# Patient Record
Sex: Female | Born: 1990 | Race: Black or African American | Hispanic: No | Marital: Single | Smoking: Never smoker
Health system: Southern US, Community
[De-identification: ages and names within clinical notes are randomized; demographics above are authoritative.]

## PROBLEM LIST (undated history)

## (undated) DIAGNOSIS — Z789 Other specified health status: Secondary | ICD-10-CM

## (undated) DIAGNOSIS — T783XXA Angioneurotic edema, initial encounter: Secondary | ICD-10-CM

## (undated) HISTORY — DX: Other specified health status: Z78.9

## (undated) HISTORY — PX: LUNG TRANSPLANT, DOUBLE: SHX704

## (undated) HISTORY — PX: ARTHROSCOPIC REPAIR ACL: SUR80

## (undated) HISTORY — DX: Angioneurotic edema, initial encounter: T78.3XXA

---
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
## 2012-09-10 ENCOUNTER — Other Ambulatory Visit: Payer: Self-pay | Admitting: Nurse Practitioner

## 2013-05-05 ENCOUNTER — Encounter: Payer: Self-pay | Admitting: Family Medicine

## 2013-05-05 ENCOUNTER — Ambulatory Visit (INDEPENDENT_AMBULATORY_CARE_PROVIDER_SITE_OTHER): Payer: 59 | Admitting: Family Medicine

## 2013-05-05 VITALS — BP 110/68 | Temp 98.8°F | Ht 66.0 in | Wt 160.4 lb

## 2013-05-05 DIAGNOSIS — J019 Acute sinusitis, unspecified: Secondary | ICD-10-CM

## 2013-05-05 MED ORDER — CEFPROZIL 500 MG PO TABS: 500.0000 mg | ORAL_TABLET | Freq: Two times a day (BID) | ORAL | Status: DC

## 2013-05-05 NOTE — Progress Notes (Signed)
   Subjective:    Patient ID: Madison SaugerJazmaine Decker, female    DOB: 04/12/1991, 22 y.o.   MRN: [id number]  Sinusitis This is a new problem. The current episode started in the past 7 days. The problem is unchanged. There has been no fever. The pain is moderate. Associated symptoms include congestion, headaches and a sore throat. Past treatments include oral decongestants. The treatment provided no relief.   Coughing, nasal discharge, headache, sore throat No high fever No body  achres   Review of Systems  HENT: Positive for congestion and sore throat.   Neurological: Positive for headaches.       Objective:   Physical Exam  Nursing note and vitals reviewed. Constitutional: She appears well-developed.  HENT:  Head: Normocephalic.  Nose: Nose normal.  Mouth/Throat: Oropharynx is clear and moist. No oropharyngeal exudate.  Neck: Neck supple.  Cardiovascular: Normal rate and normal heart sounds.   No murmur heard. Pulmonary/Chest: Effort normal and breath sounds normal. She has no wheezes.  Lymphadenopathy:    She has no cervical adenopathy.  Skin: Skin is warm and dry.          Assessment & Plan:  Sinusitis-antibiotics prescribed warning signs discussed followup if ongoing troubles

## 2013-05-22 ENCOUNTER — Encounter: Payer: Self-pay | Admitting: Nurse Practitioner

## 2013-05-22 ENCOUNTER — Ambulatory Visit (INDEPENDENT_AMBULATORY_CARE_PROVIDER_SITE_OTHER): Payer: 59 | Admitting: Nurse Practitioner

## 2013-05-22 VITALS — BP 94/60 | HR 60 | Ht 65.5 in | Wt 158.0 lb

## 2013-05-22 DIAGNOSIS — Z01419 Encounter for gynecological examination (general) (routine) without abnormal findings: Secondary | ICD-10-CM

## 2013-05-22 DIAGNOSIS — Z23 Encounter for immunization: Secondary | ICD-10-CM

## 2013-05-22 DIAGNOSIS — Z0000 Encounter for general adult medical examination without abnormal findings: Secondary | ICD-10-CM

## 2013-05-22 MED ORDER — NORGESTIMATE-ETH ESTRADIOL 0.25-35 MG-MCG PO TABS: 1.0000 | ORAL_TABLET | Freq: Every day | ORAL | Status: DC

## 2013-05-29 ENCOUNTER — Encounter: Payer: Self-pay | Admitting: Nurse Practitioner

## 2013-05-29 NOTE — Progress Notes (Signed)
   Subjective:    Patient ID: Madison SaugerJazmaine Decker, female    DOB: 11/12/1990, 23 y.o.   MRN: [id number]  HPI presents for her wellness checkup. Is currently off her birth control pills. Regular menses lasting 5 days with the first 2 days very heavy with cramping. Is currently in college studying nursing. No visual problems. Regular dental exams. Has had a total of one sexual partner, none recently. Used condoms.    Review of Systems  Constitutional: Negative for activity change, appetite change and fatigue.  HENT: Negative for dental problem, ear pain, sinus pressure and sore throat.   Respiratory: Negative for cough, chest tightness, shortness of breath and wheezing.   Cardiovascular: Negative for chest pain.  Gastrointestinal: Negative for nausea, vomiting, abdominal pain, diarrhea and constipation.  Genitourinary: Positive for menstrual problem. Negative for dysuria, urgency, frequency, vaginal discharge, difficulty urinating, genital sores and pelvic pain.  Psychiatric/Behavioral: Negative for sleep disturbance and decreased concentration.       Objective:   Physical Exam  Vitals reviewed. Constitutional: She is oriented to person, place, and time. She appears well-developed. No distress.  HENT:  Right Ear: External ear normal.  Left Ear: External ear normal.  Mouth/Throat: Oropharynx is clear and moist.  Neck: Normal range of motion. Neck supple. No tracheal deviation present. No thyromegaly present.  Cardiovascular: Normal rate, regular rhythm and normal heart sounds.  Exam reveals no gallop.   No murmur heard. Pulmonary/Chest: Effort normal and breath sounds normal.  Abdominal: Soft. She exhibits no distension. There is no tenderness.  Musculoskeletal: She exhibits no edema.  Lymphadenopathy:    She has no cervical adenopathy.  Neurological: She is alert and oriented to person, place, and time.  Skin: Skin is warm and dry. No rash noted.  Psychiatric: She has a normal mood and  affect. Her behavior is normal.   GU and breast exams deferred. Patient denies any problems. Defers STD testing.        Assessment & Plan:  Well woman exam  Need for prophylactic vaccination and inoculation against other viral diseases(V04.89) - Plan: HPV vaccine quadravalent 3 dose IM  DUB  Recommend healthy diet and regular exercise. Daily vitamin D and calcium supplementation. Discussed safe sex issues. Meds ordered this encounter  Medications  . norgestimate-ethinyl estradiol (ORTHO-CYCLEN,SPRINTEC,PREVIFEM) 0.25-35 MG-MCG tablet    Sig: Take 1 tablet by mouth daily. Start first Sunday after next period begins    Dispense:  1 Package    Refill:  11    Order Specific Question:  Supervising Provider    Answer:  Merlyn AlbertLUKING, WILLIAM S [[id number]]   Call back if any problems. Next physical in one year.

## 2013-10-17 ENCOUNTER — Emergency Department (HOSPITAL_COMMUNITY): Payer: No Typology Code available for payment source

## 2013-10-17 ENCOUNTER — Encounter (HOSPITAL_COMMUNITY): Payer: Self-pay | Admitting: Emergency Medicine

## 2013-10-17 ENCOUNTER — Emergency Department (HOSPITAL_COMMUNITY)
Admission: EM | Admit: 2013-10-17 | Discharge: 2013-10-17 | Disposition: A | Discharge status: Home / Self Care | Payer: No Typology Code available for payment source | Attending: Emergency Medicine | Admitting: Emergency Medicine

## 2013-10-17 DIAGNOSIS — Y92410 Unspecified street and highway as the place of occurrence of the external cause: Secondary | ICD-10-CM | POA: Insufficient documentation

## 2013-10-17 DIAGNOSIS — S81809A Unspecified open wound, unspecified lower leg, initial encounter: Principal | ICD-10-CM

## 2013-10-17 DIAGNOSIS — Y9389 Activity, other specified: Secondary | ICD-10-CM | POA: Insufficient documentation

## 2013-10-17 DIAGNOSIS — S81009A Unspecified open wound, unspecified knee, initial encounter: Secondary | ICD-10-CM | POA: Insufficient documentation

## 2013-10-17 DIAGNOSIS — Z9889 Other specified postprocedural states: Secondary | ICD-10-CM | POA: Insufficient documentation

## 2013-10-17 DIAGNOSIS — S0993XA Unspecified injury of face, initial encounter: Secondary | ICD-10-CM | POA: Insufficient documentation

## 2013-10-17 DIAGNOSIS — S91009A Unspecified open wound, unspecified ankle, initial encounter: Principal | ICD-10-CM

## 2013-10-17 DIAGNOSIS — Z23 Encounter for immunization: Secondary | ICD-10-CM | POA: Insufficient documentation

## 2013-10-17 DIAGNOSIS — S199XXA Unspecified injury of neck, initial encounter: Secondary | ICD-10-CM

## 2013-10-17 MED ORDER — CEPHALEXIN 500 MG PO CAPS: 500.0000 mg | ORAL_CAPSULE | Freq: Four times a day (QID) | ORAL | Status: DC

## 2013-10-17 MED ORDER — TETANUS-DIPHTH-ACELL PERTUSSIS 5-2.5-18.5 LF-MCG/0.5 IM SUSP
0.5000 mL | Freq: Once | INTRAMUSCULAR | Status: AC
  Administered 2013-10-17: 0.5 mL via INTRAMUSCULAR
  Filled 2013-10-17: qty 0.5

## 2013-10-17 MED ORDER — HYDROCODONE-ACETAMINOPHEN 5-325 MG PO TABS: 1.0000 | ORAL_TABLET | Freq: Four times a day (QID) | ORAL | Status: DC | PRN

## 2013-10-17 MED ORDER — OXYCODONE-ACETAMINOPHEN 5-325 MG PO TABS
1.0000 | ORAL_TABLET | Freq: Once | ORAL | Status: AC
  Administered 2013-10-17: 1 via ORAL
  Filled 2013-10-17: qty 1

## 2013-10-17 NOTE — ED Notes (Signed)
Resident at bedside.  
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
## 2013-10-17 NOTE — ED Notes (Signed)
VS are wnl. NAD noted. Pt given discharge instructions and prescriptions were reviewed. All questions answered. Knee immobilizer applied. Wet to dry dsgs applied to left knee and right hand. Pt discharge home with family by wheelchair.

## 2013-10-17 NOTE — ED Notes (Signed)
Patient transported to X-ray 
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
## 2013-10-18 ENCOUNTER — Telehealth (HOSPITAL_BASED_OUTPATIENT_CLINIC_OR_DEPARTMENT_OTHER): Payer: Self-pay

## 2013-10-18 NOTE — Telephone Encounter (Signed)
Pharmacy calling needs DEA # for Dr Imagene ShellerSteve Walton, Res.  FM has left message for Madison MamW. Cheek who handles sending lists of DEA #'s for Residents and LVM for her to call me.  Pharmacy updated. Pt may bring Rx back to ED.

## 2013-10-18 NOTE — ED Provider Notes (Signed)
I have personally seen and examined the patient.  I have discussed the plan of care with the resident.  I have reviewed the documentation on PMH/FH/Soc. History.  I have reviewed the documentation of the resident and agree.   JacubowiDoug Soutz, MD 10/18/13 [phone]

## 2014-05-23 ENCOUNTER — Other Ambulatory Visit: Payer: Self-pay | Admitting: Nurse Practitioner
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
## 2017-09-20 ENCOUNTER — Ambulatory Visit (INDEPENDENT_AMBULATORY_CARE_PROVIDER_SITE_OTHER): Payer: BLUE CROSS/BLUE SHIELD | Admitting: Nurse Practitioner

## 2017-09-20 ENCOUNTER — Encounter: Payer: Self-pay | Admitting: Nurse Practitioner

## 2017-09-20 VITALS — BP 118/80 | HR 109 | Ht 65.5 in | Wt 173.6 lb

## 2017-09-20 DIAGNOSIS — Z30011 Encounter for initial prescription of contraceptive pills: Secondary | ICD-10-CM | POA: Diagnosis not present

## 2017-09-20 DIAGNOSIS — Z113 Encounter for screening for infections with a predominantly sexual mode of transmission: Secondary | ICD-10-CM

## 2017-09-20 DIAGNOSIS — N921 Excessive and frequent menstruation with irregular cycle: Secondary | ICD-10-CM | POA: Insufficient documentation

## 2017-09-20 MED ORDER — NORETHIN ACE-ETH ESTRAD-FE 1-20 MG-MCG(24) PO TABS: 1.0000 | ORAL_TABLET | Freq: Every day | ORAL | 11 refills | Status: DC

## 2017-09-20 NOTE — Progress Notes (Signed)
Subjective:  Presents to discuss contraceptives. Has very irregular cycles with various flow and length. Her last 2 cycles were heavier and longer than usual. Some cramping and pain at times.  Last normal menses 2 weeks ago. Recently broke up with her partner of 2 years. Found out he had been cheating. Requesting screening for STDs as a precaution. No pelvic pain. Possible slight fever for a few days. White milky discharge, no color or odor. No urinary symptoms.   Objective:   BP 118/80 (BP Location: Left Arm)   Pulse (!) 109   Ht 5' 5.5" (1.664 m)   Wt 173 lb 9.6 oz (78.7 kg)   LMP 09/06/2017   SpO2 98%   BMI 28.45 kg/m  NAD. Alert, oriented. Lungs clear. Heart RRR. Abdomen soft, non tender.   Assessment:   Problem List Items Addressed This Visit      Other   Menorrhagia with irregular cycle    Other Visit Diagnoses    Encounter for initial prescription of contraceptive pills    -  Primary   Screening for STD (sexually transmitted disease)       Relevant Orders   HIV antibody   Hepatitis C antibody   RPR   Chlamydia/Gonococcus/Trichomonas, NAA       Plan:   Meds ordered this encounter  Medications  . Norethindrone Acetate-Ethinyl Estrad-FE (LOESTRIN 24 FE) 1-20 MG-MCG(24) tablet    Sig: Take 1 tablet by mouth daily.    Dispense:  1 Package    Refill:  11    Order Specific Question:   Supervising Provider    Answer:   Riccardo DubinLUKING, WILLIAM S [[id number]]  Reviewed contraceptive options.  Patient chooses to try oc's again. Has taken them without difficulty in the past.  STD screening pending. Warning signs reviewed especially PID symptoms. Reviewed safe sex issues. Recommend wellness exam and PAP.  Call back if any problems with oc's.

## 2017-09-21 LAB — HIV ANTIBODY (ROUTINE TESTING W REFLEX): HIV SCREEN 4TH GENERATION: NONREACTIVE

## 2017-09-21 LAB — HEPATITIS C ANTIBODY

## 2017-09-21 LAB — RPR: RPR: NONREACTIVE

## 2017-09-22 LAB — CHLAMYDIA/GONOCOCCUS/TRICHOMONAS, NAA
CHLAMYDIA BY NAA: POSITIVE — AB
Gonococcus by NAA: POSITIVE — AB
Trich vag by NAA: POSITIVE — AB

## 2017-09-22 LAB — SPECIMEN STATUS REPORT

## 2017-09-23 ENCOUNTER — Other Ambulatory Visit: Payer: Self-pay | Admitting: Nurse Practitioner

## 2017-09-23 MED ORDER — AZITHROMYCIN 250 MG PO TABS: ORAL_TABLET | ORAL | 0 refills | Status: DC

## 2017-09-23 MED ORDER — METRONIDAZOLE 500 MG PO TABS: ORAL_TABLET | ORAL | 0 refills | Status: DC

## 2017-09-23 MED ORDER — ONDANSETRON 4 MG PO TBDP: 4.0000 mg | ORAL_TABLET | Freq: Three times a day (TID) | ORAL | 0 refills | Status: DC | PRN

## 2017-09-23 NOTE — Addendum Note (Signed)
Addended by: Meredith LeedsSUTTON,  L on: 09/23/2017 11:22 AM   Modules accepted: Orders

## 2017-09-28 ENCOUNTER — Ambulatory Visit (INDEPENDENT_AMBULATORY_CARE_PROVIDER_SITE_OTHER): Payer: BLUE CROSS/BLUE SHIELD

## 2017-09-28 VITALS — Temp 99.3°F

## 2017-09-28 DIAGNOSIS — Z202 Contact with and (suspected) exposure to infections with a predominantly sexual mode of transmission: Secondary | ICD-10-CM

## 2017-09-28 MED ORDER — CEFTRIAXONE SODIUM 250 MG IJ SOLR
250.0000 mg | Freq: Once | INTRAMUSCULAR | Status: AC
  Administered 2017-09-28: 250 mg via INTRAMUSCULAR

## 2017-09-28 NOTE — Progress Notes (Signed)
Pt came in today for her rocephin 250 she denies having any pelvic pain or a fever.

## 2018-12-14 ENCOUNTER — Encounter: Payer: Self-pay | Admitting: Women's Health

## 2018-12-14 ENCOUNTER — Ambulatory Visit (INDEPENDENT_AMBULATORY_CARE_PROVIDER_SITE_OTHER): Payer: Self-pay | Admitting: Women's Health

## 2018-12-14 ENCOUNTER — Other Ambulatory Visit: Payer: Self-pay

## 2018-12-14 VITALS — BP 121/79 | HR 86 | Ht 66.0 in | Wt 189.5 lb

## 2018-12-14 DIAGNOSIS — Z3201 Encounter for pregnancy test, result positive: Secondary | ICD-10-CM

## 2018-12-14 DIAGNOSIS — Z3491 Encounter for supervision of normal pregnancy, unspecified, first trimester: Secondary | ICD-10-CM

## 2018-12-14 LAB — POCT URINE PREGNANCY: Preg Test, Ur: POSITIVE — AB

## 2018-12-14 MED ORDER — PROMETHAZINE HCL 25 MG PO TABS: 12.5000 mg | ORAL_TABLET | Freq: Four times a day (QID) | ORAL | 0 refills | Status: DC | PRN

## 2018-12-14 NOTE — Progress Notes (Signed)
   GYN VISIT Patient name: Madison Decker MRN [id number]  Date of birth: 1990-07-17 Chief Complaint:   Possible Pregnancy  History of Present Illness:   Madison Decker is a 28 y.o. G56P0 African American female at [redacted]w[redacted]d by LMP, being seen today for +HPT.  Currently self-pay, has applied for preg mcaid. Nausea, wants meds. Not taking pnv yet. Regular periods.   Patient's last menstrual period was 10/29/2018. Review of Systems:   Pertinent items are noted in HPI Denies fever/chills, dizziness, headaches, visual disturbances, fatigue, shortness of breath, chest pain, abdominal pain, vomiting, abnormal vaginal discharge/itching/odor/irritation, problems with periods, bowel movements, urination, or intercourse unless otherwise stated above.  Pertinent History Reviewed:  Reviewed past medical,surgical, social, obstetrical and family history.  Reviewed problem list, medications and allergies. Physical Assessment:   Vitals:   12/14/18 1418  BP: 121/79  Pulse: 86  Weight: 189 lb 8 oz (86 kg)  Height: 5\' 6"  (1.676 m)  Body mass index is 30.59 kg/m.       Physical Examination:   General appearance: alert, well appearing, and in no distress  Mental status: alert, oriented to person, place, and time  Skin: warm & dry   Cardiovascular: normal heart rate noted  Respiratory: normal respiratory effort, no distress  Abdomen: soft, non-tender   Pelvic: examination not indicated  Extremities: no edema   Results for orders placed or performed in visit on 12/14/18 (from the past 24 hour(s))  POCT urine pregnancy   Collection Time: 12/14/18  2:22 PM  Result Value Ref Range   Preg Test, Ur Positive (A) Negative    Assessment & Plan:  1) [redacted]w[redacted]d pregnant> by LMP, start pnv, rx phenergan, gave info for otc vit b6/unisom, get dating u/s scheduled  Meds:  Meds ordered this encounter  Medications  . promethazine (PHENERGAN) 25 MG tablet    Sig: Take 0.5-1 tablets (12.5-25 mg total) by mouth every 6  (six) hours as needed for nausea or vomiting.    Dispense:  30 tablet    Refill:  0    Order Specific Question:   Supervising Provider    Answer:   Tania Ade H [[id number]]    Orders Placed This Encounter  Procedures  . US OB Comp Less 14 Wks  . POCT urine pregnancy    Return for 1st available, dating u/s.  [place], [place] 12/14/2018 2:44 PM

## 2018-12-14 NOTE — Patient Instructions (Addendum)
Madison Decker, I greatly value your feedback.  If you receive a survey following your visit with [place] today, we appreciate you taking the time to fill it out.  Thanks, Knute Neu, CNM, WHNP-BC  Vitamin B6 (pyridoxine) 25mg  four times a day OR 50mg  twice a day Add Unisom (doxylamine) 12.5mg  (1/2 tab) in the morning, 12.5mg  6-8 hours later, then 25mg  every night if symptoms do not improve with Vitamin B6 alone    First Trimester of Pregnancy The first trimester of pregnancy is from week 1 until the end of week 13 (months 1 through 3). A week after a sperm fertilizes an egg, the egg will implant on the wall of the uterus. This embryo will begin to develop into a baby. Genes from you and your partner will form the baby. The female genes will determine whether the baby will be a boy or a girl. At 6-8 weeks, the eyes and face will be formed, and the heartbeat can be seen on ultrasound. At the end of 12 weeks, all the baby's organs will be formed. Now that you are pregnant, you will want to do everything you can to have a healthy baby. Two of the most important things are to get good prenatal care and to follow your health care provider's instructions. Prenatal care is all the medical care you receive before the baby's birth. This care will help prevent, find, and treat any problems during the pregnancy and childbirth. Body changes during your first trimester Your body goes through many changes during pregnancy. The changes vary from woman to woman.  You may gain or lose a couple of pounds at first.  You may feel sick to your stomach (nauseous) and you may throw up (vomit). If the vomiting is uncontrollable, call your health care provider.  You may tire easily.  You may develop headaches that can be relieved by medicines. All medicines should be approved by your health care provider.  You may urinate more often. Painful urination may mean you have a bladder infection.  You may develop heartburn as a  result of your pregnancy.  You may develop constipation because certain hormones are causing the muscles that push stool through your intestines to slow down.  You may develop hemorrhoids or swollen veins (varicose veins).  Your breasts may begin to grow larger and become tender. Your nipples may stick out more, and the tissue that surrounds them (areola) may become darker.  Your gums may bleed and may be sensitive to brushing and flossing.  Dark spots or blotches (chloasma, mask of pregnancy) may develop on your face. This will likely fade after the baby is born.  Your menstrual periods will stop.  You may have a loss of appetite.  You may develop cravings for certain kinds of food.  You may have changes in your emotions from day to day, such as being excited to be pregnant or being concerned that something may go wrong with the pregnancy and baby.  You may have more vivid and strange dreams.  You may have changes in your hair. These can include thickening of your hair, rapid growth, and changes in texture. Some women also have hair loss during or after pregnancy, or hair that feels dry or thin. Your hair will most likely return to normal after your baby is born.  What to expect at prenatal visits During a routine prenatal visit:  You will be weighed to make sure you and the baby are growing normally.  Your  blood pressure will be taken.  Your abdomen will be measured to track your baby's growth.  The fetal heartbeat will be listened to between weeks 10 and 14 of your pregnancy.  Test results from any previous visits will be discussed.  Your health care provider may ask you:  How you are feeling.  If you are feeling the baby move.  If you have had any abnormal symptoms, such as leaking fluid, bleeding, severe headaches, or abdominal cramping.  If you are using any tobacco products, including cigarettes, chewing tobacco, and electronic cigarettes.  If you have any  questions.  Other tests that may be performed during your first trimester include:  Blood tests to find your blood type and to check for the presence of any previous infections. The tests will also be used to check for low iron levels (anemia) and protein on red blood cells (Rh antibodies). Depending on your risk factors, or if you previously had diabetes during pregnancy, you may have tests to check for high blood sugar that affects pregnant women (gestational diabetes).  Urine tests to check for infections, diabetes, or protein in the urine.  An ultrasound to confirm the proper growth and development of the baby.  Fetal screens for spinal cord problems (spina bifida) and Down syndrome.  HIV (human immunodeficiency virus) testing. Routine prenatal testing includes screening for HIV, unless you choose not to have this test.  You may need other tests to make sure you and the baby are doing well.  Follow these instructions at home: Medicines  Follow your health care provider's instructions regarding medicine use. Specific medicines may be either safe or unsafe to take during pregnancy.  Take a prenatal vitamin that contains at least 600 micrograms (mcg) of folic acid.  If you develop constipation, try taking a stool softener if your health care provider approves. Eating and drinking  Eat a balanced diet that includes fresh fruits and vegetables, whole grains, good sources of protein such as meat, eggs, or tofu, and low-fat dairy. Your health care provider will help you determine the amount of weight gain that is right for you.  Avoid raw meat and uncooked cheese. These carry germs that can cause birth defects in the baby.  Eating four or five small meals rather than three large meals a day may help relieve nausea and vomiting. If you start to feel nauseous, eating a few soda crackers can be helpful. Drinking liquids between meals, instead of during meals, also seems to help ease nausea  and vomiting.  Limit foods that are high in fat and processed sugars, such as fried and sweet foods.  To prevent constipation: ? Eat foods that are high in fiber, such as fresh fruits and vegetables, whole grains, and beans. ? Drink enough fluid to keep your urine clear or pale yellow. Activity  Exercise only as directed by your health care provider. Most women can continue their usual exercise routine during pregnancy. Try to exercise for 30 minutes at least 5 days a week. Exercising will help you: ? Control your weight. ? Stay in shape. ? Be prepared for labor and delivery.  Experiencing pain or cramping in the lower abdomen or lower back is a good sign that you should stop exercising. Check with your health care provider before continuing with normal exercises.  Try to avoid standing for long periods of time. Move your legs often if you must stand in one place for a long time.  Avoid heavy lifting.  Wear  low-heeled shoes and practice good posture.  You may continue to have sex unless your health care provider tells you not to. Relieving pain and discomfort  Wear a good support bra to relieve breast tenderness.  Take warm sitz baths to soothe any pain or discomfort caused by hemorrhoids. Use hemorrhoid cream if your health care provider approves.  Rest with your legs elevated if you have leg cramps or low back pain.  If you develop varicose veins in your legs, wear support hose. Elevate your feet for 15 minutes, 3-4 times a day. Limit salt in your diet. Prenatal care  Schedule your prenatal visits by the twelfth week of pregnancy. They are usually scheduled monthly at first, then more often in the last 2 months before delivery.  Write down your questions. Take them to your prenatal visits.  Keep all your prenatal visits as told by your health care provider. This is important. Safety  Wear your seat belt at all times when driving.  Make a list of emergency phone numbers,  including numbers for family, friends, the hospital, and police and fire departments. General instructions  Ask your health care provider for a referral to a local prenatal education class. Begin classes no later than the beginning of month 6 of your pregnancy.  Ask for help if you have counseling or nutritional needs during pregnancy. Your health care provider can offer advice or refer you to specialists for help with various needs.  Do not use hot tubs, steam rooms, or saunas.  Do not douche or use tampons or scented sanitary pads.  Do not cross your legs for long periods of time.  Avoid cat litter boxes and soil used by cats. These carry germs that can cause birth defects in the baby and possibly loss of the fetus by miscarriage or stillbirth.  Avoid all smoking, herbs, alcohol, and medicines not prescribed by your health care provider. Chemicals in these products affect the formation and growth of the baby.  Do not use any products that contain nicotine or tobacco, such as cigarettes and e-cigarettes. If you need help quitting, ask your health care provider. You may receive counseling support and other resources to help you quit.  Schedule a dentist appointment. At home, brush your teeth with a soft toothbrush and be gentle when you floss. Contact a health care provider if:  You have dizziness.  You have mild pelvic cramps, pelvic pressure, or nagging pain in the abdominal area.  You have persistent nausea, vomiting, or diarrhea.  You have a bad smelling vaginal discharge.  You have pain when you urinate.  You notice increased swelling in your face, hands, legs, or ankles.  You are exposed to fifth disease or chickenpox.  You are exposed to [place] measles (rubella) and have never had it. Get help right away if:  You have a fever.  You are leaking fluid from your vagina.  You have spotting or bleeding from your vagina.  You have severe abdominal cramping or pain.   You have rapid weight gain or loss.  You vomit blood or material that looks like coffee grounds.  You develop a severe headache.  You have shortness of breath.  You have any kind of trauma, such as from a fall or a car accident. Summary  The first trimester of pregnancy is from week 1 until the end of week 13 (months 1 through 3).  Your body goes through many changes during pregnancy. The changes vary from woman to woman.  You will have routine prenatal visits. During those visits, your health care provider will examine you, discuss any test results you may have, and talk with you about how you are feeling. This information is not intended to replace advice given to you by your health care provider. Make sure you discuss any questions you have with your health care provider. Document Released: 03/24/2001 Document Revised: 03/11/2016 Document Reviewed: 03/11/2016 [employer] Interactive Patient Education  2017 [employer]

## 2018-12-16 ENCOUNTER — Ambulatory Visit (INDEPENDENT_AMBULATORY_CARE_PROVIDER_SITE_OTHER): Payer: Medicaid Other

## 2018-12-16 ENCOUNTER — Other Ambulatory Visit: Payer: Self-pay

## 2018-12-16 DIAGNOSIS — Z3491 Encounter for supervision of normal pregnancy, unspecified, first trimester: Secondary | ICD-10-CM

## 2018-12-16 DIAGNOSIS — Z3A01 Less than 8 weeks gestation of pregnancy: Secondary | ICD-10-CM | POA: Diagnosis not present

## 2018-12-16 DIAGNOSIS — O3680X0 Pregnancy with inconclusive fetal viability, not applicable or unspecified: Secondary | ICD-10-CM | POA: Diagnosis not present

## 2018-12-16 NOTE — Progress Notes (Signed)
[place] 6+6 wks,single IUP w/ys,positive fht 121 bpm,normal ovaries bilat,crl 7.7 mm

## 2019-01-26 ENCOUNTER — Other Ambulatory Visit: Payer: Self-pay | Admitting: Obstetrics & Gynecology

## 2019-01-26 DIAGNOSIS — Z3682 Encounter for antenatal screening for nuchal translucency: Secondary | ICD-10-CM

## 2019-01-27 ENCOUNTER — Other Ambulatory Visit (HOSPITAL_COMMUNITY)
Admission: RE | Admit: 2019-01-27 | Discharge: 2019-01-27 | Disposition: A | Discharge status: Home / Self Care | Payer: Medicaid Other | Source: Ambulatory Visit | Attending: Obstetrics & Gynecology | Admitting: Obstetrics & Gynecology

## 2019-01-27 ENCOUNTER — Other Ambulatory Visit: Payer: Self-pay

## 2019-01-27 ENCOUNTER — Ambulatory Visit (INDEPENDENT_AMBULATORY_CARE_PROVIDER_SITE_OTHER): Payer: Medicaid Other

## 2019-01-27 ENCOUNTER — Encounter: Payer: Self-pay | Admitting: Women's Health

## 2019-01-27 ENCOUNTER — Ambulatory Visit (INDEPENDENT_AMBULATORY_CARE_PROVIDER_SITE_OTHER): Payer: Medicaid Other | Admitting: Women's Health

## 2019-01-27 ENCOUNTER — Ambulatory Visit: Payer: Medicaid Other | Admitting: *Deleted

## 2019-01-27 VITALS — BP 113/84 | HR 84 | Wt 184.0 lb

## 2019-01-27 DIAGNOSIS — Z130 Encounter for screening for diseases of the blood and blood-forming organs and certain disorders involving the immune mechanism: Secondary | ICD-10-CM

## 2019-01-27 DIAGNOSIS — Z3A12 12 weeks gestation of pregnancy: Secondary | ICD-10-CM

## 2019-01-27 DIAGNOSIS — Z1379 Encounter for other screening for genetic and chromosomal anomalies: Secondary | ICD-10-CM

## 2019-01-27 DIAGNOSIS — Z3401 Encounter for supervision of normal first pregnancy, first trimester: Secondary | ICD-10-CM | POA: Diagnosis present

## 2019-01-27 DIAGNOSIS — Z23 Encounter for immunization: Secondary | ICD-10-CM | POA: Diagnosis not present

## 2019-01-27 DIAGNOSIS — Z363 Encounter for antenatal screening for malformations: Secondary | ICD-10-CM

## 2019-01-27 DIAGNOSIS — Z1371 Encounter for nonprocreative screening for genetic disease carrier status: Secondary | ICD-10-CM

## 2019-01-27 DIAGNOSIS — Z124 Encounter for screening for malignant neoplasm of cervix: Secondary | ICD-10-CM | POA: Diagnosis present

## 2019-01-27 DIAGNOSIS — Z1389 Encounter for screening for other disorder: Secondary | ICD-10-CM

## 2019-01-27 DIAGNOSIS — Z3682 Encounter for antenatal screening for nuchal translucency: Secondary | ICD-10-CM

## 2019-01-27 DIAGNOSIS — Z3400 Encounter for supervision of normal first pregnancy, unspecified trimester: Secondary | ICD-10-CM | POA: Insufficient documentation

## 2019-01-27 DIAGNOSIS — Z113 Encounter for screening for infections with a predominantly sexual mode of transmission: Secondary | ICD-10-CM

## 2019-01-27 DIAGNOSIS — Z331 Pregnant state, incidental: Secondary | ICD-10-CM

## 2019-01-27 DIAGNOSIS — Z36 Encounter for antenatal screening for chromosomal anomalies: Secondary | ICD-10-CM

## 2019-01-27 LAB — POCT URINALYSIS DIPSTICK OB
Blood, UA: NEGATIVE
Glucose, UA: NEGATIVE
Ketones, UA: NEGATIVE
Leukocytes, UA: NEGATIVE
Nitrite, UA: NEGATIVE
POC,PROTEIN,UA: NEGATIVE

## 2019-01-27 MED ORDER — BLOOD PRESSURE MONITOR MISC: 0 refills | Status: DC

## 2019-01-27 NOTE — Patient Instructions (Signed)
Madison Decker, I greatly value your feedback.  If you receive a survey following your visit with [place] today, we appreciate you taking the time to fill it out.  Thanks, Knute Neu, CNM, [facility]  [place]!!! It is now [place] [facility] ([place], [place] [address]) Entrance located off of [address] parking   Nausea & Vomiting  Have saltine crackers or pretzels by your bed and eat a few bites before you raise your head out of bed in the morning  Eat small frequent meals throughout the day instead of large meals  Drink plenty of fluids throughout the day to stay hydrated, just don't drink a lot of fluids with your meals.  This can make your stomach fill up faster making you feel sick  Do not brush your teeth right after you eat  Products with real ginger are good for nausea, like ginger ale and ginger hard candy Make sure it says made with real ginger!  Sucking on sour candy like lemon heads is also good for nausea  If your prenatal vitamins make you nauseated, take them at night so you will sleep through the nausea  Sea Bands  If you feel like you need medicine for the nausea & vomiting please let us know  If you are unable to keep any fluids or food down please let us know   Constipation  Drink plenty of fluid, preferably water, throughout the day  Eat foods high in fiber such as fruits, vegetables, and grains  Exercise, such as walking, is a good way to keep your bowels regular  Drink warm fluids, especially warm prune juice, or decaf coffee  Eat a 1/2 cup of real oatmeal (not instant), 1/2 cup applesauce, and 1/2-1 cup warm prune juice every day  If needed, you may take Colace (docusate sodium) stool softener once or twice a day to help keep the stool soft.   If you still are having problems with constipation, you may take Miralax once daily as needed to help keep your bowels regular.   Home Blood  Pressure Monitoring for Patients   Your provider has recommended that you check your blood pressure (BP) at least once a week at home. If you do not have a blood pressure cuff at home, one will be provided for you. Contact your provider if you have not received your monitor within 1 week.   Helpful Tips for Accurate Home Blood Pressure Checks  . Don't smoke, exercise, or drink caffeine 30 minutes before checking your BP . Use the restroom before checking your BP (a full bladder can raise your pressure) . Relax in a comfortable upright chair . Feet on the ground . Left arm resting comfortably on a flat surface at the level of your heart . Legs uncrossed . Back supported . Sit quietly and don't talk . Place the cuff on your bare arm . Adjust snuggly, so that only two fingertips can fit between your skin and the top of the cuff . Check 2 readings separated by at least one minute . Keep a log of your BP readings . For a visual, please reference this diagram: http://ccnc.care/bpdiagram  Provider Name: [facility]     Phone: [phone]  Zone 1: ALL CLEAR  Continue to monitor your symptoms:  . BP reading is less than 140 (top number) or less than 90 (bottom number)  . No right upper stomach pain .  No headaches or seeing spots . No feeling nauseated or throwing up . No swelling in face and hands  Zone 2: CAUTION Call your doctor's office for any of the following:  . BP reading is greater than 140 (top number) or greater than 90 (bottom number)  . Stomach pain under your ribs in the middle or right side . Headaches or seeing spots . Feeling nauseated or throwing up . Swelling in face and hands  Zone 3: EMERGENCY  Seek immediate medical care if you have any of the following:  . BP reading is greater than160 (top number) or greater than 110 (bottom number) . Severe headaches not improving with Tylenol . Serious difficulty catching your breath . Any worsening symptoms from Zone  2    First Trimester of Pregnancy The first trimester of pregnancy is from week 1 until the end of week 12 (months 1 through 3). A week after a sperm fertilizes an egg, the egg will implant on the wall of the uterus. This embryo will begin to develop into a baby. Genes from you and your partner are forming the baby. The female genes determine whether the baby is a boy or a girl. At 6-8 weeks, the eyes and face are formed, and the heartbeat can be seen on ultrasound. At the end of 12 weeks, all the baby's organs are formed.  Now that you are pregnant, you will want to do everything you can to have a healthy baby. Two of the most important things are to get good prenatal care and to follow your health care provider's instructions. Prenatal care is all the medical care you receive before the baby's birth. This care will help prevent, find, and treat any problems during the pregnancy and childbirth. BODY CHANGES Your body goes through many changes during pregnancy. The changes vary from woman to woman.   You may gain or lose a couple of pounds at first.  You may feel sick to your stomach (nauseous) and throw up (vomit). If the vomiting is uncontrollable, call your health care provider.  You may tire easily.  You may develop headaches that can be relieved by medicines approved by your health care provider.  You may urinate more often. Painful urination may mean you have a bladder infection.  You may develop heartburn as a result of your pregnancy.  You may develop constipation because certain hormones are causing the muscles that push waste through your intestines to slow down.  You may develop hemorrhoids or swollen, bulging veins (varicose veins).  Your breasts may begin to grow larger and become tender. Your nipples may stick out more, and the tissue that surrounds them (areola) may become darker.  Your gums may bleed and may be sensitive to brushing and flossing.  Dark spots or blotches  (chloasma, mask of pregnancy) may develop on your face. This will likely fade after the baby is born.  Your menstrual periods will stop.  You may have a loss of appetite.  You may develop cravings for certain kinds of food.  You may have changes in your emotions from day to day, such as being excited to be pregnant or being concerned that something may go wrong with the pregnancy and baby.  You may have more vivid and strange dreams.  You may have changes in your hair. These can include thickening of your hair, rapid growth, and changes in texture. Some women also have hair loss during or after pregnancy, or hair that feels dry  or thin. Your hair will most likely return to normal after your baby is born. WHAT TO EXPECT AT YOUR PRENATAL VISITS During a routine prenatal visit:  You will be weighed to make sure you and the baby are growing normally.  Your blood pressure will be taken.  Your abdomen will be measured to track your baby's growth.  The fetal heartbeat will be listened to starting around week 10 or 12 of your pregnancy.  Test results from any previous visits will be discussed. Your health care provider may ask you:  How you are feeling.  If you are feeling the baby move.  If you have had any abnormal symptoms, such as leaking fluid, bleeding, severe headaches, or abdominal cramping.  If you have any questions. Other tests that may be performed during your first trimester include:  Blood tests to find your blood type and to check for the presence of any previous infections. They will also be used to check for low iron levels (anemia) and Rh antibodies. Later in the pregnancy, blood tests for diabetes will be done along with other tests if problems develop.  Urine tests to check for infections, diabetes, or protein in the urine.  An ultrasound to confirm the proper growth and development of the baby.  An amniocentesis to check for possible genetic problems.  Fetal  screens for spina bifida and Down syndrome.  You may need other tests to make sure you and the baby are doing well. HOME CARE INSTRUCTIONS  Medicines  Follow your health care provider's instructions regarding medicine use. Specific medicines may be either safe or unsafe to take during pregnancy.  Take your prenatal vitamins as directed.  If you develop constipation, try taking a stool softener if your health care provider approves. Diet  Eat regular, well-balanced meals. Choose a variety of foods, such as meat or vegetable-based protein, fish, milk and low-fat dairy products, vegetables, fruits, and whole grain breads and cereals. Your health care provider will help you determine the amount of weight gain that is right for you.  Avoid raw meat and uncooked cheese. These carry germs that can cause birth defects in the baby.  Eating four or five small meals rather than three large meals a day may help relieve nausea and vomiting. If you start to feel nauseous, eating a few soda crackers can be helpful. Drinking liquids between meals instead of during meals also seems to help nausea and vomiting.  If you develop constipation, eat more high-fiber foods, such as fresh vegetables or fruit and whole grains. Drink enough fluids to keep your urine clear or pale yellow. Activity and Exercise  Exercise only as directed by your health care provider. Exercising will help you:  Control your weight.  Stay in shape.  Be prepared for labor and delivery.  Experiencing pain or cramping in the lower abdomen or low back is a good sign that you should stop exercising. Check with your health care provider before continuing normal exercises.  Try to avoid standing for long periods of time. Move your legs often if you must stand in one place for a long time.  Avoid heavy lifting.  Wear low-heeled shoes, and practice good posture.  You may continue to have sex unless your health care provider directs you  otherwise. Relief of Pain or Discomfort  Wear a good support bra for breast tenderness.    Take warm sitz baths to soothe any pain or discomfort caused by hemorrhoids. Use hemorrhoid cream if your  health care provider approves.    Rest with your legs elevated if you have leg cramps or low back pain.  If you develop varicose veins in your legs, wear support hose. Elevate your feet for 15 minutes, 3-4 times a day. Limit salt in your diet. Prenatal Care  Schedule your prenatal visits by the twelfth week of pregnancy. They are usually scheduled monthly at first, then more often in the last 2 months before delivery.  Write down your questions. Take them to your prenatal visits.  Keep all your prenatal visits as directed by your health care provider. Safety  Wear your seat belt at all times when driving.  Make a list of emergency phone numbers, including numbers for family, friends, the hospital, and police and fire departments. General Tips  Ask your health care provider for a referral to a local prenatal education class. Begin classes no later than at the beginning of month 6 of your pregnancy.  Ask for help if you have counseling or nutritional needs during pregnancy. Your health care provider can offer advice or refer you to specialists for help with various needs.  Do not use hot tubs, steam rooms, or saunas.  Do not douche or use tampons or scented sanitary pads.  Do not cross your legs for long periods of time.  Avoid cat litter boxes and soil used by cats. These carry germs that can cause birth defects in the baby and possibly loss of the fetus by miscarriage or stillbirth.  Avoid all smoking, herbs, alcohol, and medicines not prescribed by your health care provider. Chemicals in these affect the formation and growth of the baby.  Schedule a dentist appointment. At home, brush your teeth with a soft toothbrush and be gentle when you floss. SEEK MEDICAL CARE IF:   You have  dizziness.  You have mild pelvic cramps, pelvic pressure, or nagging pain in the abdominal area.  You have persistent nausea, vomiting, or diarrhea.  You have a bad smelling vaginal discharge.  You have pain with urination.  You notice increased swelling in your face, hands, legs, or ankles. SEEK IMMEDIATE MEDICAL CARE IF:   You have a fever.  You are leaking fluid from your vagina.  You have spotting or bleeding from your vagina.  You have severe abdominal cramping or pain.  You have rapid weight gain or loss.  You vomit blood or material that looks like coffee grounds.  You are exposed to [place] measles and have never had them.  You are exposed to fifth disease or chickenpox.  You develop a severe headache.  You have shortness of breath.  You have any kind of trauma, such as from a fall or a car accident. Document Released: 03/24/2001 Document Revised: 08/14/2013 Document Reviewed: 02/07/2013 [facility] Patient Information 2015 [place], [place]. This information is not intended to replace advice given to you by your health care provider. Make sure you discuss any questions you have with your health care provider.  Coronavirus (COVID-19) Are you at risk?  Are you at risk for the Coronavirus (COVID-19)?  To be considered HIGH RISK for Coronavirus (COVID-19), you have to meet the following criteria:  . Traveled to [place], [place], [place], [place] or [place]; or in the [place] to [place], [place], [place], or [place]; and have fever, cough, and shortness of breath within the last 2 weeks of travel OR . Been in close contact with a person diagnosed with COVID-19 within the last 2 weeks and  have fever, cough, and shortness of breath . IF YOU DO NOT MEET THESE CRITERIA, YOU ARE CONSIDERED LOW RISK FOR COVID-19.  What to do if you are HIGH RISK for COVID-19?  Marland Kitchen If you are having a medical emergency, call 911. . Seek medical care right away. Before you go to a  doctor's office, urgent care or emergency department, call ahead and tell them about your recent travel, contact with someone diagnosed with COVID-19, and your symptoms. You should receive instructions from your physician's office regarding next steps of care.  . When you arrive at healthcare provider, tell the healthcare staff immediately you have returned from visiting [place], [place], [place], [place] or [place]; or traveled in the [place] to [place], [place], [place], or [place]; in the last two weeks or you have been in close contact with a person diagnosed with COVID-19 in the last 2 weeks.   . Tell the health care staff about your symptoms: fever, cough and shortness of breath. . After you have been seen by a medical provider, you will be either: o Tested for (COVID-19) and discharged home on quarantine except to seek medical care if symptoms worsen, and asked to  - Stay home and avoid contact with others until you get your results (4-5 days)  - Avoid travel on public transportation if possible (such as bus, train, or airplane) or o Sent to the Emergency Department by EMS for evaluation, COVID-19 testing, and possible admission depending on your condition and test results.  What to do if you are LOW RISK for COVID-19?  Reduce your risk of any infection by using the same precautions used for avoiding the common cold or flu:  Marland Kitchen Wash your hands often with soap and warm water for at least 20 seconds.  If soap and water are not readily available, use an alcohol-based hand sanitizer with at least 60% alcohol.  . If coughing or sneezing, cover your mouth and nose by coughing or sneezing into the elbow areas of your shirt or coat, into a tissue or into your sleeve (not your hands). . Avoid shaking hands with others and consider head nods or verbal greetings only. . Avoid touching your eyes, nose, or mouth with unwashed hands.  . Avoid close contact with people who are sick. . Avoid  places or events with large numbers of people in one location, like concerts or sporting events. . Carefully consider travel plans you have or are making. . If you are planning any travel outside or inside the [place], visit the CDC's Travelers' Health webpage for the latest health notices. . If you have some symptoms but not all symptoms, continue to monitor at home and seek medical attention if your symptoms worsen. . If you are having a medical emergency, call 911.   [place] / e-Visit: [email or web address]         [facility]: [facility]: [id number]                   [facility]: [phone]

## 2019-01-27 NOTE — Progress Notes (Signed)
INITIAL OBSTETRICAL VISIT Patient name: Madison Decker MRN [id number]  Date of birth: 04-01-1991 Chief Complaint:   Initial Prenatal Visit (nt/it)  History of Present Illness:   Madison Decker is a 28 y.o. G1P0 African American female at [redacted]w[redacted]d by LMP c/w 6wk u/s, with an Estimated Date of Delivery: 08/05/19 being seen today for her initial obstetrical visit.   Her obstetrical history is significant for primigravida.   Today she reports no complaints.  Patient's last menstrual period was 10/29/2018. Review of Systems:   Pertinent items are noted in HPI Denies cramping/contractions, leakage of fluid, vaginal bleeding, abnormal vaginal discharge w/ itching/odor/irritation, headaches, visual changes, shortness of breath, chest pain, abdominal pain, severe nausea/vomiting, or problems with urination or bowel movements unless otherwise stated above.  Pertinent History Reviewed:  Reviewed past medical,surgical, social, obstetrical and family history.  Reviewed problem list, medications and allergies. OB History  Gravida Para Term Preterm AB Living  1            SAB TAB Ectopic Multiple Live Births               # Outcome Date GA Lbr Len/2nd Weight Sex Delivery Anes PTL Lv  1 Current            Physical Assessment:   Vitals:   01/27/19 1044  BP: 113/84  Pulse: 84  Weight: 184 lb (83.5 kg)  Body mass index is 29.7 kg/m.       Physical Examination by Camelia Eng, SNM  General appearance - well appearing, and in no distress  Mental status - alert, oriented to person, place, and time  Psych:  She has a normal mood and affect  Skin - warm and dry, normal color, no suspicious lesions noted  Chest - effort normal, all lung fields clear to auscultation bilaterally  Heart - normal rate and regular rhythm  Abdomen - soft, nontender  Extremities:  No swelling or varicosities noted  Pelvic - VULVA: normal appearing vulva with no masses, tenderness or lesions  VAGINA: normal appearing  vagina with normal color and discharge, no lesions  CERVIX: normal appearing cervix without discharge or lesions, no CMT  Thin prep pap is done w/ HR HPV cotesting  TODAY'S NT [place] 12+6 wks,measurements c/w dates,normal ovaries bilat,posterior placenta,crl 68.71 mm,NB present,NT 1.3 mm,fhr 160 bpm  Results for orders placed or performed in visit on 01/27/19 (from the past 24 hour(s))  POC Urinalysis Dipstick OB   Collection Time: 01/27/19 10:59 AM  Result Value Ref Range   Color, UA     Clarity, UA     Glucose, UA Negative Negative   Bilirubin, UA     Ketones, UA neg    Spec Grav, UA     Blood, UA neg    pH, UA     POC,PROTEIN,UA Negative Negative, Trace, Small (1+), Moderate (2+), Large (3+), 4+   Urobilinogen, UA     Nitrite, UA neg    Leukocytes, UA Negative Negative   Appearance     Odor      Assessment & Plan:  1) Low-Risk Pregnancy G1P0 at [redacted]w[redacted]d with an Estimated Date of Delivery: 08/05/19   2) Initial OB visit  Meds:  Meds ordered this encounter  Medications  . Blood Pressure Monitor MISC    Sig: For regular home bp monitoring during pregnancy    Dispense:  1 each    Refill:  0    Z34.00    Initial labs obtained Continue prenatal  vitamins Reviewed n/v relief measures and warning s/s to report Reviewed recommended weight gain based on pre-gravid BMI Encouraged well-balanced diet Genetic Screening discussed: requested nt/it, maternit21 Cystic fibrosis, SMA, Fragile X screening discussed requested Ultrasound discussed; fetal survey: requested CCNC completed>PCM not here, form faxed The nature of [place] for [employer] with multiple MDs and other Advanced Practice Providers was explained to patient; also emphasized that fellows, residents, and students are part of our team. Does not have home bp cuff. Rx faxed to [facility]. Check bp weekly, let us know if >140/90.   Follow-up: Return in about 6 weeks (around 03/10/2019) for LROB, 2nd IT, BH:ALPFXTK, in  person, CNM.   Orders Placed This Encounter  Procedures  . Urine Culture  . US OB Comp + 14 Wk  . Flu Vaccine QUAD 36+ mos IM  . Obstetric Panel, Including HIV  . Urinalysis, Routine w reflex microscopic  . Sickle cell screen  . Integrated 1  . MaterniT 21 plus Core, Blood  . Inheritest Core(CF97,SMA,FraX)  . POC Urinalysis Dipstick OB    Roma Schanz CNM, [facility] 01/27/2019 11:37 AM

## 2019-01-27 NOTE — Progress Notes (Signed)
[place] 12+6 wks,measurements c/w dates,normal ovaries bilat,posterior placenta,crl 68.71 mm,NB present,NT 1.3 mm,fhr 160 bpm

## 2019-01-28 LAB — OBSTETRIC PANEL, INCLUDING HIV
Antibody Screen: NEGATIVE
Basophils Absolute: 0 10*3/uL (ref 0.0–0.2)
Basos: 0 %
EOS (ABSOLUTE): 0 10*3/uL (ref 0.0–0.4)
Eos: 0 %
HIV Screen 4th Generation wRfx: NONREACTIVE
Hematocrit: 40.1 % (ref 34.0–46.6)
Hemoglobin: 13.9 g/dL (ref 11.1–15.9)
Hepatitis B Surface Ag: NEGATIVE
Immature Grans (Abs): 0 10*3/uL (ref 0.0–0.1)
Immature Granulocytes: 0 %
Lymphocytes Absolute: 1.2 10*3/uL (ref 0.7–3.1)
Lymphs: 21 %
MCH: 29 pg (ref 26.6–33.0)
MCHC: 34.7 g/dL (ref 31.5–35.7)
MCV: 84 fL (ref 79–97)
Monocytes Absolute: 0.5 10*3/uL (ref 0.1–0.9)
Monocytes: 9 %
Neutrophils Absolute: 3.9 10*3/uL (ref 1.4–7.0)
Neutrophils: 70 %
Platelets: 226 10*3/uL (ref 150–450)
RBC: 4.79 x10E6/uL (ref 3.77–5.28)
RDW: 12.6 % (ref 11.7–15.4)
RPR Ser Ql: NONREACTIVE
Rh Factor: POSITIVE
Rubella Antibodies, IGG: 2.37 index (ref >0.99)
WBC: 5.7 10*3/uL (ref 3.4–10.8)

## 2019-01-29 LAB — URINE CULTURE

## 2019-01-29 LAB — SPECIMEN STATUS REPORT

## 2019-01-29 LAB — SICKLE CELL SCREEN: Sickle Cell Screen: NEGATIVE

## 2019-02-02 LAB — MATERNIT 21 PLUS CORE, BLOOD
Fetal Fraction: 6
Result (T21): NEGATIVE
Trisomy 13 (Patau syndrome): NEGATIVE
Trisomy 18 (Edwards syndrome): NEGATIVE
Trisomy 21 (Down syndrome): NEGATIVE

## 2019-02-02 LAB — CYTOLOGY - PAP
Chlamydia: NEGATIVE
Comment: NEGATIVE
Comment: NEGATIVE
Comment: NORMAL
Diagnosis: NEGATIVE
High risk HPV: NEGATIVE
Neisseria Gonorrhea: NEGATIVE

## 2019-02-03 LAB — INHERITEST CORE(CF97,SMA,FRAX)

## 2019-03-13 ENCOUNTER — Encounter: Payer: Self-pay | Admitting: Women's Health

## 2019-03-13 ENCOUNTER — Ambulatory Visit (INDEPENDENT_AMBULATORY_CARE_PROVIDER_SITE_OTHER): Payer: Medicaid Other

## 2019-03-13 ENCOUNTER — Ambulatory Visit (INDEPENDENT_AMBULATORY_CARE_PROVIDER_SITE_OTHER): Payer: Medicaid Other | Admitting: Women's Health

## 2019-03-13 ENCOUNTER — Other Ambulatory Visit: Payer: Self-pay

## 2019-03-13 VITALS — BP 115/75 | HR 80 | Wt 182.0 lb

## 2019-03-13 DIAGNOSIS — Z3401 Encounter for supervision of normal first pregnancy, first trimester: Secondary | ICD-10-CM

## 2019-03-13 DIAGNOSIS — Z3A19 19 weeks gestation of pregnancy: Secondary | ICD-10-CM

## 2019-03-13 DIAGNOSIS — Z363 Encounter for antenatal screening for malformations: Secondary | ICD-10-CM

## 2019-03-13 DIAGNOSIS — Z1379 Encounter for other screening for genetic and chromosomal anomalies: Secondary | ICD-10-CM

## 2019-03-13 DIAGNOSIS — Z3402 Encounter for supervision of normal first pregnancy, second trimester: Secondary | ICD-10-CM

## 2019-03-13 NOTE — Patient Instructions (Signed)
Madison Decker, I greatly value your feedback.  If you receive a survey following your visit with [place] today, we appreciate you taking the time to fill it out.  Thanks, Madison Decker, CNM, [facility]  [place]!!! It is now [place] at [facility] ([place], [place] [address]) Entrance located off of [address] parking   Go to [email or web address] to register for FREE online childbirth classes  [place] Pediatricians/Family Doctors:  [facility] [place] [phone]                 [place] [phone] (usually not accepting new patients unless you have family there already, you are always welcome to call and ask)       [facility] Department [phone]       [facility] Pediatricians/Family Doctors:   [facility]: [phone]  [facility]: [phone]  [facility]: [place] Doctors:   [facility]: [facility]: [place]:  [place]: [phone]    Home Blood Pressure Monitoring for Patients   Your provider has recommended that you check your blood pressure (BP) at least once a week at home. If you do not have a blood pressure cuff at home, one will be provided for you. Contact your provider if you have not received your monitor within 1 week.   Helpful Tips for Accurate Home Blood Pressure Checks   Don't smoke, exercise, or drink caffeine 30 minutes before checking your BP  Use the restroom before checking your BP (a full bladder can raise your pressure)  Relax in a comfortable upright chair  Feet on the ground  Left arm resting comfortably on a flat surface at the level of your heart  Legs uncrossed  Back supported  Sit quietly and don't talk  Place the cuff  on your bare arm  Adjust snuggly, so that only two fingertips can fit between your skin and the top of the cuff  Check 2 readings separated by at least one minute  Keep a log of your BP readings  For a visual, please reference this diagram: http://ccnc.care/bpdiagram  Provider Name: [facility]     Phone: [phone]  Zone 1: ALL CLEAR  Continue to monitor your symptoms:   BP reading is less than 140 (top number) or less than 90 (bottom number)   No right upper stomach pain  No headaches or seeing spots  No feeling nauseated or throwing up  No swelling in face and hands  Zone 2: CAUTION Call your doctor's office for any of the following:   BP reading is greater than 140 (top number) or greater than 90 (bottom number)   Stomach pain under your ribs in the middle or right side  Headaches or seeing spots  Feeling nauseated or throwing up  Swelling in face and hands  Zone 3: EMERGENCY  Seek immediate medical care if you have any of the following:   BP reading is greater than160 (top number) or greater than 110 (bottom number)  Severe headaches not improving with Tylenol  Serious difficulty catching your breath  Any worsening symptoms from Zone 2     Second Trimester of Pregnancy The second trimester is from week 14 through week 27 (months 4 through 6). The second trimester is often  a time when you feel your best. Your body has adjusted to being pregnant, and you begin to feel better physically. Usually, morning sickness has lessened or quit completely, you may have more energy, and you may have an increase in appetite. The second trimester is also a time when the fetus is growing rapidly. At the end of the sixth month, the fetus is about 9 inches long and weighs about 1 pounds. You will likely begin to feel the baby move (quickening) between 16 and 20 weeks of pregnancy. Body changes during your second trimester Your body continues to go through many changes  during your second trimester. The changes vary from woman to woman.  Your weight will continue to increase. You will notice your lower abdomen bulging out.  You may begin to get stretch marks on your hips, abdomen, and breasts.  You may develop headaches that can be relieved by medicines. The medicines should be approved by your health care provider.  You may urinate more often because the fetus is pressing on your bladder.  You may develop or continue to have heartburn as a result of your pregnancy.  You may develop constipation because certain hormones are causing the muscles that push waste through your intestines to slow down.  You may develop hemorrhoids or swollen, bulging veins (varicose veins).  You may have back pain. This is caused by: ? Weight gain. ? Pregnancy hormones that are relaxing the joints in your pelvis. ? A shift in weight and the muscles that support your balance.  Your breasts will continue to grow and they will continue to become tender.  Your gums may bleed and may be sensitive to brushing and flossing.  Dark spots or blotches (chloasma, mask of pregnancy) may develop on your face. This will likely fade after the baby is born.  A dark line from your belly button to the pubic area (linea nigra) may appear. This will likely fade after the baby is born.  You may have changes in your hair. These can include thickening of your hair, rapid growth, and changes in texture. Some women also have hair loss during or after pregnancy, or hair that feels dry or thin. Your hair will most likely return to normal after your baby is born.  What to expect at prenatal visits During a routine prenatal visit:  You will be weighed to make sure you and the fetus are growing normally.  Your blood pressure will be taken.  Your abdomen will be measured to track your baby's growth.  The fetal heartbeat will be listened to.  Any test results from the previous visit will be  discussed.  Your health care provider may ask you:  How you are feeling.  If you are feeling the baby move.  If you have had any abnormal symptoms, such as leaking fluid, bleeding, severe headaches, or abdominal cramping.  If you are using any tobacco products, including cigarettes, chewing tobacco, and electronic cigarettes.  If you have any questions.  Other tests that may be performed during your second trimester include:  Blood tests that check for: ? Low iron levels (anemia). ? High blood sugar that affects pregnant women (gestational diabetes) between 66 and 28 weeks. ? Rh antibodies. This is to check for a protein on red blood cells (Rh factor).  Urine tests to check for infections, diabetes, or protein in the urine.  An ultrasound to confirm the proper growth and development of the baby.  An amniocentesis to check  for possible genetic problems.  Fetal screens for spina bifida and Down syndrome.  HIV (human immunodeficiency virus) testing. Routine prenatal testing includes screening for HIV, unless you choose not to have this test.  Follow these instructions at home: Medicines  Follow your health care provider's instructions regarding medicine use. Specific medicines may be either safe or unsafe to take during pregnancy.  Take a prenatal vitamin that contains at least 600 micrograms (mcg) of folic acid.  If you develop constipation, try taking a stool softener if your health care provider approves. Eating and drinking  Eat a balanced diet that includes fresh fruits and vegetables, whole grains, good sources of protein such as meat, eggs, or tofu, and low-fat dairy. Your health care provider will help you determine the amount of weight gain that is right for you.  Avoid raw meat and uncooked cheese. These carry germs that can cause birth defects in the baby.  If you have low calcium intake from food, talk to your health care provider about whether you should take a  daily calcium supplement.  Limit foods that are high in fat and processed sugars, such as fried and sweet foods.  To prevent constipation: ? Drink enough fluid to keep your urine clear or pale yellow. ? Eat foods that are high in fiber, such as fresh fruits and vegetables, whole grains, and beans. Activity  Exercise only as directed by your health care provider. Most women can continue their usual exercise routine during pregnancy. Try to exercise for 30 minutes at least 5 days a week. Stop exercising if you experience uterine contractions.  Avoid heavy lifting, wear low heel shoes, and practice good posture.  A sexual relationship may be continued unless your health care provider directs you otherwise. Relieving pain and discomfort  Wear a good support bra to prevent discomfort from breast tenderness.  Take warm sitz baths to soothe any pain or discomfort caused by hemorrhoids. Use hemorrhoid cream if your health care provider approves.  Rest with your legs elevated if you have leg cramps or low back pain.  If you develop varicose veins, wear support hose. Elevate your feet for 15 minutes, 3-4 times a day. Limit salt in your diet. Prenatal Care  Write down your questions. Take them to your prenatal visits.  Keep all your prenatal visits as told by your health care provider. This is important. Safety  Wear your seat belt at all times when driving.  Make a list of emergency phone numbers, including numbers for family, friends, the hospital, and police and fire departments. General instructions  Ask your health care provider for a referral to a local prenatal education class. Begin classes no later than the beginning of month 6 of your pregnancy.  Ask for help if you have counseling or nutritional needs during pregnancy. Your health care provider can offer advice or refer you to specialists for help with various needs.  Do not use hot tubs, steam rooms, or saunas.  Do not  douche or use tampons or scented sanitary pads.  Do not cross your legs for long periods of time.  Avoid cat litter boxes and soil used by cats. These carry germs that can cause birth defects in the baby and possibly loss of the fetus by miscarriage or stillbirth.  Avoid all smoking, herbs, alcohol, and unprescribed drugs. Chemicals in these products can affect the formation and growth of the baby.  Do not use any products that contain nicotine or tobacco, such as cigarettes  and e-cigarettes. If you need help quitting, ask your health care provider.  Visit your dentist if you have not gone yet during your pregnancy. Use a soft toothbrush to brush your teeth and be gentle when you floss. Contact a health care provider if:  You have dizziness.  You have mild pelvic cramps, pelvic pressure, or nagging pain in the abdominal area.  You have persistent nausea, vomiting, or diarrhea.  You have a bad smelling vaginal discharge.  You have pain when you urinate. Get help right away if:  You have a fever.  You are leaking fluid from your vagina.  You have spotting or bleeding from your vagina.  You have severe abdominal cramping or pain.  You have rapid weight gain or weight loss.  You have shortness of breath with chest pain.  You notice sudden or extreme swelling of your face, hands, ankles, feet, or legs.  You have not felt your baby move in over an hour.  You have severe headaches that do not go away when you take medicine.  You have vision changes. Summary  The second trimester is from week 14 through week 27 (months 4 through 6). It is also a time when the fetus is growing rapidly.  Your body goes through many changes during pregnancy. The changes vary from woman to woman.  Avoid all smoking, herbs, alcohol, and unprescribed drugs. These chemicals affect the formation and growth your baby.  Do not use any tobacco products, such as cigarettes, chewing tobacco, and  e-cigarettes. If you need help quitting, ask your health care provider.  Contact your health care provider if you have any questions. Keep all prenatal visits as told by your health care provider. This is important. This information is not intended to replace advice given to you by your health care provider. Make sure you discuss any questions you have with your health care provider. Document Released: 03/24/2001 Document Revised: 09/05/2015 Document Reviewed: 05/31/2012 Elsevier Interactive Patient Education  2017 [place] FLU! Because you are pregnant, we at [facility], along with the Centers for Disease Control (CDC), recommend that you receive the flu vaccine to protect yourself and your baby from the flu. The flu is more likely to cause severe illness in pregnant women than in women of reproductive age who are not pregnant. Changes in the immune system, heart, and lungs during pregnancy make pregnant women (and women up to two weeks postpartum) more prone to severe illness from flu, including illness resulting in hospitalization. Flu also may be harmful for a pregnant womans developing baby. A common flu symptom is fever, which may be associated with neural tube defects and other adverse outcomes for a developing baby. Getting vaccinated can also help protect a baby after birth from flu. (Mom passes antibodies onto the developing baby during her pregnancy.)  A Flu Vaccine is the Best Protection Against Flu Getting a flu vaccine is the first and most important step in protecting against flu. Pregnant women should get a flu shot and not the live attenuated influenza vaccine (LAIV), also known as nasal spray flu vaccine. Flu vaccines given during pregnancy help protect both the mother and her baby from flu. Vaccination has been shown to reduce the risk of flu-associated acute respiratory infection in pregnant women by up to one-half. A 2018 study showed  that getting a flu shot reduced a pregnant womans risk of being hospitalized with flu by an average of 40  percent. Pregnant women who get a flu vaccine are also helping to protect their babies from flu illness for the first several months after their birth, when they are too young to get vaccinated.   A Long Record of Safety for Flu Shots in Pregnant Women Flu shots have been given to millions of pregnant women over many years with a good safety record. There is a lot of evidence that flu vaccines can be given safely during pregnancy; though these data are limited for the first trimester. The CDC recommends that pregnant women get vaccinated during any trimester of their pregnancy. It is very important for pregnant women to get the flu shot.   Other Preventive Actions In addition to getting a flu shot, pregnant women should take the same everyday preventive actions the CDC recommends of everyone, including covering coughs, washing hands often, and avoiding people who are sick.  Symptoms and Treatment If you get sick with flu symptoms call your doctor right away. There are antiviral drugs that can treat flu illness and prevent serious flu complications. The CDC recommends prompt treatment for people who have influenza infection or suspected influenza infection and who are at high risk of serious flu complications, such as people with asthma, diabetes (including gestational diabetes), or heart disease. Early treatment of influenza in hospitalized pregnant women has been shown to reduce the length of the hospital stay.  Symptoms Flu symptoms include fever, cough, sore throat, runny or stuffy nose, body aches, headache, chills and fatigue. Some people may also have vomiting and diarrhea. People may be infected with the flu and have respiratory symptoms without a fever.  Early Treatment is Important for Pregnant Women Treatment should begin as soon as possible because antiviral drugs work best when  started early (within 48 hours after symptoms start). Antiviral drugs can make your flu illness milder and make you feel better faster. They may also prevent serious health problems that can result from flu illness. Oral oseltamivir (Tamiflu) is the preferred treatment for pregnant women because it has the most studies available to suggest that it is safe and beneficial. Antiviral drugs require a prescription from your provider. Having a fever caused by flu infection or other infections early in pregnancy may be linked to birth defects in a baby. In addition to taking antiviral drugs, pregnant women who get a fever should treat their fever with Tylenol (acetaminophen) and contact their provider immediately.  When to Seek Emergency Medical Care If you are pregnant and have any of these signs, seek care immediately:  Difficulty breathing or shortness of breath  Pain or pressure in the chest or abdomen  Sudden dizziness  Confusion  Severe or persistent vomiting  High fever that is not responding to Tylenol (or store brand equivalent)  Decreased or no movement of your baby  [email or web address].[email or web address]

## 2019-03-13 NOTE — Progress Notes (Signed)
[place] 19+2 wks,breech,posterior placenta gr 0,normal ovaries,cx 3.6 cm,fhr 166 bpm,svp of fluid 3.3 cm,LVEICF  2.1 mm,EFW 281 g 41%,anatomy complete

## 2019-03-13 NOTE — Progress Notes (Signed)
   LOW-RISK PREGNANCY VISIT Patient name: Madison Decker MRN [id number]  Date of birth: 04/08/1991 Chief Complaint:   Routine Prenatal Visit  History of Present Illness:   [name] is a 28 y.o. G1P0 female at [redacted]w[redacted]d with an Estimated Date of Delivery: 08/05/19 being seen today for ongoing management of a low-risk pregnancy.  Today she reports no complaints. Contractions: Not present. Vag. Bleeding: None.  Movement: Present. denies leaking of fluid. Review of Systems:   Pertinent items are noted in HPI Denies abnormal vaginal discharge w/ itching/odor/irritation, headaches, visual changes, shortness of breath, chest pain, abdominal pain, severe nausea/vomiting, or problems with urination or bowel movements unless otherwise stated above. Pertinent History Reviewed:  Reviewed past medical,surgical, social, obstetrical and family history.  Reviewed problem list, medications and allergies. Physical Assessment:   Vitals:   03/13/19 0908  BP: 115/75  Pulse: 80  Weight: 182 lb (82.6 kg)  Body mass index is 29.38 kg/m.        Physical Examination:   General appearance: Well appearing, and in no distress  Mental status: Alert, oriented to person, place, and time  Skin: Warm & dry  Cardiovascular: Normal heart rate noted  Respiratory: Normal respiratory effort, no distress  Abdomen: Soft, gravid, nontender  Pelvic: Cervical exam deferred         Extremities: Edema: None  Fetal Status: Fetal Heart Rate (bpm): 166  u/s   Movement: Present    [place] 19+2 wks,breech,posterior placenta gr 0,normal ovaries,cx 3.6 cm,fhr 166 bpm,svp of fluid 3.3 cm,LVEICF  2.1 mm,EFW 281 g 41%,anatomy complete  Chaperone: n/a    No results found for this or any previous visit (from the past 24 hour(s)).  Assessment & Plan:  1) Low-risk pregnancy G1P0 at [redacted]w[redacted]d with an Estimated Date of Delivery: 08/05/19   2) Fetal isolated LVEICF, materit21 neg, discussed, printed info given   Meds: No orders of the  defined types were placed in this encounter.  Labs/procedures today: AFP (didn't do 1st IT), anatomy u/s  Plan:  Continue routine obstetrical care  Next visit: prefers online    Reviewed: Preterm labor symptoms and general obstetric precautions including but not limited to vaginal bleeding, contractions, leaking of fluid and fetal movement were reviewed in detail with the patient.  All questions were answered. Has home bp cuff. Check bp weekly, let us know if >140/90.   Follow-up: Return in about 4 weeks (around 04/10/2019) for [place], [place], CNM.  Orders Placed This Encounter  Procedures  . AFP TETRA   Roma Schanz CNM, [facility] 03/13/2019 9:26 AM

## 2019-03-15 LAB — AFP TETRA
DIA Mom Value: 1.45
DIA Value (EIA): 227.28 pg/mL
DSR (By Age)    1 IN: 936
DSR (Second Trimester) 1 IN: 9519
Gestational Age: 19.2 WEEKS
MSAFP Mom: 1.89
MSAFP: 94 ng/mL
MSHCG Mom: 0.83
MSHCG: 18439 m[IU]/mL
Maternal Age At EDD: 26.8 yr
Osb Risk: 2068
T18 (By Age): 1:3648 {titer}
Test Results:: NEGATIVE
Weight: 182 [lb_av]
uE3 Mom: 1.19
uE3 Value: 2.16 ng/mL

## 2019-03-24 ENCOUNTER — Other Ambulatory Visit: Payer: Self-pay

## 2019-03-24 DIAGNOSIS — Z20822 Contact with and (suspected) exposure to covid-19: Secondary | ICD-10-CM

## 2019-03-25 LAB — NOVEL CORONAVIRUS, NAA: SARS-CoV-2, NAA: NOT DETECTED

## 2019-04-10 ENCOUNTER — Other Ambulatory Visit: Payer: Self-pay

## 2019-04-10 ENCOUNTER — Encounter: Payer: Self-pay | Admitting: Advanced Practice Midwife

## 2019-04-10 ENCOUNTER — Telehealth (INDEPENDENT_AMBULATORY_CARE_PROVIDER_SITE_OTHER): Payer: Medicaid Other | Admitting: Advanced Practice Midwife

## 2019-04-10 VITALS — BP 125/64 | HR 90 | Ht 66.0 in

## 2019-04-10 DIAGNOSIS — Z3A23 23 weeks gestation of pregnancy: Secondary | ICD-10-CM | POA: Diagnosis not present

## 2019-04-10 DIAGNOSIS — Z3402 Encounter for supervision of normal first pregnancy, second trimester: Secondary | ICD-10-CM | POA: Diagnosis not present

## 2019-04-10 NOTE — Progress Notes (Signed)
   TELEHEALTH VIRTUAL OBSTETRICS VISIT ENCOUNTER NOTE  I connected with Madison Decker on 04/10/19 at  1:50 PM EST by telephone at home and verified that I am speaking with the correct person using two identifiers.   I discussed the limitations, risks, security and privacy concerns of performing an evaluation and management service by telephone and the availability of in person appointments. I also discussed with the patient that there may be a patient responsible charge related to this service. The patient expressed understanding and agreed to proceed.  Subjective:  Madison Decker is a 27 y.o. G1P0 at [redacted]w[redacted]d being followed for ongoing prenatal care.  She is currently monitored for the following issues for this low-risk pregnancy and has Menorrhagia with irregular cycle and Supervision of normal first pregnancy on their problem list.  Patient reports no complaints. Reports fetal movement. Denies any contractions, bleeding or leaking of fluid.   The following portions of the patient's history were reviewed and updated as appropriate: allergies, current medications, past family history, past medical history, past social history, past surgical history and problem list.   Objective:   General:  Alert, oriented and cooperative.   Mental Status: Normal mood and affect perceived. Normal judgment and thought content.  Rest of physical exam deferred due to type of encounter  Assessment and Plan:  Pregnancy: G1P0 at [redacted]w[redacted]d 1. Encounter for supervision of normal first pregnancy in second trimester   Preterm labor symptoms and general obstetric precautions including but not limited to vaginal bleeding, contractions, leaking of fluid and fetal movement were reviewed in detail with the patient.  I discussed the assessment and treatment plan with the patient. The patient was provided an opportunity to ask questions and all were answered. The patient agreed with the plan and demonstrated an understanding  of the instructions. The patient was advised to call back or seek an in-person office evaluation/go to [facility] at [facility] for any urgent or concerning symptoms. Please refer to After Visit Summary for other counseling recommendations.   I provided 10 minutes of non-face-to-face time during this encounter.  Return in about 4 weeks (around 05/08/2019) for PN2/LROB.  No future appointments.  Christin Fudge, [place] for [employer], [place]

## 2019-04-10 NOTE — Patient Instructions (Signed)

## 2019-04-12 ENCOUNTER — Ambulatory Visit: Payer: Medicaid Other | Attending: Internal Medicine

## 2019-04-12 ENCOUNTER — Other Ambulatory Visit: Payer: Self-pay

## 2019-04-12 DIAGNOSIS — Z20822 Contact with and (suspected) exposure to covid-19: Secondary | ICD-10-CM

## 2019-04-13 LAB — NOVEL CORONAVIRUS, NAA: SARS-CoV-2, NAA: NOT DETECTED

## 2019-04-14 NOTE — L&D Delivery Note (Signed)
Delivery Note At 1:36 PM a non-viable female was delivered via Vaginal, Spontaneous (Presentation: Left Occiput Anterior).  APGAR: 8, 9; weight pending  .   Placenta status: Spontaneous, Intact.  Cord: 3 vessels with the following complications: None.  Cord pH: NA  Anesthesia: Epidural Episiotomy: None Lacerations:  Periclitoral; repaired by Dr. Adrian Blackwater Suture Repair: 4.0 monocryl Est. Blood Loss (mL):  127  Mom to postpartum.  Baby to Couplet care / Skin to Skin.  Charlesetta Garibaldi  07/17/2019, 2:08 PM

## 2019-05-08 ENCOUNTER — Ambulatory Visit (INDEPENDENT_AMBULATORY_CARE_PROVIDER_SITE_OTHER): Payer: Medicaid Other | Admitting: Advanced Practice Midwife

## 2019-05-08 ENCOUNTER — Other Ambulatory Visit: Payer: Medicaid Other

## 2019-05-08 ENCOUNTER — Other Ambulatory Visit: Payer: Self-pay

## 2019-05-08 VITALS — BP 99/60 | HR 86

## 2019-05-08 DIAGNOSIS — Z3A27 27 weeks gestation of pregnancy: Secondary | ICD-10-CM

## 2019-05-08 DIAGNOSIS — Z3402 Encounter for supervision of normal first pregnancy, second trimester: Secondary | ICD-10-CM

## 2019-05-08 DIAGNOSIS — Z1389 Encounter for screening for other disorder: Secondary | ICD-10-CM

## 2019-05-08 DIAGNOSIS — Z331 Pregnant state, incidental: Secondary | ICD-10-CM

## 2019-05-08 DIAGNOSIS — Z23 Encounter for immunization: Secondary | ICD-10-CM

## 2019-05-08 LAB — POCT URINALYSIS DIPSTICK OB
Blood, UA: NEGATIVE
Leukocytes, UA: NEGATIVE
Nitrite, UA: NEGATIVE
POC,PROTEIN,UA: NEGATIVE

## 2019-05-08 NOTE — Patient Instructions (Signed)
Chastity Culp, I greatly value your feedback.  If you receive a survey following your visit with us today, we appreciate you taking the time to fill it out.  Thanks, Kim , CNM  [facility]!!! It is now [facility] ([address] [place], [place] [address]) Entrance located off of [address] Free 24/7 valet parking    Go to [email or web address] to register for FREE online childbirth classes   Call the office ([phone]) or go to [facility] if:  You begin to have strong, frequent contractions  Your water breaks.  Sometimes it is a big gush of fluid, sometimes it is just a trickle that keeps getting your panties wet or running down your legs  You have vaginal bleeding.  It is normal to have a small amount of spotting if your cervix was checked.   You don't feel your baby moving like normal.  If you don't, get you something to eat and drink and lay down and focus on feeling your baby move.  You should feel at least 10 movements in 2 hours.  If you don't, you should call the office or go to [facility].    Tdap Vaccine  It is recommended that you get the Tdap vaccine during the third trimester of EACH pregnancy to help protect your baby from getting pertussis (whooping cough)  27-36 weeks is the BEST time to do this so that you can pass the protection on to your baby. During pregnancy is better than after pregnancy, but if you are unable to get it during pregnancy it will be offered at the hospital.   You can get this vaccine with us, at the health department, your family doctor, or some local pharmacies  Everyone who will be around your baby should also be up-to-date on their vaccines before the baby comes. Adults (who are not pregnant) only need 1 dose of Tdap during adulthood.   [place] Pediatricians/Family Doctors:  [facility] [phone]            [facility] [phone]                  [facility] [phone] (usually not accepting new patients unless you have family there already, you are always welcome to call and ask)       [facility] [phone]       [place] Pediatricians/Family Doctors:   [facility]: [phone]  [facility]: [phone]  [facility]: [phone]  [place] Family Doctors:   [facility]: [phone]   [facility]: [phone]  [place] Family Doctors:  [facility]: [phone]   Home Blood Pressure Monitoring for Patients   Your provider has recommended that you check your blood pressure (BP) at least once a week at home. If you do not have a blood pressure cuff at home, one will be provided for you. Contact your provider if you have not received your monitor within 1 week.   Helpful Tips for Accurate Home Blood Pressure Checks  . Don't smoke, exercise, or drink caffeine 30 minutes before checking your BP . Use the restroom before checking your BP (a full bladder can raise your pressure) . Relax in a comfortable upright chair . Feet on the ground . Left arm resting comfortably on a flat surface at the level of your heart . Legs uncrossed . Back supported . Sit quietly and don't talk .   Place the cuff on your bare arm . Adjust snuggly, so that only two fingertips can fit between your skin and the top of the cuff . Check 2 readings separated by at least one minute . Keep a log of your BP readings . For a visual, please reference this diagram: http://ccnc.care/bpdiagram  Provider Name: [facility]     Phone: [phone]  Zone 1: ALL CLEAR  Continue to monitor your symptoms:  . BP reading is less than 140 (top number) or less than 90 (bottom number)  . No right upper stomach pain . No headaches or seeing spots . No feeling nauseated or throwing up . No swelling in face and  hands  Zone 2: CAUTION Call your doctor's office for any of the following:  . BP reading is greater than 140 (top number) or greater than 90 (bottom number)  . Stomach pain under your ribs in the middle or right side . Headaches or seeing spots . Feeling nauseated or throwing up . Swelling in face and hands  Zone 3: EMERGENCY  Seek immediate medical care if you have any of the following:  . BP reading is greater than160 (top number) or greater than 110 (bottom number) . Severe headaches not improving with Tylenol . Serious difficulty catching your breath . Any worsening symptoms from Zone 2   Third Trimester of Pregnancy The third trimester is from week 29 through week 42, months 7 through 9. The third trimester is a time when the fetus is growing rapidly. At the end of the ninth month, the fetus is about 20 inches in length and weighs 6-10 pounds.  BODY CHANGES Your body goes through many changes during pregnancy. The changes vary from woman to woman.   Your weight will continue to increase. You can expect to gain 25-35 pounds (11-16 kg) by the end of the pregnancy.  You may begin to get stretch marks on your hips, abdomen, and breasts.  You may urinate more often because the fetus is moving lower into your pelvis and pressing on your bladder.  You may develop or continue to have heartburn as a result of your pregnancy.  You may develop constipation because certain hormones are causing the muscles that push waste through your intestines to slow down.  You may develop hemorrhoids or swollen, bulging veins (varicose veins).  You may have pelvic pain because of the weight gain and pregnancy hormones relaxing your joints between the bones in your pelvis. Backaches may result from overexertion of the muscles supporting your posture.  You may have changes in your hair. These can include thickening of your hair, rapid growth, and changes in texture. Some women also have hair loss during  or after pregnancy, or hair that feels dry or thin. Your hair will most likely return to normal after your baby is born.  Your breasts will continue to grow and be tender. A yellow discharge may leak from your breasts called colostrum.  Your belly button may stick out.  You may feel short of breath because of your expanding uterus.  You may notice the fetus "dropping," or moving lower in your abdomen.  You may have a bloody mucus discharge. This usually occurs a few days to a week before labor begins.  Your cervix becomes thin and soft (effaced) near your due date. WHAT TO EXPECT AT YOUR PRENATAL EXAMS  You will have prenatal exams every 2 weeks until week 36. Then, you will have weekly prenatal exams. During  a routine prenatal visit:  You will be weighed to make sure you and the fetus are growing normally.  Your blood pressure is taken.  Your abdomen will be measured to track your baby's growth.  The fetal heartbeat will be listened to.  Any test results from the previous visit will be discussed.  You may have a cervical check near your due date to see if you have effaced. At around 36 weeks, your caregiver will check your cervix. At the same time, your caregiver will also perform a test on the secretions of the vaginal tissue. This test is to determine if a type of bacteria, Group B streptococcus, is present. Your caregiver will explain this further. Your caregiver may ask you:  What your birth plan is.  How you are feeling.  If you are feeling the baby move.  If you have had any abnormal symptoms, such as leaking fluid, bleeding, severe headaches, or abdominal cramping.  If you have any questions. Other tests or screenings that may be performed during your third trimester include:  Blood tests that check for low iron levels (anemia).  Fetal testing to check the health, activity level, and growth of the fetus. Testing is done if you have certain medical conditions or if  there are problems during the pregnancy. FALSE LABOR You may feel small, irregular contractions that eventually go away. These are called Braxton Hicks contractions, or false labor. Contractions may last for hours, days, or even weeks before true labor sets in. If contractions come at regular intervals, intensify, or become painful, it is best to be seen by your caregiver.  SIGNS OF LABOR   Menstrual-like cramps.  Contractions that are 5 minutes apart or less.  Contractions that start on the top of the uterus and spread down to the lower abdomen and back.  A sense of increased pelvic pressure or back pain.  A watery or bloody mucus discharge that comes from the vagina. If you have any of these signs before the 37th week of pregnancy, call your caregiver right away. You need to go to the hospital to get checked immediately. HOME CARE INSTRUCTIONS   Avoid all smoking, herbs, alcohol, and unprescribed drugs. These chemicals affect the formation and growth of the baby.  Follow your caregiver's instructions regarding medicine use. There are medicines that are either safe or unsafe to take during pregnancy.  Exercise only as directed by your caregiver. Experiencing uterine cramps is a good sign to stop exercising.  Continue to eat regular, healthy meals.  Wear a good support bra for breast tenderness.  Do not use hot tubs, steam rooms, or saunas.  Wear your seat belt at all times when driving.  Avoid raw meat, uncooked cheese, cat litter boxes, and soil used by cats. These carry germs that can cause birth defects in the baby.  Take your prenatal vitamins.  Try taking a stool softener (if your caregiver approves) if you develop constipation. Eat more high-fiber foods, such as fresh vegetables or fruit and whole grains. Drink plenty of fluids to keep your urine clear or pale yellow.  Take warm sitz baths to soothe any pain or discomfort caused by hemorrhoids. Use hemorrhoid cream if your  caregiver approves.  If you develop varicose veins, wear support hose. Elevate your feet for 15 minutes, 3-4 times a day. Limit salt in your diet.  Avoid heavy lifting, wear low heal shoes, and practice good posture.  Rest a lot with your legs elevated if you  have leg cramps or low back pain.  Visit your dentist if you have not gone during your pregnancy. Use a soft toothbrush to brush your teeth and be gentle when you floss.  A sexual relationship may be continued unless your caregiver directs you otherwise.  Do not travel far distances unless it is absolutely necessary and only with the approval of your caregiver.  Take prenatal classes to understand, practice, and ask questions about the labor and delivery.  Make a trial run to the hospital.  Pack your hospital bag.  Prepare the baby's nursery.  Continue to go to all your prenatal visits as directed by your caregiver. SEEK MEDICAL CARE IF:  You are unsure if you are in labor or if your water has broken.  You have dizziness.  You have mild pelvic cramps, pelvic pressure, or nagging pain in your abdominal area.  You have persistent nausea, vomiting, or diarrhea.  You have a bad smelling vaginal discharge.  You have pain with urination. SEEK IMMEDIATE MEDICAL CARE IF:   You have a fever.  You are leaking fluid from your vagina.  You have spotting or bleeding from your vagina.  You have severe abdominal cramping or pain.  You have rapid weight loss or gain.  You have shortness of breath with chest pain.  You notice sudden or extreme swelling of your face, hands, ankles, feet, or legs.  You have not felt your baby move in over an hour.  You have severe headaches that do not go away with medicine.  You have vision changes. Document Released: 03/24/2001 Document Revised: 04/04/2013 Document Reviewed: 05/31/2012 [facility] Patient Information 2015 [place], [place]. This information is not intended to replace advice  given to you by your health care provider. Make sure you discuss any questions you have with your health care provider.  PROTECT YOURSELF & YOUR BABY FROM THE FLU! Because you are pregnant, we at [facility], along with the Centers for Disease Control (CDC), recommend that you receive the flu vaccine to protect yourself and your baby from the flu. The flu is more likely to cause severe illness in pregnant women than in women of reproductive age who are not pregnant. Changes in the immune system, heart, and lungs during pregnancy make pregnant women (and women up to two weeks postpartum) more prone to severe illness from flu, including illness resulting in hospitalization. Flu also may be harmful for a pregnant woman's developing baby. A common flu symptom is fever, which may be associated with neural tube defects and other adverse outcomes for a developing baby. Getting vaccinated can also help protect a baby after birth from flu. (Mom passes antibodies onto the developing baby during her pregnancy.)  A Flu Vaccine is the Best Protection Against Flu Getting a flu vaccine is the first and most important step in protecting against flu. Pregnant women should get a flu shot and not the live attenuated influenza vaccine (LAIV), also known as nasal spray flu vaccine. Flu vaccines given during pregnancy help protect both the mother and her baby from flu. Vaccination has been shown to reduce the risk of flu-associated acute respiratory infection in pregnant women by up to one-half. A 2018 study showed that getting a flu shot reduced a pregnant woman's risk of being hospitalized with flu by an average of 40 percent. Pregnant women who get a flu vaccine are also helping to protect their babies from flu illness for the first several months after their birth, when they are too  young to get vaccinated.   A Long Record of Safety for Flu Shots in Pregnant Women Flu shots have been given to millions of pregnant women over  many years with a good safety record. There is a lot of evidence that flu vaccines can be given safely during pregnancy; though these data are limited for the first trimester. The CDC recommends that pregnant women get vaccinated during any trimester of their pregnancy. It is very important for pregnant women to get the flu shot.   Other Preventive Actions In addition to getting a flu shot, pregnant women should take the same everyday preventive actions the CDC recommends of everyone, including covering coughs, washing hands often, and avoiding people who are sick.  Symptoms and Treatment If you get sick with flu symptoms call your doctor right away. There are antiviral drugs that can treat flu illness and prevent serious flu complications. The CDC recommends prompt treatment for people who have influenza infection or suspected influenza infection and who are at high risk of serious flu complications, such as people with asthma, diabetes (including gestational diabetes), or heart disease. Early treatment of influenza in hospitalized pregnant women has been shown to reduce the length of the hospital stay.  Symptoms Flu symptoms include fever, cough, sore throat, runny or stuffy nose, body aches, headache, chills and fatigue. Some people may also have vomiting and diarrhea. People may be infected with the flu and have respiratory symptoms without a fever.  Early Treatment is Important for Pregnant Women Treatment should begin as soon as possible because antiviral drugs work best when started early (within 48 hours after symptoms start). Antiviral drugs can make your flu illness milder and make you feel better faster. They may also prevent serious health problems that can result from flu illness. Oral oseltamivir (Tamiflu) is the preferred treatment for pregnant women because it has the most studies available to suggest that it is safe and beneficial. Antiviral drugs require a prescription from your  provider. Having a fever caused by flu infection or other infections early in pregnancy may be linked to birth defects in a baby. In addition to taking antiviral drugs, pregnant women who get a fever should treat their fever with Tylenol (acetaminophen) and contact their provider immediately.  When to [place] If you are pregnant and have any of these signs, seek care immediately:  Difficulty breathing or shortness of breath  Pain or pressure in the chest or abdomen  Sudden dizziness  Confusion  Severe or persistent vomiting  High fever that is not responding to Tylenol (or store brand equivalent)  Decreased or no movement of your baby  [email or web address]

## 2019-05-08 NOTE — Progress Notes (Addendum)
   LOW-RISK PREGNANCY VISIT Patient name: Madison Decker MRN [id number]  Date of birth: [birth year] Chief Complaint:   Routine Prenatal Visit (PN2)  History of Present Illness:   Madison Decker is a 29 y.o. G1P0 female at [redacted]w[redacted]d with an Estimated Date of Delivery: 08/05/19 being seen today for ongoing management of a low-risk pregnancy.  Today she reports no complaints. Contractions: Not present. Vag. Bleeding: None.  Movement: Present. denies leaking of fluid. Review of Systems:   Pertinent items are noted in HPI Denies abnormal vaginal discharge w/ itching/odor/irritation, headaches, visual changes, shortness of breath, chest pain, abdominal pain, severe nausea/vomiting, or problems with urination or bowel movements unless otherwise stated above. Pertinent History Reviewed:  Reviewed past medical,surgical, social, obstetrical and family history.  Reviewed problem list, medications and allergies. Physical Assessment:   Vitals:   05/08/19 0914  BP: 99/60  Pulse: 86  There is no height or weight on file to calculate BMI.        Physical Examination:   General appearance: Well appearing, and in no distress  Mental status: Alert, oriented to person, place, and time  Skin: Warm & dry  Cardiovascular: Normal heart rate noted  Respiratory: Normal respiratory effort, no distress  Abdomen: Soft, gravid, nontender  Pelvic: Cervical exam deferred         Extremities: Edema: Trace  Fetal Status: Fetal Heart Rate (bpm): 147 Fundal Height: 26 cm Movement: Present    Results for orders placed or performed in visit on 05/08/19 (from the past 24 hour(s))  POC Urinalysis Dipstick OB   Collection Time: 05/08/19  9:40 AM  Result Value Ref Range   Color, UA     Clarity, UA     Glucose, UA Small (1+) (A) Negative   Bilirubin, UA     Ketones, UA trace    Spec Grav, UA     Blood, UA neg    pH, UA     POC,PROTEIN,UA Negative Negative, Trace, Small (1+), Moderate (2+), Large (3+), 4+   Urobilinogen, UA     Nitrite, UA neg    Leukocytes, UA Negative Negative   Appearance     Odor      Assessment & Plan:  1) Low-risk pregnancy G1P0 at [redacted]w[redacted]d with an Estimated Date of Delivery: 08/05/19   (glucosuria due to drinking glucola)   Meds: No orders of the defined types were placed in this encounter.  Labs/procedures today: PN2, Tdap  Plan:  Continue routine obstetrical care   Reviewed: Preterm labor symptoms and general obstetric precautions including but not limited to vaginal bleeding, contractions, leaking of fluid and fetal movement were reviewed in detail with the patient.  All questions were answered. Has home bp cuff. Check bp weekly, let us know if >140/90.   Follow-up: Return in about 3 weeks (around 05/29/2019) for LROB, in person.  Orders Placed This Encounter  Procedures  . Tdap vaccine greater than or equal to 7yo IM  . POC Urinalysis Dipstick OB   Arabella Merles [facility] 05/08/2019 9:43 AM

## 2019-05-09 LAB — CBC
Hematocrit: 36.6 % (ref 34.0–46.6)
Hemoglobin: 12.3 g/dL (ref 11.1–15.9)
MCH: 30 pg (ref 26.6–33.0)
MCHC: 33.6 g/dL (ref 31.5–35.7)
MCV: 89 fL (ref 79–97)
Platelets: 285 10*3/uL (ref 150–450)
RBC: 4.1 x10E6/uL (ref 3.77–5.28)
RDW: 13.6 % (ref 11.7–15.4)
WBC: 6.5 10*3/uL (ref 3.4–10.8)

## 2019-05-09 LAB — ANTIBODY SCREEN: Antibody Screen: NEGATIVE

## 2019-05-09 LAB — GLUCOSE TOLERANCE, 2 HOURS W/ 1HR
Glucose, 1 hour: 139 mg/dL (ref 65–179)
Glucose, 2 hour: 100 mg/dL (ref 65–152)
Glucose, Fasting: 75 mg/dL (ref 65–91)

## 2019-05-09 LAB — RPR: RPR Ser Ql: NONREACTIVE

## 2019-05-09 LAB — HIV ANTIBODY (ROUTINE TESTING W REFLEX): HIV Screen 4th Generation wRfx: NONREACTIVE

## 2019-05-29 ENCOUNTER — Encounter: Payer: Self-pay | Admitting: Advanced Practice Midwife

## 2019-05-29 ENCOUNTER — Other Ambulatory Visit: Payer: Self-pay

## 2019-05-29 ENCOUNTER — Ambulatory Visit (INDEPENDENT_AMBULATORY_CARE_PROVIDER_SITE_OTHER): Payer: Medicaid Other | Admitting: Advanced Practice Midwife

## 2019-05-29 VITALS — BP 115/65 | HR 79 | Wt 186.0 lb

## 2019-05-29 DIAGNOSIS — Z3A30 30 weeks gestation of pregnancy: Secondary | ICD-10-CM

## 2019-05-29 DIAGNOSIS — Z1389 Encounter for screening for other disorder: Secondary | ICD-10-CM

## 2019-05-29 DIAGNOSIS — Z331 Pregnant state, incidental: Secondary | ICD-10-CM

## 2019-05-29 LAB — POCT URINALYSIS DIPSTICK OB
Glucose, UA: NEGATIVE
Leukocytes, UA: NEGATIVE
Nitrite, UA: NEGATIVE

## 2019-05-29 NOTE — Patient Instructions (Signed)
Madison Decker, I greatly value your feedback.  If you receive a survey following your visit with [place] today, we appreciate you taking the time to fill it out.  Thanks, Philipp Deputy, CNM  [facility] HOSPITAL HAS MOVED!!! It is now [facility] at [facility] ([address] [place], [place] [address]) Entrance located off of [address] Free 24/7 valet parking    Go to [email or web address] to register for FREE online childbirth classes   Call the office [phone]) or go to [facility] if:  You begin to have strong, frequent contractions  Your water breaks.  Sometimes it is a big gush of fluid, sometimes it is just a trickle that keeps getting your panties wet or running down your legs  You have vaginal bleeding.  It is normal to have a small amount of spotting if your cervix was checked.   You don't feel your baby moving like normal.  If you don't, get you something to eat and drink and lay down and focus on feeling your baby move.  You should feel at least 10 movements in 2 hours.  If you don't, you should call the office or go to [facility].    Tdap Vaccine  It is recommended that you get the Tdap vaccine during the third trimester of EACH pregnancy to help protect your baby from getting pertussis (whooping cough)  27-36 weeks is the BEST time to do this so that you can pass the protection on to your baby. During pregnancy is better than after pregnancy, but if you are unable to get it during pregnancy it will be offered at the hospital.   You can get this vaccine with [place], at the health department, your family doctor, or some local pharmacies  Everyone who will be around your baby should also be up-to-date on their vaccines before the baby comes. Adults (who are not pregnant) only need 1 dose of Tdap during adulthood.   [place] Pediatricians/Family Doctors:  [facility] [phone]            [facility] [phone]                  [facility] [phone] (usually not accepting new patients unless you have family there already, you are always welcome to call and ask)       [facility] Department [phone]       [facility] Pediatricians/Family Doctors:   [facility]: [phone]  [facility]: [phone]  [facility]: [phone]  [facility] Doctors:   [facility]: [phone]   [facility]: [phone]  [facility] Doctors:  [facility]: [phone]   Home Blood Pressure Monitoring for Patients   Your provider has recommended that you check your blood pressure (BP) at least once a week at home. If you do not have a blood pressure cuff at home, one will be provided for you. Contact your provider if you have not received your monitor within 1 week.   Helpful Tips for Accurate Home Blood Pressure Checks  . Don't smoke, exercise, or drink caffeine 30 minutes before checking your BP . Use the restroom before checking your BP (a full bladder can raise your pressure) . Relax in a comfortable upright chair . Feet on the ground . Left arm resting comfortably on a flat surface at the level of your heart . Legs uncrossed . Back supported . Sit quietly and don't talk .  Place the cuff on your bare arm . Adjust snuggly, so that only two fingertips can fit between your skin and the top of the cuff . Check 2 readings separated by at least one minute . Keep a log of your BP readings . For a visual, please reference this diagram: http://ccnc.care/bpdiagram  Provider Name: [facility]     Phone: [phone]  Zone 1: ALL CLEAR  Continue to monitor your symptoms:  . BP reading is less than 140 (top number) or less than 90 (bottom number)  . No right upper stomach pain . No headaches or seeing spots . No feeling nauseated or throwing up . No swelling in face and  hands  Zone 2: CAUTION Call your doctor's office for any of the following:  . BP reading is greater than 140 (top number) or greater than 90 (bottom number)  . Stomach pain under your ribs in the middle or right side . Headaches or seeing spots . Feeling nauseated or throwing up . Swelling in face and hands  Zone 3: EMERGENCY  Seek immediate medical care if you have any of the following:  . BP reading is greater than160 (top number) or greater than 110 (bottom number) . Severe headaches not improving with Tylenol . Serious difficulty catching your breath . Any worsening symptoms from Zone 2   Third Trimester of Pregnancy The third trimester is from week 29 through week 42, months 7 through 9. The third trimester is a time when the fetus is growing rapidly. At the end of the ninth month, the fetus is about 20 inches in length and weighs 6-10 pounds.  BODY CHANGES Your body goes through many changes during pregnancy. The changes vary from woman to woman.   Your weight will continue to increase. You can expect to gain 25-35 pounds (11-16 kg) by the end of the pregnancy.  You may begin to get stretch marks on your hips, abdomen, and breasts.  You may urinate more often because the fetus is moving lower into your pelvis and pressing on your bladder.  You may develop or continue to have heartburn as a result of your pregnancy.  You may develop constipation because certain hormones are causing the muscles that push waste through your intestines to slow down.  You may develop hemorrhoids or swollen, bulging veins (varicose veins).  You may have pelvic pain because of the weight gain and pregnancy hormones relaxing your joints between the bones in your pelvis. Backaches may result from overexertion of the muscles supporting your posture.  You may have changes in your hair. These can include thickening of your hair, rapid growth, and changes in texture. Some women also have hair loss during  or after pregnancy, or hair that feels dry or thin. Your hair will most likely return to normal after your baby is born.  Your breasts will continue to grow and be tender. A yellow discharge may leak from your breasts called colostrum.  Your belly button may stick out.  You may feel short of breath because of your expanding uterus.  You may notice the fetus "dropping," or moving lower in your abdomen.  You may have a bloody mucus discharge. This usually occurs a few days to a week before labor begins.  Your cervix becomes thin and soft (effaced) near your due date. WHAT TO EXPECT AT YOUR PRENATAL EXAMS  You will have prenatal exams every 2 weeks until week 36. Then, you will have weekly prenatal exams. During  a routine prenatal visit:  You will be weighed to make sure you and the fetus are growing normally.  Your blood pressure is taken.  Your abdomen will be measured to track your baby's growth.  The fetal heartbeat will be listened to.  Any test results from the previous visit will be discussed.  You may have a cervical check near your due date to see if you have effaced. At around 36 weeks, your caregiver will check your cervix. At the same time, your caregiver will also perform a test on the secretions of the vaginal tissue. This test is to determine if a type of bacteria, Group B streptococcus, is present. Your caregiver will explain this further. Your caregiver may ask you:  What your birth plan is.  How you are feeling.  If you are feeling the baby move.  If you have had any abnormal symptoms, such as leaking fluid, bleeding, severe headaches, or abdominal cramping.  If you have any questions. Other tests or screenings that may be performed during your third trimester include:  Blood tests that check for low iron levels (anemia).  Fetal testing to check the health, activity level, and growth of the fetus. Testing is done if you have certain medical conditions or if  there are problems during the pregnancy. FALSE LABOR You may feel small, irregular contractions that eventually go away. These are called Braxton Hicks contractions, or false labor. Contractions may last for hours, days, or even weeks before true labor sets in. If contractions come at regular intervals, intensify, or become painful, it is best to be seen by your caregiver.  SIGNS OF LABOR   Menstrual-like cramps.  Contractions that are 5 minutes apart or less.  Contractions that start on the top of the uterus and spread down to the lower abdomen and back.  A sense of increased pelvic pressure or back pain.  A watery or bloody mucus discharge that comes from the vagina. If you have any of these signs before the 37th week of pregnancy, call your caregiver right away. You need to go to the hospital to get checked immediately. HOME CARE INSTRUCTIONS   Avoid all smoking, herbs, alcohol, and unprescribed drugs. These chemicals affect the formation and growth of the baby.  Follow your caregiver's instructions regarding medicine use. There are medicines that are either safe or unsafe to take during pregnancy.  Exercise only as directed by your caregiver. Experiencing uterine cramps is a good sign to stop exercising.  Continue to eat regular, healthy meals.  Wear a good support bra for breast tenderness.  Do not use hot tubs, steam rooms, or saunas.  Wear your seat belt at all times when driving.  Avoid raw meat, uncooked cheese, cat litter boxes, and soil used by cats. These carry germs that can cause birth defects in the baby.  Take your prenatal vitamins.  Try taking a stool softener (if your caregiver approves) if you develop constipation. Eat more high-fiber foods, such as fresh vegetables or fruit and whole grains. Drink plenty of fluids to keep your urine clear or pale yellow.  Take warm sitz baths to soothe any pain or discomfort caused by hemorrhoids. Use hemorrhoid cream if your  caregiver approves.  If you develop varicose veins, wear support hose. Elevate your feet for 15 minutes, 3-4 times a day. Limit salt in your diet.  Avoid heavy lifting, wear low heal shoes, and practice good posture.  Rest a lot with your legs elevated if you  have leg cramps or low back pain.  Visit your dentist if you have not gone during your pregnancy. Use a soft toothbrush to brush your teeth and be gentle when you floss.  A sexual relationship may be continued unless your caregiver directs you otherwise.  Do not travel far distances unless it is absolutely necessary and only with the approval of your caregiver.  Take prenatal classes to understand, practice, and ask questions about the labor and delivery.  Make a trial run to the hospital.  Pack your hospital bag.  Prepare the baby's nursery.  Continue to go to all your prenatal visits as directed by your caregiver. SEEK MEDICAL CARE IF:  You are unsure if you are in labor or if your water has broken.  You have dizziness.  You have mild pelvic cramps, pelvic pressure, or nagging pain in your abdominal area.  You have persistent nausea, vomiting, or diarrhea.  You have a bad smelling vaginal discharge.  You have pain with urination. SEEK IMMEDIATE MEDICAL CARE IF:   You have a fever.  You are leaking fluid from your vagina.  You have spotting or bleeding from your vagina.  You have severe abdominal cramping or pain.  You have rapid weight loss or gain.  You have shortness of breath with chest pain.  You notice sudden or extreme swelling of your face, hands, ankles, feet, or legs.  You have not felt your baby move in over an hour.  You have severe headaches that do not go away with medicine.  You have vision changes. Document Released: 03/24/2001 Document Revised: 04/04/2013 Document Reviewed: 05/31/2012 [facility] Patient Information 2015 [place], [place]. This information is not intended to replace advice  given to you by your health care provider. Make sure you discuss any questions you have with your health care provider.  PROTECT YOURSELF & YOUR BABY FROM THE FLU! Because you are pregnant, we at [facility], along with the Centers for Disease Control (CDC), recommend that you receive the flu vaccine to protect yourself and your baby from the flu. The flu is more likely to cause severe illness in pregnant women than in women of reproductive age who are not pregnant. Changes in the immune system, heart, and lungs during pregnancy make pregnant women (and women up to two weeks postpartum) more prone to severe illness from flu, including illness resulting in hospitalization. Flu also may be harmful for a pregnant woman's developing baby. A common flu symptom is fever, which may be associated with neural tube defects and other adverse outcomes for a developing baby. Getting vaccinated can also help protect a baby after birth from flu. (Mom passes antibodies onto the developing baby during her pregnancy.)  A Flu Vaccine is the Best Protection Against Flu Getting a flu vaccine is the first and most important step in protecting against flu. Pregnant women should get a flu shot and not the live attenuated influenza vaccine (LAIV), also known as nasal spray flu vaccine. Flu vaccines given during pregnancy help protect both the mother and her baby from flu. Vaccination has been shown to reduce the risk of flu-associated acute respiratory infection in pregnant women by up to one-half. A 2018 study showed that getting a flu shot reduced a pregnant woman's risk of being hospitalized with flu by an average of 40 percent. Pregnant women who get a flu vaccine are also helping to protect their babies from flu illness for the first several months after their birth, when they are too  young to get vaccinated.   A Long Record of Safety for Flu Shots in Pregnant Women Flu shots have been given to millions of pregnant women over  many years with a good safety record. There is a lot of evidence that flu vaccines can be given safely during pregnancy; though these data are limited for the first trimester. The CDC recommends that pregnant women get vaccinated during any trimester of their pregnancy. It is very important for pregnant women to get the flu shot.   Other Preventive Actions In addition to getting a flu shot, pregnant women should take the same everyday preventive actions the CDC recommends of everyone, including covering coughs, washing hands often, and avoiding people who are sick.  Symptoms and Treatment If you get sick with flu symptoms call your doctor right away. There are antiviral drugs that can treat flu illness and prevent serious flu complications. The CDC recommends prompt treatment for people who have influenza infection or suspected influenza infection and who are at high risk of serious flu complications, such as people with asthma, diabetes (including gestational diabetes), or heart disease. Early treatment of influenza in hospitalized pregnant women has been shown to reduce the length of the hospital stay.  Symptoms Flu symptoms include fever, cough, sore throat, runny or stuffy nose, body aches, headache, chills and fatigue. Some people may also have vomiting and diarrhea. People may be infected with the flu and have respiratory symptoms without a fever.  Early Treatment is Important for Pregnant Women Treatment should begin as soon as possible because antiviral drugs work best when started early (within 48 hours after symptoms start). Antiviral drugs can make your flu illness milder and make you feel better faster. They may also prevent serious health problems that can result from flu illness. Oral oseltamivir (Tamiflu) is the preferred treatment for pregnant women because it has the most studies available to suggest that it is safe and beneficial. Antiviral drugs require a prescription from your  provider. Having a fever caused by flu infection or other infections early in pregnancy may be linked to birth defects in a baby. In addition to taking antiviral drugs, pregnant women who get a fever should treat their fever with Tylenol (acetaminophen) and contact their provider immediately.  When to [place] If you are pregnant and have any of these signs, seek care immediately:  Difficulty breathing or shortness of breath  Pain or pressure in the chest or abdomen  Sudden dizziness  Confusion  Severe or persistent vomiting  High fever that is not responding to Tylenol (or store brand equivalent)  Decreased or no movement of your baby  [email or web address]

## 2019-05-29 NOTE — Progress Notes (Signed)
   LOW-RISK PREGNANCY VISIT Patient name: Madison Decker MRN [id number]  Date of birth: 1990/05/31 Chief Complaint:   Routine Prenatal Visit  History of Present Illness:   Madison Decker is a 29 y.o. G1P0 female at [redacted]w[redacted]d with an Estimated Date of Delivery: 08/05/19 being seen today for ongoing management of a low-risk pregnancy.  Today she reports no complaints. Contractions: Not present. Vag. Bleeding: None.  Movement: Present. denies leaking of fluid. Review of Systems:   Pertinent items are noted in HPI Denies abnormal vaginal discharge w/ itching/odor/irritation, headaches, visual changes, shortness of breath, chest pain, abdominal pain, severe nausea/vomiting, or problems with urination or bowel movements unless otherwise stated above. Pertinent History Reviewed:  Reviewed past medical,surgical, social, obstetrical and family history.  Reviewed problem list, medications and allergies. Physical Assessment:   Vitals:   05/29/19 0856  BP: 115/65  Pulse: 79  Weight: 186 lb (84.4 kg)  Body mass index is 30.02 kg/m.        Physical Examination:   General appearance: Well appearing, and in no distress  Mental status: Alert, oriented to person, place, and time  Skin: Warm & dry  Cardiovascular: Normal heart rate noted  Respiratory: Normal respiratory effort, no distress  Abdomen: Soft, gravid, nontender  Pelvic: Cervical exam deferred         Extremities: Edema: None  Fetal Status: Fetal Heart Rate (bpm): 141 Fundal Height: 28 cm Movement: Present    Results for orders placed or performed in visit on 05/29/19 (from the past 24 hour(s))  POC Urinalysis Dipstick OB   Collection Time: 05/29/19 10:39 AM  Result Value Ref Range   Color, UA     Clarity, UA     Glucose, UA Negative Negative   Bilirubin, UA     Ketones, UA trace    Spec Grav, UA     Blood, UA trace    pH, UA     POC,PROTEIN,UA Trace Negative, Trace, Small (1+), Moderate (2+), Large (3+), 4+   Urobilinogen, UA      Nitrite, UA neg    Leukocytes, UA Negative Negative   Appearance     Odor      Assessment & Plan:  1) Low-risk pregnancy G1P0 at [redacted]w[redacted]d with an Estimated Date of Delivery: 08/05/19   2) Watch FH, may need to plan growth scan if still S<D   Meds: No orders of the defined types were placed in this encounter.  Labs/procedures today: none  Plan:  Continue routine obstetrical care   Reviewed: Preterm labor symptoms and general obstetric precautions including but not limited to vaginal bleeding, contractions, leaking of fluid and fetal movement were reviewed in detail with the patient.  All questions were answered. Has home bp cuff. Check bp weekly, let us know if >140/90.   Follow-up: Return in about 2 weeks (around 06/12/2019) for LROB, in person.  Orders Placed This Encounter  Procedures  . POC Urinalysis Dipstick OB   Arabella Merles CNM 05/29/2019 1:20 PM

## 2019-06-12 ENCOUNTER — Ambulatory Visit (INDEPENDENT_AMBULATORY_CARE_PROVIDER_SITE_OTHER): Payer: Medicaid Other | Admitting: Obstetrics & Gynecology

## 2019-06-12 ENCOUNTER — Encounter: Payer: Self-pay | Admitting: Obstetrics & Gynecology

## 2019-06-12 ENCOUNTER — Other Ambulatory Visit: Payer: Self-pay

## 2019-06-12 VITALS — BP 117/72 | HR 84 | Wt 192.0 lb

## 2019-06-12 DIAGNOSIS — Z3A32 32 weeks gestation of pregnancy: Secondary | ICD-10-CM

## 2019-06-12 DIAGNOSIS — Z3403 Encounter for supervision of normal first pregnancy, third trimester: Secondary | ICD-10-CM

## 2019-06-12 NOTE — Progress Notes (Signed)
.     LOW-RISK PREGNANCY VISIT Patient name: Madison Decker MRN [id number]  Date of birth: 1991-03-25 Chief Complaint:   Routine Prenatal Visit  History of Present Illness:   Madison Decker is a 29 y.o. G1P0 female at [redacted]w[redacted]d with an Estimated Date of Delivery: 08/05/19 being seen today for ongoing management of a low-risk pregnancy.  Today she reports no complaints. Contractions: Not present. Vag. Bleeding: None.  Movement: Present. denies leaking of fluid. Review of Systems:   Pertinent items are noted in HPI Denies abnormal vaginal discharge w/ itching/odor/irritation, headaches, visual changes, shortness of breath, chest pain, abdominal pain, severe nausea/vomiting, or problems with urination or bowel movements unless otherwise stated above. Pertinent History Reviewed:  Reviewed past medical,surgical, social, obstetrical and family history.  Reviewed problem list, medications and allergies. Physical Assessment:   Vitals:   06/12/19 1514  BP: 117/72  Pulse: 84  Weight: 192 lb (87.1 kg)  Body mass index is 30.99 kg/m.        Physical Examination:   General appearance: Well appearing, and in no distress  Mental status: Alert, oriented to person, place, and time  Skin: Warm & dry  Cardiovascular: Normal heart rate noted  Respiratory: Normal respiratory effort, no distress  Abdomen: Soft, gravid, nontender  Pelvic: Cervical exam deferred         Extremities: Edema: None  Fetal Status: Fetal Heart Rate (bpm): 140 Fundal Height: 30 cm Movement: Present    Chaperone: n/a    No results found for this or any previous visit (from the past 24 hour(s)).  Assessment & Plan:  1) Low-risk pregnancy G1P0 at [redacted]w[redacted]d with an Estimated Date of Delivery: 08/05/19   2) watch FH progression,    Meds: No orders of the defined types were placed in this encounter.  Labs/procedures today:   Plan:  Continue routine obstetrical care  Next visit: prefers in person    Reviewed: Preterm labor  symptoms and general obstetric precautions including but not limited to vaginal bleeding, contractions, leaking of fluid and fetal movement were reviewed in detail with the patient.  All questions were answered.  home bp cuff. Rx faxed to . Check bp weekly, let us know if >140/90.   Follow-up: Return in about 2 weeks (around 06/26/2019) for LROB.  No orders of the defined types were placed in this encounter.  Lazaro Arms  06/12/2019 3:29 PM

## 2019-06-26 ENCOUNTER — Encounter: Payer: Self-pay | Admitting: Women's Health

## 2019-06-26 ENCOUNTER — Ambulatory Visit (INDEPENDENT_AMBULATORY_CARE_PROVIDER_SITE_OTHER): Payer: Medicaid Other | Admitting: Women's Health

## 2019-06-26 ENCOUNTER — Other Ambulatory Visit: Payer: Self-pay

## 2019-06-26 VITALS — BP 121/74 | HR 75 | Wt 189.6 lb

## 2019-06-26 DIAGNOSIS — Z1389 Encounter for screening for other disorder: Secondary | ICD-10-CM

## 2019-06-26 DIAGNOSIS — Z331 Pregnant state, incidental: Secondary | ICD-10-CM

## 2019-06-26 DIAGNOSIS — Z3403 Encounter for supervision of normal first pregnancy, third trimester: Secondary | ICD-10-CM

## 2019-06-26 DIAGNOSIS — O26843 Uterine size-date discrepancy, third trimester: Secondary | ICD-10-CM

## 2019-06-26 DIAGNOSIS — Z3A34 34 weeks gestation of pregnancy: Secondary | ICD-10-CM

## 2019-06-26 LAB — POCT URINALYSIS DIPSTICK OB
Blood, UA: NEGATIVE
Glucose, UA: NEGATIVE
Ketones, UA: NEGATIVE
Leukocytes, UA: NEGATIVE
Nitrite, UA: NEGATIVE

## 2019-06-26 NOTE — Patient Instructions (Addendum)
Madison Decker, I greatly value your feedback.  If you receive a survey following your visit with [place] today, we appreciate you taking the time to fill it out.  Thanks, Madison Decker, CNM, [facility]  [facility] HAS MOVED!!! It is now [facility] ([address] [place], [place] [address]) Entrance located off of [address] Free 24/7 valet parking   Go to [email or web address] to register for FREE online childbirth classes    Call the office [phone]) or go to [facility] if:  You begin to have strong, frequent contractions  Your water breaks.  Sometimes it is a big gush of fluid, sometimes it is just a trickle that keeps getting your panties wet or running down your legs  You have vaginal bleeding.  It is normal to have a small amount of spotting if your cervix was checked.   You don't feel your baby moving like normal.  If you don't, get you something to eat and drink and lay down and focus on feeling your baby move.  You should feel at least 10 movements in 2 hours.  If you don't, you should call the office or go to [facility].   Home Blood Pressure Monitoring for Patients   Your provider has recommended that you check your blood pressure (BP) at least once a week at home. If you do not have a blood pressure cuff at home, one will be provided for you. Contact your provider if you have not received your monitor within 1 week.   Helpful Tips for Accurate Home Blood Pressure Checks  . Don't smoke, exercise, or drink caffeine 30 minutes before checking your BP . Use the restroom before checking your BP (a full bladder can raise your pressure) . Relax in a comfortable upright chair . Feet on the ground . Left arm resting comfortably on a flat surface at the level of your heart . Legs uncrossed . Back supported . Sit quietly and don't talk . Place the cuff on your bare arm . Adjust snuggly, so that only two fingertips can fit between your skin  and the top of the cuff . Check 2 readings separated by at least one minute . Keep a log of your BP readings . For a visual, please reference this diagram: http://ccnc.care/bpdiagram  Provider Name: [facility]     Phone: [phone]  Zone 1: ALL CLEAR  Continue to monitor your symptoms:  . BP reading is less than 140 (top number) or less than 90 (bottom number)  . No right upper stomach pain . No headaches or seeing spots . No feeling nauseated or throwing up . No swelling in face and hands  Zone 2: CAUTION Call your doctor's office for any of the following:  . BP reading is greater than 140 (top number) or greater than 90 (bottom number)  . Stomach pain under your ribs in the middle or right side . Headaches or seeing spots . Feeling nauseated or throwing up . Swelling in face and hands  Zone 3: EMERGENCY  Seek immediate medical care if you have any of the following:  . BP reading is greater than160 (top number) or greater than 110 (bottom number) . Severe headaches not improving with Tylenol . Serious difficulty catching your breath . Any worsening symptoms from Zone 2   [place] Pediatricians/Family Doctors:  [facility] [phone]            [facility] [phone]  [facility] [phone] (usually not accepting new patients unless you have family there already, you are always welcome to call and ask)       [facility] Department [phone]       [facility] Pediatricians/Family Doctors:   [facility]: [phone]  [facility]: [phone]  [facility]: [phone]  [facility] Doctors:   [facility]: [phone]   [facility]: [phone]  [facility] Doctors:  [facility]: [phone]     Madison Decker Contractions Contractions of the uterus can occur throughout  pregnancy, but they are not always a sign that you are in labor. You may have practice contractions called Braxton Hicks contractions. These false labor contractions are sometimes confused with true labor. What are Madison Decker contractions? Braxton Hicks contractions are tightening movements that occur in the muscles of the uterus before labor. Unlike true labor contractions, these contractions do not result in opening (dilation) and thinning of the cervix. Toward the end of pregnancy (32-34 weeks), Braxton Hicks contractions can happen more often and may become stronger. These contractions are sometimes difficult to tell apart from true labor because they can be very uncomfortable. You should not feel embarrassed if you go to the hospital with false labor. Sometimes, the only way to tell if you are in true labor is for your health care provider to look for changes in the cervix. The health care provider will do a physical exam and may monitor your contractions. If you are not in true labor, the exam should show that your cervix is not dilating and your water has not broken. If there are no other health problems associated with your pregnancy, it is completely safe for you to be sent home with false labor. You may continue to have Braxton Hicks contractions until you go into true labor. How to tell the difference between true labor and false labor True labor  Contractions last 30-70 seconds.  Contractions become very regular.  Discomfort is usually felt in the top of the uterus, and it spreads to the lower abdomen and low back.  Contractions do not go away with walking.  Contractions usually become more intense and increase in frequency.  The cervix dilates and gets thinner. False labor  Contractions are usually shorter and not as strong as true labor contractions.  Contractions are usually irregular.  Contractions are often felt in the front of the lower abdomen and in the  groin.  Contractions may go away when you walk around or change positions while lying down.  Contractions get weaker and are shorter-lasting as time goes on.  The cervix usually does not dilate or become thin. Follow these instructions at home:   Take over-the-counter and prescription medicines only as told by your health care provider.  Keep up with your usual exercises and follow other instructions from your health care provider.  Eat and drink lightly if you think you are going into labor.  If Braxton Hicks contractions are making you uncomfortable: ? Change your position from lying down or resting to walking, or change from walking to resting. ? Sit and rest in a tub of warm water. ? Drink enough fluid to keep your urine pale yellow. Dehydration may cause these contractions. ? Do slow and deep breathing several times an hour.  Keep all follow-up prenatal visits as told by your health care provider. This is important. Contact a health care provider if:  You have a fever.  You have continuous pain in your abdomen. Get help right away if:  Your contractions become stronger, more regular, and closer together.  You have fluid leaking or gushing from your vagina.  You pass blood-tinged mucus (bloody show).  You have bleeding from your vagina.  You have low back pain that you never had before.  You feel your baby's head pushing down and causing pelvic pressure.  Your baby is not moving inside you as much as it used to. Summary  Contractions that occur before labor are called Braxton Hicks contractions, false labor, or practice contractions.  Braxton Hicks contractions are usually shorter, weaker, farther apart, and less regular than true labor contractions. True labor contractions usually become progressively stronger and regular, and they become more frequent.  Manage discomfort from [facility] contractions by changing position, resting in a warm bath, drinking  plenty of water, or practicing deep breathing. This information is not intended to replace advice given to you by your health care provider. Make sure you discuss any questions you have with your health care provider. Document Revised: 03/12/2017 Document Reviewed: 08/13/2016 Elsevier Patient Education  [place].

## 2019-06-26 NOTE — Progress Notes (Signed)
   LOW-RISK PREGNANCY VISIT Patient name: Madison Decker MRN [id number]  Date of birth: [birth year] Chief Complaint:   Routine Prenatal Visit  History of Present Illness:   Madison Decker is a 29 y.o. G1P0 female at [redacted]w[redacted]d with an Estimated Date of Delivery: 08/05/19 being seen today for ongoing management of a low-risk pregnancy.  Today she reports no complaints. Contractions: Not present. Vag. Bleeding: None.  Movement: Present. denies leaking of fluid. Review of Systems:   Pertinent items are noted in HPI Denies abnormal vaginal discharge w/ itching/odor/irritation, headaches, visual changes, shortness of breath, chest pain, abdominal pain, severe nausea/vomiting, or problems with urination or bowel movements unless otherwise stated above. Pertinent History Reviewed:  Reviewed past medical,surgical, social, obstetrical and family history.  Reviewed problem list, medications and allergies. Physical Assessment:   Vitals:   06/26/19 0920  BP: 121/74  Pulse: 75  Weight: 189 lb 9.6 oz (86 kg)  Body mass index is 30.6 kg/m.        Physical Examination:   General appearance: Well appearing, and in no distress  Mental status: Alert, oriented to person, place, and time  Skin: Warm & dry  Cardiovascular: Normal heart rate noted  Respiratory: Normal respiratory effort, no distress  Abdomen: Soft, gravid, nontender  Pelvic: Cervical exam deferred         Extremities: Edema: Trace  Fetal Status: Fetal Heart Rate (bpm): 150 Fundal Height: 30 cm Movement: Present    Chaperone: n/a    Results for orders placed or performed in visit on 06/26/19 (from the past 24 hour(s))  POC Urinalysis Dipstick OB   Collection Time: 06/26/19  9:21 AM  Result Value Ref Range   Color, UA     Clarity, UA     Glucose, UA Negative Negative   Bilirubin, UA     Ketones, UA neg    Spec Grav, UA     Blood, UA neg    pH, UA     POC,PROTEIN,UA Trace Negative, Trace, Small (1+), Moderate (2+), Large (3+), 4+     Urobilinogen, UA     Nitrite, UA neg    Leukocytes, UA Negative Negative   Appearance     Odor      Assessment & Plan:  1) Low-risk pregnancy G1P0 at [redacted]w[redacted]d with an Estimated Date of Delivery: 08/05/19   2) Uterine size <dates, will get efw/afi u/s   Meds: No orders of the defined types were placed in this encounter.  Labs/procedures today: none  Plan:  Continue routine obstetrical care  Next visit: prefers will be in person for gbs    Reviewed: Preterm labor symptoms and general obstetric precautions including but not limited to vaginal bleeding, contractions, leaking of fluid and fetal movement were reviewed in detail with the patient.  All questions were answered. Has home bp cuff.  Check bp weekly, let us know if >140/90.   Follow-up: Return for ASAP, US:EFW (no visit), then 2wks LROB in person w/ CNM.  Orders Placed This Encounter  Procedures  . US OB Follow Up  . POC Urinalysis Dipstick OB   Cheral Marker CNM, [facility] 06/26/2019 9:44 AM

## 2019-06-29 ENCOUNTER — Other Ambulatory Visit: Payer: Self-pay

## 2019-06-29 ENCOUNTER — Ambulatory Visit (INDEPENDENT_AMBULATORY_CARE_PROVIDER_SITE_OTHER): Payer: Medicaid Other

## 2019-06-29 DIAGNOSIS — Z3A34 34 weeks gestation of pregnancy: Secondary | ICD-10-CM

## 2019-06-29 DIAGNOSIS — Z3403 Encounter for supervision of normal first pregnancy, third trimester: Secondary | ICD-10-CM

## 2019-06-29 DIAGNOSIS — O26843 Uterine size-date discrepancy, third trimester: Secondary | ICD-10-CM | POA: Diagnosis not present

## 2019-06-29 NOTE — Progress Notes (Signed)
[place] 34+5 wks,cephalic,posterior placenta gr 3,fhr 152 bpm,normal ovaries,EFW 2551 g 52%

## 2019-07-10 ENCOUNTER — Encounter: Payer: Self-pay | Admitting: Obstetrics & Gynecology

## 2019-07-10 ENCOUNTER — Other Ambulatory Visit: Payer: Self-pay

## 2019-07-10 ENCOUNTER — Other Ambulatory Visit (HOSPITAL_COMMUNITY)
Admission: RE | Admit: 2019-07-10 | Discharge: 2019-07-10 | Disposition: A | Discharge status: Home / Self Care | Payer: Medicaid Other | Source: Ambulatory Visit | Attending: Obstetrics & Gynecology | Admitting: Obstetrics & Gynecology

## 2019-07-10 ENCOUNTER — Ambulatory Visit (INDEPENDENT_AMBULATORY_CARE_PROVIDER_SITE_OTHER): Payer: Medicaid Other | Admitting: Obstetrics & Gynecology

## 2019-07-10 VITALS — BP 117/74 | HR 75 | Wt 194.0 lb

## 2019-07-10 DIAGNOSIS — Z3A36 36 weeks gestation of pregnancy: Secondary | ICD-10-CM

## 2019-07-10 DIAGNOSIS — Z3403 Encounter for supervision of normal first pregnancy, third trimester: Secondary | ICD-10-CM | POA: Diagnosis present

## 2019-07-10 NOTE — Progress Notes (Signed)
   LOW-RISK PREGNANCY VISIT Patient name: Madison Decker MRN [id number]  Date of birth: 11/20/1990 Chief Complaint:   Routine Prenatal Visit  History of Present Illness:   Madison Decker is a 29 y.o. G1P0 female at [redacted]w[redacted]d with an Estimated Date of Delivery: 08/05/19 being seen today for ongoing management of a low-risk pregnancy.  Today she reports no complaints. Contractions: Not present. Vag. Bleeding: None.  Movement: Present. denies leaking of fluid. Review of Systems:   Pertinent items are noted in HPI Denies abnormal vaginal discharge w/ itching/odor/irritation, headaches, visual changes, shortness of breath, chest pain, abdominal pain, severe nausea/vomiting, or problems with urination or bowel movements unless otherwise stated above. Pertinent History Reviewed:  Reviewed past medical,surgical, social, obstetrical and family history.  Reviewed problem list, medications and allergies. Physical Assessment:   Vitals:   07/10/19 1020  BP: 117/74  Pulse: 75  Weight: 194 lb (88 kg)  Body mass index is 31.31 kg/m.        Physical Examination:   General appearance: Well appearing, and in no distress  Mental status: Alert, oriented to person, place, and time  Skin: Warm & dry  Cardiovascular: Normal heart rate noted  Respiratory: Normal respiratory effort, no distress  Abdomen: Soft, gravid, nontender  Pelvic: Cervical exam deferred         Extremities: Edema: Trace  Fetal Status: Fetal Heart Rate (bpm): 130 Fundal Height: 36 cm Movement: Present    Chaperone: n/a    No results found for this or any previous visit (from the past 24 hour(s)).  Assessment & Plan:  1) Low-risk pregnancy G1P0 at [redacted]w[redacted]d with an Estimated Date of Delivery: 08/05/19      Meds: No orders of the defined types were placed in this encounter.  Labs/procedures today: cultures  Plan:  Continue routine obstetrical care  Next visit: prefers video visit is fine    Reviewed: Term labor symptoms and  general obstetric precautions including but not limited to vaginal bleeding, contractions, leaking of fluid and fetal movement were reviewed in detail with the patient.  All questions were answered. Has home bp cuff. Rx faxed to . Check bp weekly, let us know if >140/90.   Follow-up: Return in about 1 week (around 07/17/2019) for MyChart Connect visit, LROB.  Orders Placed This Encounter  Procedures  . Culture, beta strep (group b only)   Lazaro Arms  07/10/2019 10:49 AM

## 2019-07-11 LAB — CERVICOVAGINAL ANCILLARY ONLY
Chlamydia: NEGATIVE
Comment: NEGATIVE
Comment: NORMAL
Neisseria Gonorrhea: NEGATIVE

## 2019-07-14 LAB — CULTURE, BETA STREP (GROUP B ONLY): Strep Gp B Culture: NEGATIVE

## 2019-07-16 ENCOUNTER — Encounter (HOSPITAL_COMMUNITY): Payer: Self-pay | Admitting: Family Medicine

## 2019-07-16 ENCOUNTER — Inpatient Hospital Stay (HOSPITAL_COMMUNITY)
Admission: AD | Admit: 2019-07-16 | Discharge: 2019-07-19 | DRG: 807 | Disposition: A | Discharge status: Home / Self Care | Payer: Medicaid Other | Attending: Family Medicine | Admitting: Family Medicine

## 2019-07-16 ENCOUNTER — Other Ambulatory Visit: Payer: Self-pay

## 2019-07-16 DIAGNOSIS — O4292 Full-term premature rupture of membranes, unspecified as to length of time between rupture and onset of labor: Secondary | ICD-10-CM | POA: Diagnosis present

## 2019-07-16 DIAGNOSIS — Z20822 Contact with and (suspected) exposure to covid-19: Secondary | ICD-10-CM | POA: Diagnosis present

## 2019-07-16 DIAGNOSIS — O4290 Premature rupture of membranes, unspecified as to length of time between rupture and onset of labor, unspecified weeks of gestation: Secondary | ICD-10-CM

## 2019-07-16 DIAGNOSIS — Z3403 Encounter for supervision of normal first pregnancy, third trimester: Secondary | ICD-10-CM

## 2019-07-16 DIAGNOSIS — Z3400 Encounter for supervision of normal first pregnancy, unspecified trimester: Secondary | ICD-10-CM

## 2019-07-16 DIAGNOSIS — Z3A37 37 weeks gestation of pregnancy: Secondary | ICD-10-CM | POA: Diagnosis not present

## 2019-07-16 LAB — CBC
HCT: 37.7 % (ref 36.0–46.0)
Hemoglobin: 12.7 g/dL (ref 12.0–15.0)
MCH: 30.4 pg (ref 26.0–34.0)
MCHC: 33.7 g/dL (ref 30.0–36.0)
MCV: 90.2 fL (ref 80.0–100.0)
Platelets: 216 10*3/uL (ref 150–400)
RBC: 4.18 MIL/uL (ref 3.87–5.11)
RDW: 13.6 % (ref 11.5–15.5)
WBC: 6.3 10*3/uL (ref 4.0–10.5)
nRBC: 0 % (ref 0.0–0.2)

## 2019-07-16 LAB — TYPE AND SCREEN
ABO/RH(D): A POS
Antibody Screen: NEGATIVE

## 2019-07-16 LAB — ABO/RH: ABO/RH(D): A POS

## 2019-07-16 MED ORDER — FENTANYL CITRATE (PF) 100 MCG/2ML IJ SOLN
100.0000 ug | INTRAMUSCULAR | Status: DC | PRN
  Administered 2019-07-17 (×2): 100 ug via INTRAVENOUS
  Filled 2019-07-16 (×2): qty 2

## 2019-07-16 MED ORDER — TERBUTALINE SULFATE 1 MG/ML IJ SOLN: 0.2500 mg | Freq: Once | INTRAMUSCULAR | Status: DC | PRN

## 2019-07-16 MED ORDER — OXYTOCIN 40 UNITS IN NORMAL SALINE INFUSION - SIMPLE MED
2.5000 [IU]/h | INTRAVENOUS | Status: DC
  Filled 2019-07-16: qty 1000

## 2019-07-16 MED ORDER — MISOPROSTOL 50MCG HALF TABLET
50.0000 ug | ORAL_TABLET | ORAL | Status: DC | PRN
  Administered 2019-07-16 – 2019-07-17 (×3): 50 ug via BUCCAL
  Filled 2019-07-16 (×3): qty 1

## 2019-07-16 MED ORDER — ACETAMINOPHEN 325 MG PO TABS
650.0000 mg | ORAL_TABLET | ORAL | Status: DC | PRN
  Administered 2019-07-17: 01:00:00 650 mg via ORAL
  Filled 2019-07-16: qty 2

## 2019-07-16 MED ORDER — LACTATED RINGERS IV SOLN
500.0000 mL | INTRAVENOUS | Status: DC | PRN
  Administered 2019-07-17: 1000 mL via INTRAVENOUS

## 2019-07-16 MED ORDER — LACTATED RINGERS IV SOLN: INTRAVENOUS | Status: DC

## 2019-07-16 MED ORDER — LIDOCAINE HCL (PF) 1 % IJ SOLN
30.0000 mL | INTRAMUSCULAR | Status: AC | PRN
  Administered 2019-07-17: 14:00:00 30 mL via SUBCUTANEOUS
  Filled 2019-07-16: qty 30

## 2019-07-16 MED ORDER — OXYTOCIN BOLUS FROM INFUSION
500.0000 mL | Freq: Once | INTRAVENOUS | Status: AC
  Administered 2019-07-17: 500 mL via INTRAVENOUS

## 2019-07-16 MED ORDER — ONDANSETRON HCL 4 MG/2ML IJ SOLN: 4.0000 mg | Freq: Four times a day (QID) | INTRAMUSCULAR | Status: DC | PRN

## 2019-07-16 MED ORDER — SOD CITRATE-CITRIC ACID 500-334 MG/5ML PO SOLN: 30.0000 mL | ORAL | Status: DC | PRN

## 2019-07-16 NOTE — MAU Note (Signed)
Madison Decker is a 29 y.o. at [redacted]w[redacted]d here in MAU reporting: last night and today she has been feeling fluid leak out. States it is very clear and watery. Is wearing a pad and has not had to change it. No bleeding. No pain. +FM. No recent IC.   Onset of complaint: last night  Pain score: 0/10  Vitals:   07/16/19 1832  BP: 120/82  Pulse: 85  Resp: 16  Temp: 98.7 F (37.1 C)  SpO2: 100%     FHT: +FM  Lab orders placed from triage: none

## 2019-07-16 NOTE — H&P (Addendum)
OBSTETRIC ADMISSION HISTORY AND PHYSICAL  Madison Decker is a 29 y.o. female G1P0 with IUP at [redacted]w[redacted]d by LMP c/w 6 wk [place] presenting for PROM. Reports waking up at 0300 feeling like she urinated on herself and leaking since that time. She reports +FMs, no VB, no blurry vision, headaches or peripheral edema, and RUQ pain.  She plans on bottle feeding. She requests OCPs for birth control. She received her prenatal care at [place]: 3/18   [place] 69+4 wks,cephalic,posterior placenta gr 3,fhr 152 bpm,normal ovaries,EFW 2551 g 52%  Prenatal History/Complications: PROM ~ 16 hours on admission   Past Medical History: Past Medical History:  Diagnosis Date  . Medical history non-contributory     Past Surgical History: Past Surgical History:  Procedure Laterality Date  . ARTHROSCOPIC REPAIR ACL Right     Obstetrical History: OB History    Gravida  1   Para      Term      Preterm      AB      Living        SAB      TAB      Ectopic      Multiple      Live Births              Social History: Social History   Socioeconomic History  . Marital status: Single    Spouse name: Not on file  . Number of children: Not on file  . Years of education: Not on file  . Highest education level: Some college, no degree  Occupational History  . Not on file  Tobacco Use  . Smoking status: Never Smoker  . Smokeless tobacco: Never Used  Substance and Sexual Activity  . Alcohol use: No  . Drug use: No  . Sexual activity: Yes    Birth control/protection: None  Other Topics Concern  . Not on file  Social History Narrative  . Not on file   Social Determinants of Health   Financial Resource Strain: Low Risk   . Difficulty of Paying Living Expenses: Not hard at all  Food Insecurity: No Food Insecurity  . Worried About Charity fundraiser in the Last Year: Never true  . Ran Out of Food in the Last Year: Never true  Transportation Needs: No Transportation Needs  .  Lack of Transportation (Medical): No  . Lack of Transportation (Non-Medical): No  Physical Activity: Inactive  . Days of Exercise per Week: 0 days  . Minutes of Exercise per Session: 0 min  Stress: No Stress Concern Present  . Feeling of Stress : Not at all  Social Connections: Moderately Isolated  . Frequency of Communication with Friends and Family: Three times a week  . Frequency of Social Gatherings with Friends and Family: More than three times a week  . Attends Religious Services: Never  . Active Member of Clubs or Organizations: No  . Attends Archivist Meetings: Never  . Marital Status: Never married    Family History: Family History  Problem Relation Age of Onset  . Cancer Maternal Grandmother        breast    Allergies: No Known Allergies  Medications Prior to Admission  Medication Sig Dispense Refill Last Dose  . Blood Pressure Monitor MISC For regular home bp monitoring during pregnancy 1 each 0   . Prenatal Vit-Fe Fumarate-FA (PRENATAL VITAMINS PO) Take by mouth.        Review  of Systems   All systems reviewed and negative except as stated in HPI  Blood pressure 120/82, pulse 85, temperature 98.7 F (37.1 C), temperature source Oral, resp. rate 16, height 5\' 6"  (1.676 m), weight 87 kg, last menstrual period 10/29/2018, SpO2 100 %. General appearance: alert, cooperative and appears stated age Lungs: normal effort Heart: regular rate  Abdomen: soft, non-tender; bowel sounds normal Pelvic: gravid uterus Spec exam: Grossly ruptured on speculum exam with pooling of fluids. GU: No vaginal lesions  Extremities: Homans sign is negative, no sign of DVT Presentation: cephalic Fetal monitoringBaseline: 145 bpm, Variability: Good {> 6 bpm), Accelerations: Reactive and Decelerations: Absent Uterine activity: None Dilation: Fingertip Effacement (%): Thick Exam by:: C.  MD   Prenatal labs: ABO, Rh: A/Positive/-- (10/16 1153) Antibody: Negative  (01/25 0848) Rubella: 2.37 (10/16 1153) RPR: Non Reactive (01/25 0848)  HBsAg: Negative (10/16 1153)  HIV: Non Reactive (01/25 0848)  GBS: Negative/-- (03/29 1530)  2 hr Glucola WNL Genetic screening  WNL Anatomy [birth year]1984 WNL  Prenatal Transfer Tool  Maternal Diabetes: No Genetic Screening: Normal Maternal Ultrasounds/Referrals: Normal Fetal Ultrasounds or other Referrals:  None Maternal Substance Abuse:  No Significant Maternal Medications:  None Significant Maternal Lab Results: Group B Strep negative  No results found for this or any previous visit (from the past 24 hour(s)).  Patient Active Problem List   Diagnosis Date Noted  . Supervision of normal first pregnancy 01/27/2019    Assessment/Plan:  Madison Decker is a 29 y.o. G1P0 at [redacted]w[redacted]d here for IOL secondary to PROM.  #Labor: Grossly ruptured on speculum exam with pooling of fluids. Vertex by exam and BSUS. Will give buccal Cytotec and plan for FB once on L&D. Pit PRN. Anticipate SVD. #Pain: Per patient request #FWB: Cat I; EFW: 3200g #ID:  GBS neg #MOF: Bottle #MOC: OCPs #Circ:  NA; girl   [redacted]w[redacted]d, MD [facility] Family [facility] Fellow, [facility] for [facility], [facility] 07/16/2019, 7:06 PM

## 2019-07-16 NOTE — Progress Notes (Signed)
Labor Progress Note Madison Decker is a 29 y.o. G1P0 at [redacted]w[redacted]d presented for IOL for SROM at 0300  S:  Patient comfortable, resting with mother  O:  BP 123/69   Pulse 62   Temp (!) 97.3 F (36.3 C) (Axillary)   Resp 16   Ht 5\' 6"  (1.676 m)   Wt 87 kg   LMP 10/29/2018   SpO2 100% Comment: room air  BMI 30.97 kg/m   Fetal Tracing:  Baseline: 145 Variability: moderate Accels: 15x15 Decels: none  Toco: ui   CVE: Dilation: Fingertip Effacement (%): Thick Cervical Position: Posterior Presentation: Vertex(confirmed by bedside ultrasound) Exam by:: C. Fair MD   A&P: 29 y.o. G1P0 [redacted]w[redacted]d IOL SROM #Labor: cytotec, will recheck at 0100. Discussed methods of induction at length with patient #Pain: per patient request #FWB: Cat 1 #GBS negative  [redacted]w[redacted]d, CNM 11:58 PM

## 2019-07-17 ENCOUNTER — Inpatient Hospital Stay (HOSPITAL_COMMUNITY): Payer: Medicaid Other | Admitting: Anesthesiology

## 2019-07-17 ENCOUNTER — Telehealth: Payer: Medicaid Other | Admitting: Obstetrics and Gynecology

## 2019-07-17 ENCOUNTER — Encounter (HOSPITAL_COMMUNITY): Payer: Self-pay | Admitting: Family Medicine

## 2019-07-17 DIAGNOSIS — Z3A37 37 weeks gestation of pregnancy: Secondary | ICD-10-CM

## 2019-07-17 LAB — SARS CORONAVIRUS 2 (TAT 6-24 HRS): SARS Coronavirus 2: NEGATIVE

## 2019-07-17 LAB — RPR: RPR Ser Ql: NONREACTIVE

## 2019-07-17 MED ORDER — SENNOSIDES-DOCUSATE SODIUM 8.6-50 MG PO TABS
2.0000 | ORAL_TABLET | ORAL | Status: DC
  Administered 2019-07-17 – 2019-07-18 (×2): 2 via ORAL
  Filled 2019-07-17 (×2): qty 2

## 2019-07-17 MED ORDER — ZOLPIDEM TARTRATE 5 MG PO TABS: 5.0000 mg | ORAL_TABLET | Freq: Every evening | ORAL | Status: DC | PRN

## 2019-07-17 MED ORDER — FENTANYL-BUPIVACAINE-NACL 0.5-0.125-0.9 MG/250ML-% EP SOLN
12.0000 mL/h | EPIDURAL | Status: DC | PRN
  Filled 2019-07-17: qty 250

## 2019-07-17 MED ORDER — PHENYLEPHRINE 40 MCG/ML (10ML) SYRINGE FOR IV PUSH (FOR BLOOD PRESSURE SUPPORT): 80.0000 ug | PREFILLED_SYRINGE | INTRAVENOUS | Status: DC | PRN

## 2019-07-17 MED ORDER — PHENYLEPHRINE 40 MCG/ML (10ML) SYRINGE FOR IV PUSH (FOR BLOOD PRESSURE SUPPORT)
80.0000 ug | PREFILLED_SYRINGE | INTRAVENOUS | Status: DC | PRN
  Filled 2019-07-17: qty 10

## 2019-07-17 MED ORDER — SODIUM CHLORIDE 0.9% FLUSH
10.0000 mL | Freq: Two times a day (BID) | INTRAVENOUS | Status: DC
  Administered 2019-07-17: 20 mL
  Administered 2019-07-18 (×2): 10 mL

## 2019-07-17 MED ORDER — LACTATED RINGERS IV SOLN: 500.0000 mL | Freq: Once | INTRAVENOUS | Status: DC

## 2019-07-17 MED ORDER — EPHEDRINE 5 MG/ML INJ: 10.0000 mg | INTRAVENOUS | Status: DC | PRN

## 2019-07-17 MED ORDER — LIDOCAINE-EPINEPHRINE (PF) 2 %-1:200000 IJ SOLN
INTRAMUSCULAR | Status: DC | PRN
  Administered 2019-07-17: 3 mL via EPIDURAL
  Administered 2019-07-17: 2 mL via EPIDURAL

## 2019-07-17 MED ORDER — SIMETHICONE 80 MG PO CHEW: 80.0000 mg | CHEWABLE_TABLET | ORAL | Status: DC | PRN

## 2019-07-17 MED ORDER — DIPHENHYDRAMINE HCL 25 MG PO CAPS: 25.0000 mg | ORAL_CAPSULE | Freq: Four times a day (QID) | ORAL | Status: DC | PRN

## 2019-07-17 MED ORDER — SODIUM CHLORIDE (PF) 0.9 % IJ SOLN
INTRAMUSCULAR | Status: DC | PRN
  Administered 2019-07-17: 12 mL/h via EPIDURAL

## 2019-07-17 MED ORDER — ONDANSETRON HCL 4 MG PO TABS: 4.0000 mg | ORAL_TABLET | ORAL | Status: DC | PRN

## 2019-07-17 MED ORDER — OXYTOCIN 40 UNITS IN NORMAL SALINE INFUSION - SIMPLE MED
1.0000 m[IU]/min | INTRAVENOUS | Status: DC
  Administered 2019-07-17: 2 m[IU]/min via INTRAVENOUS

## 2019-07-17 MED ORDER — ONDANSETRON HCL 4 MG/2ML IJ SOLN: 4.0000 mg | INTRAMUSCULAR | Status: DC | PRN

## 2019-07-17 MED ORDER — TETANUS-DIPHTH-ACELL PERTUSSIS 5-2.5-18.5 LF-MCG/0.5 IM SUSP: 0.5000 mL | Freq: Once | INTRAMUSCULAR | Status: DC

## 2019-07-17 MED ORDER — TERBUTALINE SULFATE 1 MG/ML IJ SOLN: 0.2500 mg | Freq: Once | INTRAMUSCULAR | Status: DC | PRN

## 2019-07-17 MED ORDER — BENZOCAINE-MENTHOL 20-0.5 % EX AERO
1.0000 "application " | INHALATION_SPRAY | CUTANEOUS | Status: DC | PRN
  Administered 2019-07-17: 1 via TOPICAL
  Filled 2019-07-17: qty 56

## 2019-07-17 MED ORDER — PRENATAL MULTIVITAMIN CH
1.0000 | ORAL_TABLET | Freq: Every day | ORAL | Status: DC
  Administered 2019-07-18: 12:00:00 1 via ORAL
  Filled 2019-07-17 (×2): qty 1

## 2019-07-17 MED ORDER — WITCH HAZEL-GLYCERIN EX PADS: 1.0000 "application " | MEDICATED_PAD | CUTANEOUS | Status: DC | PRN

## 2019-07-17 MED ORDER — DIPHENHYDRAMINE HCL 50 MG/ML IJ SOLN: 12.5000 mg | INTRAMUSCULAR | Status: DC | PRN

## 2019-07-17 MED ORDER — COCONUT OIL OIL: 1.0000 "application " | TOPICAL_OIL | Status: DC | PRN

## 2019-07-17 MED ORDER — IBUPROFEN 600 MG PO TABS
600.0000 mg | ORAL_TABLET | Freq: Four times a day (QID) | ORAL | Status: DC
  Administered 2019-07-17 – 2019-07-19 (×7): 600 mg via ORAL
  Filled 2019-07-17 (×8): qty 1

## 2019-07-17 MED ORDER — SODIUM CHLORIDE 0.9% FLUSH: 10.0000 mL | INTRAVENOUS | Status: DC | PRN

## 2019-07-17 MED ORDER — ACETAMINOPHEN 325 MG PO TABS: 650.0000 mg | ORAL_TABLET | ORAL | Status: DC | PRN

## 2019-07-17 MED ORDER — DIBUCAINE (PERIANAL) 1 % EX OINT: 1.0000 "application " | TOPICAL_OINTMENT | CUTANEOUS | Status: DC | PRN

## 2019-07-17 NOTE — Anesthesia Postprocedure Evaluation (Signed)
Anesthesia Post Note  Patient: Madison Decker  Procedure(s) Performed: AN AD HOC LABOR EPIDURAL     Patient location during evaluation: Mother Baby Anesthesia Type: Epidural Level of consciousness: awake Pain management: satisfactory to patient Vital Signs Assessment: post-procedure vital signs reviewed and stable Respiratory status: spontaneous breathing Cardiovascular status: stable Anesthetic complications: no    Last Vitals:  Vitals:   07/17/19 1615 07/17/19 1730  BP: 124/62 134/84  Pulse: 91 81  Resp: 16 18  Temp: 37 C 36.9 C  SpO2: 100% 100%    Last Pain:  Vitals:   07/17/19 1730  TempSrc: Oral  PainSc: 0-No pain   Pain Goal:                Epidural/Spinal Function Cutaneous sensation: Tingles (07/17/19 1730)  Cephus Shelling

## 2019-07-17 NOTE — Progress Notes (Addendum)
   Madison Decker is a 29 y.o. G1P0 at [redacted]w[redacted]d  admitted for PROM at 0300 on Sunday, April 4.   Subjective: Comfortable with epidural  Objective: Vitals:   07/17/19 0633 07/17/19 0700 07/17/19 0801 07/17/19 0935  BP: 113/65 116/65 132/87 140/85  Pulse: 67 70 69 74  Resp: 16  16   Temp: 98.1 F (36.7 C)     TempSrc: Axillary     SpO2:      Weight:      Height:       No intake/output data recorded.  FHT:  FHR: 135 bpm, variability: moderate,  accelerations:  Present,  decelerations:  Absent UC:   irregular, every 1-3 minutes SVE:   Dilation: 4.5 Effacement (%): 90 Station: -2 Exam by:: [employer] RN  Labs: Lab Results  Component Value Date   WBC 6.3 07/16/2019   HGB 12.7 07/16/2019   HCT 37.7 07/16/2019   MCV 90.2 07/16/2019   PLT 216 07/16/2019    Assessment / Plan: Early labor, now 4 cm so will start pitocin as patient is 90 percent effaced.   Labor: Progressing normally Fetal Wellbeing:  Category I Pain Control:  Epidural Anticipated MOD:  NSVD  Marylene Land 07/17/2019, 9:43 AM

## 2019-07-17 NOTE — Anesthesia Preprocedure Evaluation (Signed)
Anesthesia Evaluation  Patient identified by MRN, date of birth, ID band Patient awake    Reviewed: Allergy & Precautions, NPO status , Patient's Chart, lab work & pertinent test results  Airway Mallampati: II  TM Distance: >3 FB Neck ROM: Full    Dental no notable dental hx.    Pulmonary neg pulmonary ROS,    Pulmonary exam normal breath sounds clear to auscultation       Cardiovascular negative cardio ROS Normal cardiovascular exam Rhythm:Regular Rate:Normal     Neuro/Psych negative neurological ROS  negative psych ROS   GI/Hepatic negative GI ROS, Neg liver ROS,   Endo/Other  negative endocrine ROS  Renal/GU negative Renal ROS  negative genitourinary   Musculoskeletal negative musculoskeletal ROS (+)   Abdominal   Peds  Hematology negative hematology ROS (+)   Anesthesia Other Findings   Reproductive/Obstetrics (+) Pregnancy                             Anesthesia Physical Anesthesia Plan  ASA: II  Anesthesia Plan: Epidural   Post-op Pain Management:    Induction:   PONV Risk Score and Plan: Treatment may vary due to age or medical condition  Airway Management Planned: Natural Airway  Additional Equipment:   Intra-op Plan:   Post-operative Plan:   Informed Consent: I have reviewed the patients History and Physical, chart, labs and discussed the procedure including the risks, benefits and alternatives for the proposed anesthesia with the patient or authorized representative who has indicated his/her understanding and acceptance.       Plan Discussed with: Anesthesiologist  Anesthesia Plan Comments: (Patient identified. Risks, benefits, options discussed with patient including but not limited to bleeding, infection, nerve damage, paralysis, failed block, incomplete pain control, headache, blood pressure changes, nausea, vomiting, reactions to medication, itching, and  post partum back pain. Confirmed with bedside nurse the patient's most recent platelet count. Confirmed with the patient that they are not taking any anticoagulation, have any bleeding history or any family history of bleeding disorders. Patient expressed understanding and wishes to proceed. All questions were answered. )        Anesthesia Quick Evaluation  

## 2019-07-17 NOTE — Anesthesia Procedure Notes (Signed)
Epidural Patient location during procedure: OB Start time: 07/17/2019 5:15 AM End time: 07/17/2019 5:30 AM  Staffing Anesthesiologist: Elmer Picker, MD Performed: anesthesiologist   Preanesthetic Checklist Completed: patient identified, IV checked, risks and benefits discussed, monitors and equipment checked, pre-op evaluation and timeout performed  Epidural Patient position: sitting Prep: DuraPrep and site prepped and draped Patient monitoring: continuous pulse ox, blood pressure, heart rate and cardiac monitor Approach: midline Location: L3-L4 Injection technique: LOR air  Needle:  Needle type: Tuohy  Needle gauge: 17 G Needle length: 9 cm Needle insertion depth: 5 cm Catheter type: closed end flexible Catheter size: 19 Gauge Catheter at skin depth: 11 cm Test dose: negative  Assessment Sensory level: T8 Events: blood not aspirated, injection not painful, no injection resistance, no paresthesia and negative IV test  Additional Notes Patient identified. Risks/Benefits/Options discussed with patient including but not limited to bleeding, infection, nerve damage, paralysis, failed block, incomplete pain control, headache, blood pressure changes, nausea, vomiting, reactions to medication both or allergic, itching and postpartum back pain. Confirmed with bedside nurse the patient's most recent platelet count. Confirmed with patient that they are not currently taking any anticoagulation, have any bleeding history or any family history of bleeding disorders. Patient expressed understanding and wished to proceed. All questions were answered. Sterile technique was used throughout the entire procedure. Please see nursing notes for vital signs. Test dose was given through epidural catheter and negative prior to continuing to dose epidural or start infusion. Warning signs of high block given to the patient including shortness of breath, tingling/numbness in hands, complete motor block, or  any concerning symptoms with instructions to call for help. Patient was given instructions on fall risk and not to get out of bed. All questions and concerns addressed with instructions to call with any issues or inadequate analgesia.  Reason for block:procedure for pain

## 2019-07-17 NOTE — Discharge Summary (Signed)
Postpartum Discharge Summary     Patient Name: Madison Decker DOB: [birth year] MRN: [id number]  Date of admission: 07/16/2019 Delivering Provider: Starr [place]   Date of discharge: 07/19/2019  Admitting diagnosis: Labor and delivery, indication for care [O75.9] Intrauterine pregnancy: [redacted]w[redacted]d    Secondary diagnosis:  Active Problems:   Supervision of normal first pregnancy   PROM (premature rupture of membranes)   Labor and delivery, indication for care  Additional problems: None     Discharge diagnosis: Term Pregnancy Delivered                                                                                                Post partum procedures:none  Augmentation: Pitocin and Cytotec  Complications: None  Hospital course:  Onset of Labor With Vaginal Delivery     29y.o. yo G1P0 at 371w2das admitted in Latent Labor on 07/16/2019. She had premature rupture of membranes at 0300 on 07-16-2019. She was admitted in the evening of April 4, and received cytotec x 3 and pitocin Patient had an uncomplicated labor course as follows:  Membrane Rupture Time/Date: 3:00 AM ,07/16/2019   Intrapartum Procedures: Episiotomy: None [1]                                         Lacerations:    Patient had a delivery of a Viable infant. 07/17/2019  Information for the patient's newborn:  Madison, Decker[[id number]]Delivery Method: Vag-Spont     Pateint had an uncomplicated postpartum course.  She is ambulating, tolerating a regular diet, passing flatus, and urinating well. Patient is discharged home in stable condition on 07/19/19.  Delivery time: 1:36 PM    Magnesium Sulfate received: No BMZ received: No Rhophylac:N/A MMR:N/A Transfusion:No  Physical exam  Vitals:   07/18/19 0526 07/18/19 1532 07/18/19 2126 07/19/19 0518  BP: 119/79 121/72 123/82 122/73  Pulse: (!) 57 68 66 72  Resp: _0 Temp: 97.6 F (36.4 C) 97.8 F (36.6 C) 98.6 F (37 C) 97.7 F (36.5 C)   TempSrc: Oral Oral Oral Oral  SpO2: 100% 100% 100% 99%  Weight:      Height:       General: alert, cooperative and no distress Lochia: appropriate Uterine Fundus: firm Incision: N/A DVT Evaluation: No evidence of DVT seen on physical exam. No significant calf/ankle edema. Labs: Lab Results  Component Value Date   WBC 11.4 (H) 07/18/2019   HGB 12.1 07/18/2019   HCT 36.5 07/18/2019   MCV 91.0 07/18/2019   PLT 162 07/18/2019   No flowsheet data found. Edinburgh Score: Edinburgh Postnatal Depression Scale Screening Tool 07/17/2019  I have been able to laugh and see the funny side of things. 0  I have looked forward with enjoyment to things. 0  I have blamed myself unnecessarily when things went wrong. 0  I have been anxious or worried for no good reason. 0  I have felt scared or panicky for no good reason.  0  Things have been getting on top of me. 0  I have been so unhappy that I have had difficulty sleeping. 0  I have felt sad or miserable. 0  I have been so unhappy that I have been crying. 0  The thought of harming myself has occurred to me. 0  Edinburgh Postnatal Depression Scale Total 0    Discharge instruction: per After Visit Summary and "Baby and Me Booklet".  After visit meds:  Allergies as of 07/19/2019   No Known Allergies     Medication List    TAKE these medications   acetaminophen 325 MG tablet Commonly known as: Tylenol Take 2 tablets (650 mg total) by mouth every 4 (four) hours as needed (for pain scale < 4).   Blood Pressure Monitor Misc For regular home bp monitoring during pregnancy   ibuprofen 600 MG tablet Commonly known as: ADVIL Take 1 tablet (600 mg total) by mouth every 6 (six) hours.   PRENATAL VITAMINS PO Take by mouth.       Diet: routine diet  Activity: Advance as tolerated. Pelvic rest for 6 weeks.   Outpatient follow up:6 weeks Follow up Appt: Future Appointments  Date Time Provider [place]  08/21/2019  1:50 PM  Roma Schanz, CNM [facility]   Follow up Visit:    Please schedule this patient for Postpartum visit in: 4 weeks with the following provider: Any provider Virtual For C/S patients schedule nurse incision check in weeks 2 weeks: no Low risk pregnancy complicated by: NA Delivery mode:  Vacuum Anticipated Birth Control:  OCPs PP Procedures needed: NA  Schedule Integrated [place] visit: no     Newborn Data: Live born female  Birth Weight:  2750 grams APGAR: 14, 9  Newborn Delivery   Birth date/time: 07/17/2019 13:36:00 Delivery type: Vaginal, Spontaneous      Baby Feeding: Bottle Disposition:home with mother   07/19/2019 Clarnce Flock, MD

## 2019-07-17 NOTE — Progress Notes (Signed)
Labor Progress Note Brendolyn Stockley is a 29 y.o. G1P0 at 105w2d presented for SROM  S:  Patient comfortable  O:  BP 130/76   Pulse 70   Temp 98.9 F (37.2 C) (Oral)   Resp 15   Ht 5\' 6"  (1.676 m)   Wt 87 kg   LMP 10/29/2018   SpO2 99%   BMI 30.97 kg/m   Fetal Tracing:  Baseline: 130 Variability: moderate Accels: 15x15 Decels: none  Toco: none   CVE: Dilation: 2 Effacement (%): 50 Cervical Position: Posterior Station: -3 Presentation: Vertex Exam by:: H Sims RN   A&P: 29 y.o. G1P0 [redacted]w[redacted]d SROM #Labor: Continue cytotec. Patient refusing FB.  #Pain: per patient request #FWB: Cat 1 #GBS negative   [redacted]w[redacted]d, CNM

## 2019-07-17 NOTE — Progress Notes (Signed)
Labor Progress Note Madison Decker is a 29 y.o. G1P0 at [redacted]w[redacted]d presented for PROM @ 0300 on 4/4.  S: spoke w/ patient who is resting in bed.  Pt just had cervical check performed and does not currently want foley bulb placed.  She would like another dose of cytotec.   O:  BP 123/69   Pulse 62   Temp (!) 97.3 F (36.3 C) (Axillary)   Resp 16   Ht 5\' 6"  (1.676 m)   Wt 87 kg   LMP 10/29/2018   SpO2 100% Comment: room air  BMI 30.97 kg/m  EFM: 140/mod var. accels present  CVE: Dilation: 1.5 Effacement (%): Thick Cervical Position: Posterior Station: -3 Presentation: Vertex Exam by:: [email or web address] RN   A&P: 29 y.o. G1P0 [redacted]w[redacted]d w/ PROM,  #Labor: Progressing well. Currently 1.5cm dilated. Declined foley bulb. Will get another dose of oral cytotec now.  #Pain: per pt request #FWB: cat I #GBS negative  [redacted]w[redacted]d, MD 1:02 AM

## 2019-07-18 LAB — CBC
HCT: 36.5 % (ref 36.0–46.0)
Hemoglobin: 12.1 g/dL (ref 12.0–15.0)
MCH: 30.2 pg (ref 26.0–34.0)
MCHC: 33.2 g/dL (ref 30.0–36.0)
MCV: 91 fL (ref 80.0–100.0)
Platelets: 162 10*3/uL (ref 150–400)
RBC: 4.01 MIL/uL (ref 3.87–5.11)
RDW: 13.9 % (ref 11.5–15.5)
WBC: 11.4 10*3/uL — ABNORMAL HIGH (ref 4.0–10.5)
nRBC: 0 % (ref 0.0–0.2)

## 2019-07-18 NOTE — Progress Notes (Addendum)
POSTPARTUM PROGRESS NOTE  PPD:1 Subjective: Madison Decker is a 29 y.o. G1P1001 s/p NSVD at [redacted]w[redacted]d.  She reports she doing well. No acute events overnight. She denies any problems with ambulating, voiding or po intake. Denies nausea or vomiting. Pain is well controlled. Pt describes amount of vaginal bleeding as "more than her period."   Objective: Blood pressure 119/79, pulse (!) 57, temperature 97.6 F (36.4 C), temperature source Oral, resp. rate 18, height 5\' 6"  (1.676 m), weight 87 kg, last menstrual period 10/29/2018, SpO2 100 %, unknown if currently breastfeeding.  Physical Exam:  General: alert, cooperative and no distress Chest: no respiratory distress Abdomen: soft, non-tender  Uterine Fundus: firm, appropriately tender Extremities: No calf swelling or tenderness  bilateral pedal edema  Recent Labs    07/16/19 2004 07/18/19 0538  HGB 12.7 12.1  HCT 37.7 36.5   Vitals:   07/18/19 0140 07/18/19 0526  BP: 111/88 119/79  Pulse: 69 (!) 57  Resp: 18 18  Temp: (!) 97.5 F (36.4 C) 97.6 F (36.4 C)  SpO2: 100% 100%   Assessment/Plan: Madison Decker is a 29 y.o. G1P1001 s/p NSVD at [redacted]w[redacted]d.  Routine Postpartum Care: Doing well, pain well-controlled.  -- Continue routine care, lactation support  -- Contraception: OCPs -- Feeding: Formula-Bottle  Dispo: Plan for discharge tomorrow  [redacted]w[redacted]d, PA-S 07/18/2019, 7:25 AM  GME ATTESTATION:  I saw and evaluated the patient. I agree with the findings and the plan of care as documented in the student's note.  09/17/2019, DO OB Fellow, Faculty [facility] 07/18/2019 9:48 AM

## 2019-07-19 MED ORDER — IBUPROFEN 600 MG PO TABS: 600.0000 mg | ORAL_TABLET | Freq: Four times a day (QID) | ORAL | 0 refills | Status: DC

## 2019-07-19 MED ORDER — ACETAMINOPHEN 325 MG PO TABS: 650.0000 mg | ORAL_TABLET | ORAL | 0 refills | Status: DC | PRN

## 2019-07-19 NOTE — Discharge Instructions (Signed)

## 2019-08-05 ENCOUNTER — Inpatient Hospital Stay (HOSPITAL_COMMUNITY): Admission: RE | Admit: 2019-08-05 | Payer: Medicaid Other | Source: Home / Self Care

## 2019-08-21 ENCOUNTER — Telehealth (INDEPENDENT_AMBULATORY_CARE_PROVIDER_SITE_OTHER): Payer: Medicaid Other | Admitting: Women's Health

## 2019-08-21 ENCOUNTER — Other Ambulatory Visit: Payer: Self-pay

## 2019-08-21 ENCOUNTER — Encounter: Payer: Self-pay | Admitting: Women's Health

## 2019-08-21 DIAGNOSIS — Z30011 Encounter for initial prescription of contraceptive pills: Secondary | ICD-10-CM

## 2019-08-21 MED ORDER — CYCLOBENZAPRINE HCL 10 MG PO TABS: 10.0000 mg | ORAL_TABLET | Freq: Three times a day (TID) | ORAL | 1 refills | Status: DC | PRN

## 2019-08-21 MED ORDER — LO LOESTRIN FE 1 MG-10 MCG / 10 MCG PO TABS: 1.0000 | ORAL_TABLET | Freq: Every day | ORAL | 3 refills | Status: DC

## 2019-08-21 NOTE — Progress Notes (Signed)
TELEHEALTH VIRTUAL POSTPARTUM VISIT ENCOUNTER NOTE Patient name: Madison Decker MRN [id number]  Date of birth: 30-Jun-1990  I connected with patient on 08/21/19 at  1:50 PM EDT by MyChart video  and verified that I am speaking with the correct person using two identifiers. Due to COVID-19 recommendations, pt is not currently in our office.    I discussed the limitations, risks, security and privacy concerns of performing an evaluation and management service by telephone and the availability of in person appointments. I also discussed with the patient that there may be a patient responsible charge related to this service. The patient expressed understanding and agreed to proceed.  Chief Complaint:   Postpartum Care (interested in birth control pills)  History of Present Illness:   Madison Decker is a 29 y.o. G28P1001 African American female being evaluated today for a postpartum visit. She is 5 weeks postpartum following a spontaneous vaginal delivery at 37.2 gestational weeks after IOL for PROM. Anesthesia: epidural. I have fully reviewed the prenatal and intrapartum course. Pregnancy uncomplicated. Pain in Rt rib area, had it during pregnancy, went away and then just came back. Hurts to the touch. Ibuprofen helps some, then it comes back. Some swelling in feet.  Postpartum course has been uncomplicated. Bleeding period just started today. Bowel function is normal. Bladder function is normal.  Patient is not sexually active. Last sexual activity: prior to birth of baby.   No LMP recorded.  Baby's course has been uncomplicated. Baby is feeding by bottle   Edinburgh Postpartum Depression Screening: negative Edinburgh Postnatal Depression Scale - 08/21/19 1341      Edinburgh Postnatal Depression Scale:  In the Past 7 Days   I have been able to laugh and see the funny side of things.  0    I have looked forward with enjoyment to things.  1    I have blamed myself unnecessarily when things  went wrong.  0    I have been anxious or worried for no good reason.  0    I have felt scared or panicky for no good reason.  0    Things have been getting on top of me.  0    I have been so unhappy that I have had difficulty sleeping.  1    I have felt sad or miserable.  0    I have been so unhappy that I have been crying.  1    The thought of harming myself has occurred to me.  0    Edinburgh Postnatal Depression Scale Total  3      Review of Systems:   Pertinent items are noted in HPI Denies Abnormal vaginal discharge w/ itching/odor/irritation, headaches, visual changes, shortness of breath, chest pain, abdominal pain, severe nausea/vomiting, or problems with urination or bowel movements. Pertinent History Reviewed:  Reviewed past medical,surgical, obstetrical and family history.  Reviewed problem list, medications and allergies. OB History  Gravida Para Term Preterm AB Living  1 1 1     1   SAB TAB Ectopic Multiple Live Births        0 1    # Outcome Date GA Lbr Len/2nd Weight Sex Delivery Anes PTL Lv  1 Term 07/17/19 [redacted]w[redacted]d 03:12 / 01:01 6 lb 1 oz (2.75 kg) F Vag-Spont EPI  LIV   Physical Assessment:   Vitals:   08/21/19 1344  BP: 131/74  Pulse: 89  There is no height or weight on file to calculate BMI.  Physical Examination:  General:  Alert, oriented and cooperative.   Mental Status: Normal mood and affect perceived. Normal judgment and thought content.  Rest of physical exam deferred due to type of encounter       No results found for this or any previous visit (from the past 24 hour(s)).  Assessment & Plan:  1) Postpartum exam 2) 5 wks s/p SVB 3) Bottlefeeding 4) Depression screening 5) Contraception counseling 6) Rt rib pain> > MSK pain, rx flexeril  Essential components of care per ACOG recommendations:  1.  Mood and well being: Patient with negative depression screening today. Reviewed local resources for support.  . Patient does not use tobacco.   . H/O drug use? No    2. Infant care and feeding:  . Patient currently breastmilk feeding? No  . Social determinants of health (SDOH) reviewed in EPIC. No concerns  3. Sexuality, contraception and birth spacing . Patient does not want a pregnancy in the next year.  Desired family size is 2 children.  . Reviewed forms of contraception in tiered fashion. Patient desires OCP (estrogen/progesterone).  Does not smoke, no h/o HTN, DVT/PE, CVA, MI, or migraines w/ aura.  . Discussed birth spacing of 18 months  4. Sleep and fatigue . Encouraged family/partner/community support of 4 hrs of uninterrupted sleep to help with mood and fatigue  5. Physical Recovery  . Discussed patients delivery and complications . Patient had a periclitoral, perineal healing reviewed. Patient expressed understanding . Patient has urinary incontinence? No  . Patient is safe to resume physical and sexual activity, condoms x 2wks  6.  Health Maintenance . Last pap smear 03/13/19 and results were normal . Mammogram-n/a  7. No Chronic Disease  Meds:  Meds ordered this encounter  Medications  . LO LOESTRIN FE 1 MG-10 MCG / 10 MCG tablet    Sig: Take 1 tablet by mouth daily.    Dispense:  3 Package    Refill:  3    For co-pay card, pt to text "Lo Loestrin Fe " to [place] card must be run in second position  "other coverage code 3"  if denied d/t PA, step edit, or insurance denial    Order Specific Question:   Supervising Provider    Answer:   Elonda Husky, LUTHER H [[id number]]  . cyclobenzaprine (FLEXERIL) 10 MG tablet    Sig: Take 1 tablet (10 mg total) by mouth every 8 (eight) hours as needed for muscle spasms.    Dispense:  30 tablet    Refill:  1    Order Specific Question:   Supervising Provider    Answer:   Florian Buff [[id number]]    The patient was advised to call back or seek an in-person evaluation/go to the ED for any concerning postpartum symptoms.  I provided 20 minutes of non-face-to-face  time during this encounter.  Follow-up: Return in about 3 months (around 11/21/2019) for F/U meds, CNM, MyChart Video.   No orders of the defined types were placed in this encounter.   [place], [facility] 08/21/2019 2:33 PM

## 2019-11-16 ENCOUNTER — Telehealth: Payer: Self-pay | Admitting: Women's Health

## 2019-11-16 NOTE — Telephone Encounter (Signed)
Pt is having postpartum depression , pt said she does not want to hurt herself or anyone else at this time/ spoke to [place] she said okay to take a message

## 2019-11-20 ENCOUNTER — Ambulatory Visit (INDEPENDENT_AMBULATORY_CARE_PROVIDER_SITE_OTHER): Payer: Medicaid Other | Admitting: Advanced Practice Midwife

## 2019-11-20 ENCOUNTER — Encounter: Payer: Self-pay | Admitting: Advanced Practice Midwife

## 2019-11-20 VITALS — BP 129/86 | HR 98 | Ht 65.0 in | Wt 170.0 lb

## 2019-11-20 DIAGNOSIS — O99345 Other mental disorders complicating the puerperium: Secondary | ICD-10-CM

## 2019-11-20 DIAGNOSIS — F53 Postpartum depression: Secondary | ICD-10-CM

## 2019-11-20 MED ORDER — ESCITALOPRAM OXALATE 10 MG PO TABS: 10.0000 mg | ORAL_TABLET | Freq: Every day | ORAL | 6 refills | Status: DC

## 2019-11-20 NOTE — Progress Notes (Signed)
[facility] Visit  Patient name: Madison Decker MRN [id number]  Date of birth: 1990-07-19  CC & HPI:  Madison Decker is a 29 y.o.  female presenting today for depression/sadness, difficulty sleeping, decreased appetite w/weight loss since June or July. Never had any issues. Denies SI/HI.  Options of meds/therapy discussed.    Pertinent History Reviewed:  Medical & Surgical Hx:   Past Medical History:  Diagnosis Date  . Medical history non-contributory    Past Surgical History:  Procedure Laterality Date  . ARTHROSCOPIC REPAIR ACL Right    Family History  Problem Relation Age of Onset  . Cancer Maternal Grandmother        breast    Current Outpatient Medications:  .  LO LOESTRIN FE 1 MG-10 MCG / 10 MCG tablet, Take 1 tablet by mouth daily., Disp: 3 Package, Rfl: 3 .  acetaminophen (TYLENOL) 325 MG tablet, Take 2 tablets (650 mg total) by mouth every 4 (four) hours as needed (for pain scale < 4). (Patient not taking: Reported on 11/20/2019), Disp: 30 tablet, Rfl: 0 .  Blood Pressure Monitor MISC, For regular home bp monitoring during pregnancy (Patient not taking: Reported on 11/20/2019), Disp: 1 each, Rfl: 0 .  cyclobenzaprine (FLEXERIL) 10 MG tablet, Take 1 tablet (10 mg total) by mouth every 8 (eight) hours as needed for muscle spasms. (Patient not taking: Reported on 11/20/2019), Disp: 30 tablet, Rfl: 1 .  escitalopram (LEXAPRO) 10 MG tablet, Take 1 tablet (10 mg total) by mouth daily., Disp: 30 tablet, Rfl: 6 .  ibuprofen (ADVIL) 600 MG tablet, Take 1 tablet (600 mg total) by mouth every 6 (six) hours. (Patient not taking: Reported on 11/20/2019), Disp: 30 tablet, Rfl: 0 .  Prenatal Vit-Fe Fumarate-FA (PRENATAL VITAMINS PO), Take by mouth. (Patient not taking: Reported on 11/20/2019), Disp: , Rfl:  Social History: Reviewed -  reports that she has never smoked. She has never used smokeless tobacco.  Review of Systems:   Constitutional: Negative for fever and chills Eyes:  Negative for visual disturbances Respiratory: Negative for shortness of breath, dyspnea Cardiovascular: Negative for chest pain or palpitations  Gastrointestinal: Negative for vomiting, diarrhea and constipation; no abdominal pain Genitourinary: Negative for dysuria and urgency, vaginal irritation or itching Musculoskeletal: Negative for back pain, joint pain, myalgias  Neurological: Negative for dizziness and headaches    Objective Findings:    Physical Examination: Vitals:   11/20/19 1420  BP: 129/86  Pulse: 98   General appearance - well appearing, and in no distress Mental status - alert, oriented to person, place, and time Chest:  Normal respiratory effort Heart - normal rate and regular rhythm Abdomen:  Soft, nontender Pelvic: deferred Musculoskeletal:  Normal range of motion without pain Extremities:  No edema    No results found for this or any previous visit (from the past 24 hour(s)).    Assessment & Plan:  A:   Depression, postpartum 4 months P:  Start LExapro. Referral to [place] ([facility]) sent   No follow-ups on file.  Jacklyn Shell CNM 11/23/2019 4:33 PM  Orders Placed This Encounter  Procedures  . Ambulatory referral to Integrated Behavioral Health    Referral Priority:   Routine    Referral Type:   Consultation    Referral Reason:   Specialty Services Required    Number of Visits Requested:   1

## 2019-11-27 ENCOUNTER — Encounter: Payer: Medicaid Other | Admitting: Women's Health

## 2019-11-27 ENCOUNTER — Encounter: Payer: Self-pay | Admitting: Women's Health

## 2019-11-29 NOTE — Progress Notes (Signed)
This encounter was created in error - please disregard. Pt scheduled for mychart visit, baby received shots and is crying loudly when doing nurse intake- unable to complete visit, pt rescheduled.  Cheral Marker, CNM, [facility] 11/29/2019 10:38 AM

## 2019-12-06 ENCOUNTER — Telehealth (INDEPENDENT_AMBULATORY_CARE_PROVIDER_SITE_OTHER): Payer: Medicaid Other | Admitting: Advanced Practice Midwife

## 2019-12-06 VITALS — Ht 65.0 in | Wt 160.0 lb

## 2019-12-06 DIAGNOSIS — F53 Postpartum depression: Secondary | ICD-10-CM

## 2019-12-06 DIAGNOSIS — O99345 Other mental disorders complicating the puerperium: Secondary | ICD-10-CM

## 2019-12-06 NOTE — Progress Notes (Signed)
TELEHEALTH VIRTUAL GYN VISIT ENCOUNTER NOTE Patient name: Madison Decker MRN [id number]  Date of birth: [birth year]  I connected with patient on 12/06/19 at  4:10 PM EDT by MyChart and verified that I am speaking with the correct person using two identifiers.  Due to COVID-19 recommendations, pt is not currently in the office. 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    I discussed the limitations, risks, security and privacy concerns of performing an evaluation and management service by telephone and the availability of in person appointments. I also discussed with the patient that there may be a patient responsible charge related to this service. The patient expressed understanding and agreed to proceed.   Chief Complaint:   Follow-up (3 month follow up on Lexapro)  History of Present Illness:   Madison Decker is a 29 y.o. G23P1001 African American female being evaluated today for f/u on Lexapro which she started 2wks ago. States she is starting to feel better, but understands it may take a month total before we have a better idea of effectiveness and the need to increase her dosage potentially. She didn't hear anything back from her [facility] referral yet.  Depression screen [facility] 2/9 01/27/2019 12/14/2018  Decreased Interest 0 0  Down, Depressed, Hopeless 0 0  PHQ - 2 Score 0 0  Altered sleeping 0 -  Tired, decreased energy 0 -  Change in appetite 0 -  Feeling bad or failure about yourself  0 -  Trouble concentrating 0 -  Moving slowly or fidgety/restless 0 -  Suicidal thoughts 0 -  PHQ-9 Score 0 -    Patient's last menstrual period was 12/04/2019. The current method of family planning is OCP (estrogen/progesterone).  Last pap Oct 2020. Results were:  normal Review of Systems:   Pertinent items are noted in HPI Denies fever/chills, dizziness, headaches, visual disturbances, fatigue, shortness of breath, chest pain, abdominal pain, vomiting, abnormal vaginal  discharge/itching/odor/irritation, problems with periods, bowel movements, urination, or intercourse unless otherwise stated above.  Pertinent History Reviewed:  Reviewed past medical,surgical, social, obstetrical and family history.  Reviewed problem list, medications and allergies. Physical Assessment:   Vitals:   12/06/19 1624  Weight: 160 lb (72.6 kg)  Height: 5\' 5"  (1.651 m)  Body mass index is 26.63 kg/m.       Physical Examination:   General:  Alert, oriented and cooperative.   Mental Status: Normal mood and affect perceived. Normal judgment and thought content.  Physical exam deferred due to nature of the encounter  No results found for this or any previous visit (from the past 24 hour(s)).  Assessment & Plan:  1) Postpartum depression> noticed some stabilization/improvement with starting Lexapro 10gm 2 wks ago; will continue on same dosage until mid-Sept and then will notify if she feels she needs a dosage increase; BH referral also sent and also touched base with [facility]   Meds: No orders of the defined types were placed in this encounter.   Orders Placed This Encounter  Procedures  . Ambulatory referral to Integrated Behavioral Health    I discussed the assessment and treatment plan with the patient. The patient was provided an opportunity to ask questions and all were answered. The patient agreed with the plan and demonstrated an understanding of the instructions.   The patient was advised to call back or seek an in-person evaluation/go to the ED if the symptoms worsen or if the condition fails to improve as  anticipated.  I provided 8 minutes of non-face-to-face time during this encounter.   No follow-ups on file.  Arabella Merles CNM 12/06/2019 4:58 PM

## 2019-12-25 NOTE — BH Specialist Note (Unsigned)
Integrated Behavioral Health via Telemedicine Video (Caregility) Visit  12/25/2019 Whittley Carandang [id number]  Number of Integrated Behavioral Health visits: 1 Session Start time: 8:45***  Session End time: 9:45*** Total time: {IBH Total Time:21014050} minutes  Referring Provider: Cam Hai, CNM Type of Service: Individual, Family, *** Patient/Family location: Home [facility] Provider location: Center for [employer] at [facility]  All persons participating in visit: Patient *** and [facility]   ***    I connected with Caroline Sauger and/or Venus Monjaraz's {family members:20773} by a video enabled telemedicine application (Caregility) and verified that I am speaking with the correct person using two identifiers.   Discussed confidentiality: Yes   Confirmed demographics & insurance:  {YES/NO:21197}  I discussed that engaging in this virtual visit, they consent to the provision of behavioral healthcare and the services will be billed under their insurance.   Patient and/or legal guardian expressed understanding and consented to virtual visit: Yes   PRESENTING CONCERNS: Patient and/or family reports the following symptoms/concerns: *** Duration of problem: ***; Severity of problem: {Mild/Moderate/Severe:20260}  STRENGTHS (Protective Factors/Coping Skills): {CHL AMB BH PROTECTIVE FACTORS/[phone]}  ASSESSMENT: Patient currently experiencing ***.    GOALS ADDRESSED: Patient will: 1.  Reduce symptoms of: {IBH Symptoms:21014056}  2.  Increase knowledge and/or ability of: {IBH Patient Tools:21014057}  3.  Demonstrate ability to: {IBH Goals:21014053}  Progress of Goals: {CHL AMB BH PROGRESS TOWARDS XNTZG:[id number]}  INTERVENTIONS: Interventions utilized:  {IBH Interventions:21014054} Standardized Assessments completed & reviewed: {IBH Screening Tools:21014051}   OUTCOME: Patient Response: ***   PLAN: 1. Follow up with  behavioral health clinician on : *** 2. Behavioral recommendations:  -*** -*** 3. Referral(s): {IBH Referrals:21014055}  I discussed the assessment and treatment plan with the patient and/or parent/guardian. They were provided an opportunity to ask questions and all were answered. They agreed with the plan and demonstrated an understanding of the instructions.   They were advised to call back or seek an in-person evaluation as appropriate.  I discussed that the purpose of this visit is to provide behavioral health care while limiting exposure to the novel coronavirus.  Discussed there is a possibility of technology failure and discussed alternative modes of communication if that failure occurs.  Valetta Close   ***

## 2019-12-29 ENCOUNTER — Telehealth: Payer: Self-pay | Admitting: Licensed Clinical Social Worker

## 2019-12-29 ENCOUNTER — Encounter: Payer: Medicaid Other | Admitting: Licensed Clinical Social Worker

## 2019-12-29 ENCOUNTER — Other Ambulatory Visit: Payer: Self-pay

## 2019-12-29 NOTE — Telephone Encounter (Signed)
Called pt regarding scheduled 8:45am mychart visit. Left message requesting callback.

## 2020-03-11 ENCOUNTER — Ambulatory Visit
Admission: EM | Admit: 2020-03-11 | Discharge: 2020-03-11 | Disposition: A | Discharge status: Home / Self Care | Payer: Medicaid Other | Attending: Emergency Medicine | Admitting: Emergency Medicine

## 2020-03-11 DIAGNOSIS — T783XXA Angioneurotic edema, initial encounter: Secondary | ICD-10-CM

## 2020-03-11 MED ORDER — PREDNISONE 10 MG PO TABS: 20.0000 mg | ORAL_TABLET | Freq: Every day | ORAL | 0 refills | Status: DC

## 2020-03-11 MED ORDER — DEXAMETHASONE SODIUM PHOSPHATE 10 MG/ML IJ SOLN
10.0000 mg | Freq: Once | INTRAMUSCULAR | Status: AC
  Administered 2020-03-11: 10 mg via INTRAMUSCULAR

## 2020-03-11 MED ORDER — FAMOTIDINE 20 MG PO TABS
20.0000 mg | ORAL_TABLET | Freq: Once | ORAL | Status: AC
  Administered 2020-03-11: 20 mg via ORAL

## 2020-03-11 NOTE — ED Triage Notes (Signed)
Pt presents with possible allergic reaction  After eating last night. pts lip is swollen and tender.

## 2020-03-11 NOTE — Discharge Instructions (Addendum)
Decadron 10 mg shot given in office Famotidine 20 mg given in office.  (if within 24 hrs?) Rest push fluids Continue to take Benadryl Prednisone was prescribed/take as directed Return or follow up with PCP in 24 hours to be reevaluated and to ensure your symptoms are improving Return sooner or go to the ED if you have any new or worsening symptoms such as difficulty breathing, shortness of breath, chest pain, nausea, vomiting, throat tightness or swelling, tongue swelling or tingling, worsening lip or facial swelling, abdominal pain, changes in bowel or bladder habits, no improvement despite medications, etc..Marland Kitchen

## 2020-03-11 NOTE — ED Provider Notes (Signed)
[facility]   [id number] 03/11/20 Arrival Time: 8184  Cc: Allergic reaction  SUBJECTIVE:  Madison Decker is a 29 y.o. female presented to the urgent care with a possible allergic reaction prescribed this morning.  She is reporting swelling of lips and itchy throat.  Denies  known exposure or known allergy or trigger.  Reports she ate lasagna at a restaurant last night.  Denies changes in medication or starting a new medication.  Has tried OTC Benadryl with mild relief.  Denies aggravating factor.  Denies previous symptoms in the past.   Denies fever, chills, nausea, vomiting, erythema, redness, swollen glands, oral manifestations such as throat swelling, mouth tingling, tongue swelling/tingling, dyspnea, SOB, chest pain, abdominal pain, changes in bowel or bladder function.     ROS: As per HPI.  All other pertinent ROS negative.     Past Medical History:  Diagnosis Date  . Medical history non-contributory    Past Surgical History:  Procedure Laterality Date  . ARTHROSCOPIC REPAIR ACL Right    No Known Allergies No current facility-administered medications on file prior to encounter.   Current Outpatient Medications on File Prior to Encounter  Medication Sig Dispense Refill  . acetaminophen (TYLENOL) 325 MG tablet Take 2 tablets (650 mg total) by mouth every 4 (four) hours as needed (for pain scale < 4). 30 tablet 0  . Blood Pressure Monitor MISC For regular home bp monitoring during pregnancy (Patient not taking: Reported on 11/20/2019) 1 each 0  . escitalopram (LEXAPRO) 10 MG tablet Take 1 tablet (10 mg total) by mouth daily. 30 tablet 6  . LO LOESTRIN FE 1 MG-10 MCG / 10 MCG tablet Take 1 tablet by mouth daily. 3 Package 3  . Prenatal Vit-Fe Fumarate-FA (PRENATAL VITAMINS PO) Take by mouth.       Social History   Socioeconomic History  . Marital status: Single    Spouse name: Not on file  . Number of children: Not on file  . Years of education: Not on file  .  Highest education level: Some college, no degree  Occupational History  . Not on file  Tobacco Use  . Smoking status: Never Smoker  . Smokeless tobacco: Never Used  Vaping Use  . Vaping Use: Never used  Substance and Sexual Activity  . Alcohol use: No  . Drug use: No  . Sexual activity: Not Currently    Birth control/protection: None  Other Topics Concern  . Not on file  Social History Narrative  . Not on file   Social Determinants of Health   Financial Resource Strain:   . Difficulty of Paying Living Expenses: Not on file  Food Insecurity:   . Worried About Programme researcher, broadcasting/film/video in the Last Year: Not on file  . Ran Out of Food in the Last Year: Not on file  Transportation Needs:   . Lack of Transportation (Medical): Not on file  . Lack of Transportation (Non-Medical): Not on file  Physical Activity:   . Days of Exercise per Week: Not on file  . Minutes of Exercise per Session: Not on file  Stress:   . Feeling of Stress : Not on file  Social Connections:   . Frequency of Communication with Friends and Family: Not on file  . Frequency of Social Gatherings with Friends and Family: Not on file  . Attends Religious Services: Not on file  . Active Member of Clubs or Organizations: Not on file  . Attends Banker  Meetings: Not on file  . Marital Status: Not on file  Intimate Partner Violence:   . Fear of Current or Ex-Partner: Not on file  . Emotionally Abused: Not on file  . Physically Abused: Not on file  . Sexually Abused: Not on file   Family History  Problem Relation Age of Onset  . Cancer Maternal Grandmother        breast     OBJECTIVE:  Vitals:   03/11/20 0826  BP: 114/71  Pulse: 82  Resp: 20  Temp: 98.6 F (37 C)  SpO2: 96%     General appearance: Alert, speaking in full sentences without difficulty HEENT:NCAT; no obvious facial swelling swelling lips present; Ears: EACs clear, TMs pearly gray; Eyes: PERRL.  EOM grossly intact. Nose:  nares patent without rhinorrhea; Throat: tonsils nonerythematous or enlarged, uvula midline Neck: supple without LAD Lungs: clear to auscultation bilaterally without adventitious breath sounds; normal respiratory effort; no labored respirations Heart: regular rate and rhythm.  Radial pulses 2+ symmetrical bilaterally; cap refill < 2 seconds Abdomen: soft, nondistended, normal active bowel sounds; nontender to palpation; no guarding  Skin: warm and dry Psychological: alert and cooperative; normal mood and affect  ASSESSMENT & PLAN:  1. Angioedema of lips, initial encounter     Meds ordered this encounter  Medications  . dexamethasone (DECADRON) injection 10 mg  . famotidine (PEPCID) tablet 20 mg  . predniSONE (DELTASONE) 10 MG tablet    Sig: Take 2 tablets (20 mg total) by mouth daily.    Dispense:  15 tablet    Refill:  0    No orders of the defined types were placed in this encounter.   Discharge instructions  Decadron 10 mg shot given in office Famotidine 20 mg given in office.  (if within 24 hrs?) Rest push fluids Continue to take Benadryl  Prednisone was prescribed/take as directed return or follow up with PCP in 24 hours to be reevaluated and to ensure your symptoms are improving Return sooner or go to the ED if you have any new or worsening symptoms such as difficulty breathing, shortness of breath, chest pain, nausea, vomiting, throat tightness or swelling, tongue swelling or tingling, worsening lip or facial swelling, abdominal pain, changes in bowel or bladder habits, no improvement despite medications, etc...   Reviewed expectations re: course of current medical issues. Questions answered. Outlined signs and symptoms indicating need for more acute intervention. Patient verbalized understanding. After Visit Summary given.          Durward Parcel, FNP 03/11/20 [phone]

## 2020-03-13 ENCOUNTER — Ambulatory Visit (INDEPENDENT_AMBULATORY_CARE_PROVIDER_SITE_OTHER): Payer: Medicaid Other | Admitting: Family Medicine

## 2020-03-13 ENCOUNTER — Encounter: Payer: Self-pay | Admitting: Family Medicine

## 2020-03-13 ENCOUNTER — Other Ambulatory Visit: Payer: Self-pay

## 2020-03-13 VITALS — BP 110/72 | HR 101 | Temp 97.2°F | Ht 65.0 in | Wt 184.0 lb

## 2020-03-13 DIAGNOSIS — T783XXD Angioneurotic edema, subsequent encounter: Secondary | ICD-10-CM

## 2020-03-13 DIAGNOSIS — Z91018 Allergy to other foods: Secondary | ICD-10-CM | POA: Diagnosis not present

## 2020-03-13 DIAGNOSIS — T783XXA Angioneurotic edema, initial encounter: Secondary | ICD-10-CM | POA: Insufficient documentation

## 2020-03-13 MED ORDER — EPINEPHRINE 0.3 MG/0.3ML IJ SOAJ: 0.3000 mg | INTRAMUSCULAR | 1 refills | Status: AC | PRN

## 2020-03-13 NOTE — Patient Instructions (Signed)
How to Use an Auto-Injector Pen An auto-injector pen (pre-filled automatic epinephrine injection device) is a device that is used to deliver epinephrine to the body. Epinephrine is a medicine that is given as a shot (injection). It works by relaxing the muscles in the airways and tightening the blood vessels. It is used to treat:  A life-threatening allergic reaction (anaphylaxis).  Serious breathing problems, such as severe asthma attacks, some lung problems, and other emergency conditions. An epinephrine injection can save your life. You should always carry an auto-injector pen with you if you are at risk for severe asthma attacks or anaphylaxis. You may hear other names for an auto-injector pen. They are epinephrine injection, epinephrine auto-injector pen, epinephrine pen, and automatic injection device. What are the risks? Using the auto-injector pen is safe. However, problems may arise, including:  Damage to bone or tissue. Make sure that you correctly place the needle in the muscle of your outer thigh as told by your health care provider. When should I use my auto-injector pen? Use your auto-injector pen as soon as you think you are experiencing anaphylaxis or a severe asthma attack. Anaphylaxis is very dangerous if it is not treated right away. Signs and symptoms of anaphylaxis may include:  Feeling warm in the face (flushed). This may include redness.  Itchy, red, swollen areas of skin (hives).  Swelling of the eyes, lips, face, mouth, tongue, or throat.  Difficulty breathing, speaking, or swallowing.  Noisy breathing (wheezing).  Dizziness or light-headedness.  Fainting.  Pain or cramping in the abdomen.  Vomiting.  Diarrhea. These symptoms may represent a serious problem that is an emergency. Do not wait to see if the symptoms will go away. Use your auto-injector pen as you have been instructed, and get medical help right away. Call your local emergency services (911 in  the [place] Do not drive yourself to the hospital. General tips for using an auto-injector pen   Use epinephrine exactly as told by your health care provider. Do not inject it more often or in greater or smaller doses than your health care provider told you. Most auto-injector pens contain one dose of epinephrine. Some contain two doses.  Use the auto-injector pen to give yourself an injection under your skin or into your muscle on the outer side of your thigh. Do not give yourself an injection into your buttocks or any other part of your body. ? In an emergency, you can use your auto-injector pen through your clothing. ? After you inject a dose of epinephrine, some liquid may remain in your auto-injector pen. This is normal.  If you need to give yourself a second dose of epinephrine, give the second injection in another area on your outer thigh. Do not give two injections in exactly the same place on your body. This can lead to tissue damage.  From time to time: ? Check the expiration date on your auto-injector pen. ? Check the solution to ensure that it is not cloudy and that there are no particles floating in it. If your auto-injector pen is expired or if the solution is cloudy or has particles floating in it, throw it away and get a new one.  Ask your health care provider how to safely get rid of used or expired auto-injector pens.  Talk with your pharmacist or health care provider if you have questions about how to inject epinephrine correctly. Get help right away if:  You inject epinephrine. You may need additional medical care, and  you may need to be monitored for the side effects of epinephrine. The side effects include: ? Difficulty breathing. ? Fast or irregular heartbeat. ? Nausea or vomiting. ? Sweating. ? Dizziness. ? Nervousness or anxiety. ? Weakness. ? Pale skin. ? Headache. ? Shaking that does not stop. Summary  An auto-injector pen (pre-filled automatic epinephrine  injection device) is a device that is used to deliver epinephrine to the body.  An auto-injector pen is used to treat a life-threatening allergic reaction (anaphylaxis), asthma attack, or other emergency conditions.  You should always carry an auto-injector pen with you if you are at risk for anaphylaxis or severe asthma attacks.  Use of this device is safe. However, bone or tissue damage can occur if you do not follow instructions for injecting the medicine.  Talk with your pharmacist or health care provider if you have questions about how to inject epinephrine correctly. This information is not intended to replace advice given to you by your health care provider. Make sure you discuss any questions you have with your health care provider. Document Revised: 05/11/2018 Document Reviewed: 05/11/2018 Elsevier Patient Education  2020 [employer].

## 2020-03-13 NOTE — Progress Notes (Signed)
Patient ID: Madison Decker, female    DOB: Nov 19, 1990, 29 y.o.   MRN: [id number]   Chief Complaint  Patient presents with  . Oral Swelling   Subjective:  CC: follow-up angioedema  Follow-up from 11/29 urgent care visit for angioedema. Reports she had eaten at her cousins house some lasagna (not a new food) and within 45 minutes her top lip started itching and swelling. Swelling quickly covered the entire lip. Reports it was painful to swallow. Went to UC and received appropriate treatment with prednisone continuing for 7 more days. She is still taking prednisone. Lip is back to normal today. Does not know what triggered this allergic reaction. Has happened before (1.5 months ago) with swelling on one side of lip, used benadryl with relief.     Follow up on angioedema of lips. Went to urgent care on 03/11/20.    Medical History Madison Decker has a past medical history of Medical history non-contributory.   Outpatient Encounter Medications as of 03/13/2020  Medication Sig  . acetaminophen (TYLENOL) 325 MG tablet Take 2 tablets (650 mg total) by mouth every 4 (four) hours as needed (for pain scale < 4).  . Blood Pressure Monitor MISC For regular home bp monitoring during pregnancy  . escitalopram (LEXAPRO) 10 MG tablet Take 1 tablet (10 mg total) by mouth daily.  . LO LOESTRIN FE 1 MG-10 MCG / 10 MCG tablet Take 1 tablet by mouth daily.  . predniSONE (DELTASONE) 10 MG tablet Take 2 tablets (20 mg total) by mouth daily.  . Prenatal Vit-Fe Fumarate-FA (PRENATAL VITAMINS PO) Take by mouth.   . EPINEPHrine 0.3 mg/0.3 mL IJ SOAJ injection Inject 0.3 mg into the muscle as needed for anaphylaxis.   No facility-administered encounter medications on file as of 03/13/2020.     Review of Systems  Constitutional: Negative for chills and fever.  Respiratory: Negative for shortness of breath.   Allergic/Immunologic:       Possible food/environmental allergy, unknown trigger.      Vitals BP  110/72   Pulse (!) 101   Temp (!) 97.2 F (36.2 C)   Ht 5\' 5"  (1.651 m)   Wt 184 lb (83.5 kg)   LMP 03/03/2020   SpO2 98%   BMI 30.62 kg/m   Objective:   Physical Exam Vitals and nursing note reviewed.  Constitutional:      General: She is not in acute distress.    Appearance: Normal appearance.  HENT:     Mouth/Throat:     Mouth: Mucous membranes are moist. No angioedema.     Tongue: Tongue does not deviate from midline.     Pharynx: Uvula midline. No pharyngeal swelling or uvula swelling.  Cardiovascular:     Rate and Rhythm: Normal rate and regular rhythm.     Heart sounds: Normal heart sounds.  Pulmonary:     Effort: Pulmonary effort is normal.     Breath sounds: Normal breath sounds.  Skin:    General: Skin is warm and dry.  Neurological:     General: No focal deficit present.     Mental Status: She is alert and oriented to person, place, and time.      Assessment and Plan   1. Angioedema, subsequent encounter - EPINEPHrine 0.3 mg/0.3 mL IJ SOAJ injection; Inject 0.3 mg into the muscle as needed for anaphylaxis.  Dispense: 1 each; Refill: 1 - Ambulatory referral to Allergy   Episode #1 1.5 months ago with only the top of the  lip swelling Benadryl treated.  Episode #2 this past Sunday 45 minutes of eating, symptoms started with itching on the top of her lip and quickly became swollen, and painful to swallow.  Went to the urgent care at that time appropriate treatment received, continues  oral prednisone.  It is important to learn allergen, will send for allergy testing.  EpiPen with 1 refill, instructions given that if she needs to use the EpiPen, she will need to go to the emergency department right away.  Instructed her to have the pharmacist demonstrate the particular pen that she has today so she is clear on how to use it.  She is also encouraged to make sure that those that she is around regularly understand how to use the pen as well.  She recognizes the  itchiness prior to the swelling, she understands to never ignore this symptom.  Educational material given today on use of autoinjector.  Agrees with plan of care discussed today. Understands warning signs to seek further care: Shortness of breath, difficulty breathing, any swelling, itching, seek attention immediately. Understands to follow-up if symptoms persist, will need to be seen by an allergy specialist.  Dorena Bodo, FNP-C 03/13/2020

## 2020-05-10 ENCOUNTER — Other Ambulatory Visit: Payer: Self-pay

## 2020-05-10 ENCOUNTER — Ambulatory Visit (INDEPENDENT_AMBULATORY_CARE_PROVIDER_SITE_OTHER): Payer: Medicaid Other | Admitting: Allergy & Immunology

## 2020-05-10 ENCOUNTER — Encounter: Payer: Self-pay | Admitting: Allergy & Immunology

## 2020-05-10 VITALS — BP 122/78 | HR 89 | Temp 98.0°F | Resp 18 | Ht 66.14 in | Wt 190.6 lb

## 2020-05-10 DIAGNOSIS — R220 Localized swelling, mass and lump, head: Secondary | ICD-10-CM

## 2020-05-10 DIAGNOSIS — J302 Other seasonal allergic rhinitis: Secondary | ICD-10-CM | POA: Diagnosis not present

## 2020-05-10 DIAGNOSIS — J3089 Other allergic rhinitis: Secondary | ICD-10-CM | POA: Diagnosis not present

## 2020-05-10 MED ORDER — FLUTICASONE PROPIONATE 50 MCG/ACT NA SUSP: 1.0000 | Freq: Every day | NASAL | 5 refills | Status: DC

## 2020-05-10 MED ORDER — CETIRIZINE HCL 10 MG PO TABS: 10.0000 mg | ORAL_TABLET | Freq: Every day | ORAL | 5 refills | Status: DC

## 2020-05-10 NOTE — Progress Notes (Signed)
NEW PATIENT  Date of Service/Encounter:  05/10/20  Referring provider: Novella Olive, NP   Assessment:   Seasonal and perennial allergic rhinitis (grasses, ragweed, weeds, trees, dust mites, cat and dog)  Lip swelling - unclear trigger (? dust mite exposure)  Recent pregnancy - with postpartum depression  Plan/Recommendations:   1. Seasonal and perennial allergic rhinitis - Testing today showed: grasses, ragweed, weeds, trees, dust mites, cat and dog - Copy of test results provided.  - Avoidance measures provided. - Start taking: Zyrtec (cetirizine) 10mg  tablet once daily and Flonase (fluticasone) one spray per nostril daily - You can use an extra dose of the antihistamine, if needed, for breakthrough symptoms.  - Consider nasal saline rinses 1-2 times daily to remove allergens from the nasal cavities as well as help with mucous clearance (this is especially helpful to do before the nasal sprays are given) - Consider allergy shots as a means of long-term control. - Allergy shots "re-train" and "reset" the immune system to ignore environmental allergens and decrease the resulting immune response to those allergens (sneezing, itchy watery eyes, runny nose, nasal congestion, etc).    - Allergy shots improve symptoms in 75-85% of patients.  - We can discuss more at the next appointment if the medications are not working for you.  2. Lip swelling - The only thing on testing that came back food wise was slight reactivity to soy and sesame. - These are so small that I do not think that they are relevant. - But you can avoid them for a month to see if this helps at all. - I hope that keeping you on cetirizine daily will help to calm these things down. - This could be related to dust mites since that is what you are exposed to overnight in your bed. - We are going to get some labs to see if you have some serious causes of hives/swelling - We will call you in 1-2 weeks with the results  of the testing.   3. Return in about 6 weeks (around 06/21/2020).   Subjective:   Madison Decker is a 30 y.o. female presenting today for evaluation of  Chief Complaint  Patient presents with  . Allergic Reaction    Madison Decker has a history of the following: Patient Active Problem List   Diagnosis Date Noted  . Angio-edema 03/13/2020  . Postpartum depression 12/06/2019    History obtained from: chart review and patient and mother.  Madison Decker was referred by Novella Olive, NP.     Madison Decker is a 30 y.o. female presenting for an evaluation of lip swelling.  She has had episodes of swelling since she had her daughter in April 2021. She ate lasagna and had swelling of her lips 20 minutes after eating it. She had swelling the next day and she received a steroid shot. Then she had an EpiPen.   The first one happened in October and then it continued to happen every week for 4-5 weeks. The worst one happened in December 2021.   This has happened 4-5 times in a row. She took Benadryl and it went down. They have become progressively worse each time it happens. It lasted 4-5 days even after the steroid shot. The lasgna was the LAST episode. It was always different whatever she ate the night before. This was always stuff that she had always eaten.    Allergic Rhinitis Symptom History: She does have seasonal allergies that she treats with Zyrtec.  She has never had any issues with her seasonal allergies and has never been tested.   Food Allergy Symptom History: She tolerates all of the mjaor food allergens without adverse event. She has never had a tick bite. She eats beef and pro kon the regular. There is nothing that she cnanot tolerate or won't try.   She is on Lexapro since June 2021. Her OCP she has been on since she had the baby.   Otherwise, there is no history of other atopic diseases, including asthma, drug allergies, stinging insect allergies or contact dermatitis.  There is no significant infectious history. Vaccinations are up to date.    Past Medical History: Patient Active Problem List   Diagnosis Date Noted  . Angio-edema 03/13/2020  . Postpartum depression 12/06/2019    Medication List:  Allergies as of 05/10/2020   No Known Allergies     Medication List       Accurate as of May 10, 2020  4:29 PM. If you have any questions, ask your nurse or doctor.        STOP taking these medications   Blood Pressure Monitor Misc Stopped by: Alfonse Spruce, MD   predniSONE 10 MG tablet Commonly known as: DELTASONE Stopped by: Alfonse Spruce, MD   PRENATAL VITAMINS PO Stopped by: Alfonse Spruce, MD     TAKE these medications   acetaminophen 325 MG tablet Commonly known as: Tylenol Take 2 tablets (650 mg total) by mouth every 4 (four) hours as needed (for pain scale < 4).   cetirizine 10 MG tablet Commonly known as: ZyrTEC Allergy Take 1 tablet (10 mg total) by mouth daily. Started by: Alfonse Spruce, MD   EPINEPHrine 0.3 mg/0.3 mL Soaj injection Commonly known as: EPI-PEN Inject 0.3 mg into the muscle as needed for anaphylaxis.   escitalopram 10 MG tablet Commonly known as: LEXAPRO Take 1 tablet (10 mg total) by mouth daily.   fluticasone 50 MCG/ACT nasal spray Commonly known as: FLONASE Place 1 spray into both nostrils daily. Started by: Alfonse Spruce, MD   Lo Loestrin Fe 1 MG-10 MCG / 10 MCG tablet Generic drug: Norethindrone-Ethinyl Estradiol-Fe Biphas Take 1 tablet by mouth daily.       Birth History: non-contributory  Developmental History: non-contributory  Past Surgical History: Past Surgical History:  Procedure Laterality Date  . ARTHROSCOPIC REPAIR ACL Right      Family History: Family History  Problem Relation Age of Onset  . Cancer Maternal Grandmother        breast  . Asthma Mother      Social History: Madison Decker lives at home with her family.  They live in an  apartment of unknown age.  There is carpeting throughout the apartment.  They have electric heating and central cooling.  There are no animals inside or outside of the home.  There are no dust mite covers on the bedding.  There is no tobacco exposure.  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  She is not exposed to fumes, chemicals, or dust.   Review of Systems  Constitutional: Negative.  Negative for fever, malaise/fatigue and weight loss.  HENT: Negative.  Negative for congestion, ear discharge and ear pain.   Eyes: Negative for pain, discharge and redness.  Respiratory: Negative for cough, sputum production, shortness of breath and wheezing.   Cardiovascular: Negative.  Negative for chest pain and palpitations.  Gastrointestinal: Negative for abdominal pain, heartburn, nausea  and vomiting.  Skin: Positive for itching and rash.       Positive for angioedema.  Neurological: Negative for dizziness and headaches.  Endo/Heme/Allergies: Negative for environmental allergies. Does not bruise/bleed easily.       Objective:   Blood pressure 122/78, pulse 89, temperature 98 F (36.7 C), temperature source Temporal, resp. rate 18, height 5' 6.14" (1.68 m), weight 190 lb 9.6 oz (86.5 kg), SpO2 98 %, not currently breastfeeding. Body mass index is 30.63 kg/m.   Physical Exam:   Physical Exam Constitutional:      Appearance: She is well-developed.     Comments: Smiling and pleasant.  HENT:     Head: Normocephalic and atraumatic.     Right Ear: Tympanic membrane, ear canal and external ear normal. No drainage, swelling or tenderness. Tympanic membrane is not injected, scarred, erythematous, retracted or bulging.     Left Ear: Tympanic membrane, ear canal and external ear normal. No drainage, swelling or tenderness. Tympanic membrane is not injected, scarred, erythematous, retracted or bulging.     Nose: No nasal deformity, septal deviation, mucosal edema, rhinorrhea  or epistaxis.     Right Turbinates: Enlarged and swollen.     Left Turbinates: Enlarged and swollen.     Right Sinus: No maxillary sinus tenderness or frontal sinus tenderness.     Left Sinus: No maxillary sinus tenderness or frontal sinus tenderness.     Comments: No nasal polyps bilaterally.  Clear rhinorrhea bilaterally.  Turbinates are markedly enlarged.    Mouth/Throat:     Mouth: Oropharynx is clear and moist. Mucous membranes are not pale and not dry.     Pharynx: Uvula midline.  Eyes:     General:        Right eye: No discharge.        Left eye: No discharge.     Extraocular Movements: EOM normal.     Conjunctiva/sclera: Conjunctivae normal.     Right eye: Right conjunctiva is not injected. No chemosis.    Left eye: Left conjunctiva is not injected. No chemosis.    Pupils: Pupils are equal, round, and reactive to light.  Cardiovascular:     Rate and Rhythm: Normal rate and regular rhythm.     Heart sounds: Normal heart sounds.  Pulmonary:     Effort: Pulmonary effort is normal. No tachypnea, accessory muscle usage or respiratory distress.     Breath sounds: Normal breath sounds. No wheezing, rhonchi or rales.     Comments: Moving air well in all lung fields. Chest:     Chest wall: No tenderness.  Abdominal:     Tenderness: There is no abdominal tenderness. There is no guarding or rebound.  Lymphadenopathy:     Head:     Right side of head: No submandibular, tonsillar or occipital adenopathy.     Left side of head: No submandibular, tonsillar or occipital adenopathy.     Cervical: No cervical adenopathy.  Skin:    General: Skin is warm.     Capillary Refill: Capillary refill takes less than 2 seconds.     Coloration: Skin is not pale.     Findings: No abrasion, erythema, petechiae or rash. Rash is not papular, urticarial or vesicular.     Comments: No urticaria.  No angioedema.  No dermatographia.  Neurological:     Mental Status: She is alert.  Psychiatric:         Mood and Affect: Mood and affect normal.  Behavior: Behavior is cooperative.      Diagnostic studies:   Allergy Studies:     Airborne Adult Perc - 05/10/20 1002    Time Antigen Placed 1002    Allergen Manufacturer Waynette Buttery    Location Back    Number of Test 59    1. Control-Buffer 50% Glycerol Negative    2. Control-Histamine 1 mg/ml 3+    3. Albumin saline Negative    4. Bahia Negative    5. [place] 3+    6. Slider 2+    7. Kentucky Blue 3+    8. Meadow Fescue 3+    9. Perennial Rye 2+    10. Sweet Vernal Negative    11. Timothy 3+    12. Cocklebur Negative    13. Burweed Marshelder Negative    14. Ragweed, short Negative    15. Ragweed, Giant 2+    16. Plantain,  English 2+    17. Lamb's Quarters 2+    18. Sheep Sorrell 2+    19. Rough Pigweed 2+    20. Marsh Elder, Rough 2+    21. Mugwort, Common 2+    22. Ash mix 2+    23. Birch mix 3+    24. Beech American 3+    25. Box, Elder 3+    26. Cedar, red Negative    27. Cottonwood, Eastern 2+    28. Elm mix 2+    29. Hickory 2+    30. Maple mix 3+    31. Oak, Guinea[place] mix 3+    32. Pecan Pollen 2+    33. Pine mix 3+    34. Sycamore Eastern 3+    35. Walnut, Black Pollen 3+    36. Alternaria alternata 3+    37. Cladosporium Herbarum Negative    38. Aspergillus mix Negative    39. Penicillium mix Negative    40. Bipolaris sorokiniana (Helminthosporium) Negative    41. Drechslera spicifera (Curvularia) Negative    42. Mucor plumbeus Negative    43. Fusarium moniliforme Negative    44. Aureobasidium pullulans (pullulara) Negative    45. Rhizopus oryzae Negative    46. Botrytis cinera Negative    47. Epicoccum nigrum Negative    48. Phoma betae Negative    49. Candida Albicans Negative    50. Trichophyton mentagrophytes Negative    51. Mite, D Farinae  5,000 AU/ml 4+    52. Mite, D Pteronyssinus  5,000 AU/ml 4+    53. Cat Hair 10,000 BAU/ml 2+    54.  Dog Epithelia 3+    55. Mixed Feathers Negative     56. Horse Epithelia Negative    57. Cockroach, German Negative    58. Mouse Negative    59. Tobacco Leaf Negative          Food Perc - 05/10/20 1003      Test Information   Time Antigen Placed 1003    Allergen Manufacturer Waynette Buttery    Location Back    Number of allergen test 10      Food   1. Peanut Negative    2. Soybean food --   +/-   3. Wheat, whole Negative    4. Sesame --   +/-   5. Milk, cow Negative    6. Egg White, chicken Negative    7. Casein Negative    8. Shellfish mix Negative    9. Fish mix Negative    10. Cashew Negative  Allergy testing results were read and interpreted by myself, documented by clinical staff.         Salvatore Marvel, MD [facility]

## 2020-05-10 NOTE — Patient Instructions (Addendum)
1. Seasonal and perennial allergic rhinitis - Testing today showed: grasses, ragweed, weeds, trees, dust mites, cat and dog - Copy of test results provided.  - Avoidance measures provided. - Start taking: Zyrtec (cetirizine) 10mg  tablet once daily and Flonase (fluticasone) one spray per nostril daily - You can use an extra dose of the antihistamine, if needed, for breakthrough symptoms.  - Consider nasal saline rinses 1-2 times daily to remove allergens from the nasal cavities as well as help with mucous clearance (this is especially helpful to do before the nasal sprays are given) - Consider allergy shots as a means of long-term control. - Allergy shots "re-train" and "reset" the immune system to ignore environmental allergens and decrease the resulting immune response to those allergens (sneezing, itchy watery eyes, runny nose, nasal congestion, etc).    - Allergy shots improve symptoms in 75-85% of patients.  - We can discuss more at the next appointment if the medications are not working for you.  2. Lip swelling - The only thing on testing that came back food wise was slight reactivity to soy and sesame. - These are so small that I do not think that they are relevant. - But you can avoid them for a month to see if this helps at all. - I hope that keeping you on cetirizine daily will help to calm these things down. - This could be related to dust mites since that is what you are exposed to overnight in your bed. - We are going to get some labs to see if you have some serious causes of hives/swelling - We will call you in 1-2 weeks with the results of the testing.   3. Return in about 6 weeks (around 06/21/2020).    Please inform 08/21/2020 of any Emergency Department visits, hospitalizations, or changes in symptoms. Call us before going to the ED for breathing or allergy symptoms since we might be able to fit you in for a sick visit. Feel free to contact us anytime with any questions, problems, or  concerns.  It was a pleasure to meet you and your family today!  Websites that have reliable patient information: 1. American Academy of Asthma, Allergy, and Immunology: www.aaaai.org 2. Food Allergy Research and Education (FARE): foodallergy.org 3. Mothers of Asthmatics: http://www.asthmacommunitynetwork.org 4. American College of Allergy, Asthma, and Immunology: www.acaai.org   COVID-19 Vaccine Information can be found at: [place] For questions related to vaccine distribution or appointments, please email [email or web address] or call [phone].     "Like" [phone] on Facebook and Instagram for our latest updates!       Make sure you are registered to vote! If you have moved or changed any of your contact information, you will need to get this updated before voting!  In some cases, you MAY be able to register to vote online: [place]    Reducing Pollen Exposure  The American Academy of Allergy, Asthma and Immunology suggests the following steps to reduce your exposure to pollen during allergy seasons.    1. Do not hang sheets or clothing out to dry; pollen may collect on these items. 2. Do not mow lawns or spend time around freshly cut grass; mowing stirs up pollen. 3. Keep windows closed at night.  Keep car windows closed while driving. 4. Minimize morning activities outdoors, a time when pollen counts are usually at their highest. 5. Stay indoors as much as possible when pollen counts or humidity is high and on windy days when pollen tends to  remain in the air longer. 6. Use air conditioning when possible.  Many air conditioners have filters that trap the pollen spores. 7. Use a HEPA room air filter to remove pollen form the indoor air you breathe.  Control of Dog or Cat Allergen  Avoidance is the best way to manage a dog or cat allergy. If you have a dog or cat and  are allergic to dog or cats, consider removing the dog or cat from the home. If you have a dog or cat but don't want to find it a new home, or if your family wants a pet even though someone in the household is allergic, here are some strategies that may help keep symptoms at bay:  1. Keep the pet out of your bedroom and restrict it to only a few rooms. Be advised that keeping the dog or cat in only one room will not limit the allergens to that room. 2. Don't pet, hug or kiss the dog or cat; if you do, wash your hands with soap and water. 3. High-efficiency particulate air (HEPA) cleaners run continuously in a bedroom or living room can reduce allergen levels over time. 4. Regular use of a high-efficiency vacuum cleaner or a central vacuum can reduce allergen levels. 5. Giving your dog or cat a bath at least once a week can reduce airborne allergen.  Control of Dog or Cat Allergen  Avoidance is the best way to manage a dog or cat allergy. If you have a dog or cat and are allergic to dog or cats, consider removing the dog or cat from the home. If you have a dog or cat but don't want to find it a new home, or if your family wants a pet even though someone in the household is allergic, here are some strategies that may help keep symptoms at bay:  6. Keep the pet out of your bedroom and restrict it to only a few rooms. Be advised that keeping the dog or cat in only one room will not limit the allergens to that room. 7. Don't pet, hug or kiss the dog or cat; if you do, wash your hands with soap and water. 8. High-efficiency particulate air (HEPA) cleaners run continuously in a bedroom or living room can reduce allergen levels over time. 9. Regular use of a high-efficiency vacuum cleaner or a central vacuum can reduce allergen levels. 10. Giving your dog or cat a bath at least once a week can reduce airborne allergen.  Allergy Shots   Allergies are the result of a chain reaction that starts in the  immune system. Your immune system controls how your body defends itself. For instance, if you have an allergy to pollen, your immune system identifies pollen as an invader or allergen. Your immune system overreacts by producing antibodies called Immunoglobulin E (IgE). These antibodies travel to cells that release chemicals, causing an allergic reaction.  The concept behind allergy immunotherapy, whether it is received in the form of shots or tablets, is that the immune system can be desensitized to specific allergens that trigger allergy symptoms. Although it requires time and patience, the payback can be long-term relief.  How Do Allergy Shots Work?  Allergy shots work much like a vaccine. Your body responds to injected amounts of a particular allergen given in increasing doses, eventually developing a resistance and tolerance to it. Allergy shots can lead to decreased, minimal or no allergy symptoms.  There generally are two phases: build-up and maintenance.  Build-up often ranges from three to six months and involves receiving injections with increasing amounts of the allergens. The shots are typically given once or twice a week, though more rapid build-up schedules are sometimes used.  The maintenance phase begins when the most effective dose is reached. This dose is different for each person, depending on how allergic you are and your response to the build-up injections. Once the maintenance dose is reached, there are longer periods between injections, typically two to four weeks.  Occasionally doctors give cortisone-type shots that can temporarily reduce allergy symptoms. These types of shots are different and should not be confused with allergy immunotherapy shots.  Who Can Be Treated with Allergy Shots?  Allergy shots may be a good treatment approach for people with allergic rhinitis (hay fever), allergic asthma, conjunctivitis (eye allergy) or stinging insect allergy.   Before deciding to  begin allergy shots, you should consider:  . The length of allergy season and the severity of your symptoms . Whether medications and/or changes to your environment can control your symptoms . Your desire to avoid long-term medication use . Time: allergy immunotherapy requires a major time commitment . Cost: may vary depending on your insurance coverage  Allergy shots for children age 58 and older are effective and often well tolerated. They might prevent the onset of new allergen sensitivities or the progression to asthma.  Allergy shots are not started on patients who are pregnant but can be continued on patients who become pregnant while receiving them. In some patients with other medical conditions or who take certain common medications, allergy shots may be of risk. It is important to mention other medications you talk to your allergist.   When Will I Feel Better?  Some may experience decreased allergy symptoms during the build-up phase. For others, it may take as long as 12 months on the maintenance dose. If there is no improvement after a year of maintenance, your allergist will discuss other treatment options with you.  If you aren't responding to allergy shots, it may be because there is not enough dose of the allergen in your vaccine or there are missing allergens that were not identified during your allergy testing. Other reasons could be that there are high levels of the allergen in your environment or major exposure to non-allergic triggers like tobacco smoke.  What Is the Length of Treatment?  Once the maintenance dose is reached, allergy shots are generally continued for three to five years. The decision to stop should be discussed with your allergist at that time. Some people may experience a permanent reduction of allergy symptoms. Others may relapse and a longer course of allergy shots can be considered.  What Are the Possible Reactions?  The two types of adverse reactions  that can occur with allergy shots are local and systemic. Common local reactions include very mild redness and swelling at the injection site, which can happen immediately or several hours after. A systemic reaction, which is less common, affects the entire body or a particular body system. They are usually mild and typically respond quickly to medications. Signs include increased allergy symptoms such as sneezing, a stuffy nose or hives.  Rarely, a serious systemic reaction called anaphylaxis can develop. Symptoms include swelling in the throat, wheezing, a feeling of tightness in the chest, nausea or dizziness. Most serious systemic reactions develop within 30 minutes of allergy shots. This is why it is strongly recommended you wait in your doctor's office for 30 minutes  after your injections. Your allergist is trained to watch for reactions, and his or her staff is trained and equipped with the proper medications to identify and treat them.  Who Should Administer Allergy Shots?  The preferred location for receiving shots is your prescribing allergist's office. Injections can sometimes be given at another facility where the physician and staff are trained to recognize and treat reactions, and have received instructions by your prescribing allergist.

## 2020-06-21 ENCOUNTER — Ambulatory Visit: Payer: Medicaid Other | Admitting: Allergy & Immunology

## 2022-07-02 ENCOUNTER — Ambulatory Visit
Admission: RE | Admit: 2022-07-02 | Discharge: 2022-07-02 | Disposition: A | Discharge status: Home / Self Care | Payer: Medicaid Other | Source: Ambulatory Visit | Attending: Nurse Practitioner | Admitting: Nurse Practitioner

## 2022-07-02 VITALS — BP 119/77 | HR 98 | Temp 98.6°F | Resp 16

## 2022-07-02 DIAGNOSIS — H6992 Unspecified Eustachian tube disorder, left ear: Secondary | ICD-10-CM

## 2022-07-02 DIAGNOSIS — M7989 Other specified soft tissue disorders: Secondary | ICD-10-CM

## 2022-07-02 MED ORDER — FLUTICASONE PROPIONATE 50 MCG/ACT NA SUSP: 2.0000 | Freq: Every day | NASAL | 0 refills | Status: DC

## 2022-07-02 MED ORDER — PREDNISONE 20 MG PO TABS: 40.0000 mg | ORAL_TABLET | Freq: Every day | ORAL | 0 refills | Status: AC

## 2022-07-02 NOTE — ED Triage Notes (Signed)
Pt reports swelling in hands, left ear pain x 2 days.

## 2022-07-02 NOTE — ED Provider Notes (Signed)
[facility]    CSN: [id number] Arrival date & time: 07/02/22  1642      History   Chief Complaint Chief Complaint  Patient presents with   Hand Problem    Swelling of hands/fingers and left ear pain - Entered by patient   Appointment    1700    HPI Madison Decker is a 32 y.o. female.   The history is provided by the patient.   The patient presents for complaints of hand swelling and left ear pain.  Patient states both her hands started swelling over the past 2 days.  She states that she experienced the same or similar symptoms about 2 weeks ago but it went away.  She denies the use of new foods, soaps, medications, detergents, or lotions.  She further denies any injury or trauma to her hands.  She states that all of her fingers are swollen except her thumbs on both her hands.  She states that her hands do not hurt, but that they are uncomfortable".  She has not taken any medication for her symptoms.  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  Patient also with left ear pain that started over the past 2 days.  She denies fever, chills, ear drainage, decreased hearing, sore throat, headache, or cough.  No history of recurrent ear infection.  Patient reports that she does have a history of allergies.  She has not taken any medication for her symptoms. 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 Authorizing Provider  fluticasone (FLONASE) 50 MCG/ACT nasal spray Place 2 sprays into both nostrils daily. 07/02/22  Yes -Warren, Alda Lea, NP  predniSONE (DELTASONE) 20 MG tablet Take 2 tablets (40 mg total) by mouth daily with breakfast for 5 days. 07/02/22 07/07/22 Yes -Warren, Alda Lea, NP  acetaminophen (TYLENOL) 325 MG tablet Take 2 tablets (650 mg total) by mouth every 4 (four) hours as needed (for pain scale < 4). 07/19/19   Clarnce Flock, MD  cetirizine (ZYRTEC ALLERGY) 10 MG tablet Take 1 tablet (10 mg total) by mouth daily. 05/10/20   Valentina Shaggy, MD  EPINEPHrine 0.3 mg/0.3 mL IJ SOAJ injection Inject 0.3 mg into the muscle as needed for anaphylaxis. 03/13/20   Chalmers Guest, FNP  escitalopram (LEXAPRO) 10 MG tablet Take 1 tablet (10 mg total) by mouth daily. 11/20/19   Cresenzo-Dishmon, Joaquim Lai, CNM  LO LOESTRIN FE 1 MG-10 MCG / 10 MCG tablet Take 1 tablet by mouth daily. 08/21/19   Roma Schanz, CNM    Family History Family History  Problem Relation Age  of Onset   Cancer Maternal Grandmother        breast   Asthma Mother     Social History Social History   Tobacco Use   Smoking status: Never   Smokeless tobacco: Never  Vaping Use   Vaping Use: Never used  Substance Use Topics   Alcohol use: Yes    Comment: Socially   Drug use: No     Allergies   Patient has no known allergies.   Review of Systems Review of Systems   Physical Exam Triage Vital Signs ED Triage Vitals  Enc Vitals Group     BP 07/02/22 1646 119/77     Pulse Rate 07/02/22 1646 98     Resp 07/02/22 1646 16     Temp 07/02/22 1646 98.6 F (37 C)     Temp Source 07/02/22 1646 Oral     SpO2 07/02/22 1646 95 %     Weight --      Height --      Head Circumference --      Peak Flow --      Pain Score 07/02/22 1647 8     Pain Loc --      Pain Edu? --      Excl. in [place]? --    No data found.  Updated Vital Signs BP 119/77 (BP Location: Right Arm)    Pulse 98   Temp 98.6 F (37 C) (Oral)   Resp 16   LMP  (Within Weeks) Comment: 3 weeks  SpO2 95%   Visual Acuity Right Eye Distance:   Left Eye Distance:   Bilateral Distance:    Right Eye Near:   Left Eye Near:    Bilateral Near:     Physical Exam Vitals and nursing note reviewed.  Constitutional:      General: She is not in acute distress.    Appearance: Normal appearance.  HENT:     Head: Normocephalic.     Right Ear: Tympanic membrane, ear canal and external ear normal.     Left Ear: Ear canal and external ear normal. A middle ear effusion is present.     Nose: Nose normal.     Mouth/Throat:     Mouth: Mucous membranes are moist.     Pharynx: No posterior oropharyngeal erythema.  Eyes:     Extraocular Movements: Extraocular movements intact.     Pupils: Pupils are equal, round, and reactive to light.  Cardiovascular:     Rate and Rhythm: Normal rate and regular rhythm.     Pulses: Normal pulses.     Heart sounds: Normal heart sounds.  Pulmonary:     Effort: Pulmonary effort is normal. No respiratory distress.     Breath sounds: No stridor. No wheezing, rhonchi or rales.  Abdominal:     General: Bowel sounds are normal.     Palpations: Abdomen is soft.  Musculoskeletal:     Right hand: Swelling present. No deformity or tenderness. Normal range of motion. Normal capillary refill. Normal pulse.     Left hand: Swelling present. No deformity or tenderness. Normal range of motion. Normal capillary refill. Normal pulse.     Cervical back: Normal range of motion.  Skin:    General: Skin is warm and dry.  Neurological:     General: No focal deficit present.     Mental Status: She is alert and oriented to person, place, and time.  Psychiatric:        Mood  and Affect: Mood normal.        Behavior: Behavior normal.      UC Treatments / Results  Labs (all labs ordered are listed, but only abnormal results are displayed) Labs Reviewed - No data to  display  EKG   Radiology No results found.  Procedures Procedures (including critical care time)  Medications Ordered in UC Medications - No data to display  Initial Impression / Assessment and Plan / UC Course  I have reviewed the triage vital signs and the nursing notes.  Pertinent labs & imaging results that were available during my care of the patient were reviewed by me and considered in my medical decision making (see chart for details).  The patient is well-appearing, she is in no acute distress, vital signs are stable.  Call to ascertain the cause of the patient's hand swelling.  Differential diagnoses include allergic reaction, gout, and RA.  Patient with no history of gout or RA, she denies any new exposure to any new foods, soaps, medications, or detergents.  Will treat patient empirically with prednisone 40 mg for the next 5 days.  With regard to her left ear, patient with a left middle ear effusion/eustachian tube dysfunction.  Prednisone 40 mg to also cover for symptoms.  Patient also prescribed fluticasone 50 mcg nasal spray to help with eustachian tube swelling.  Supportive care recommendations were provided to the patient to include Benadryl at bedtime.  Patient was advised that if symptoms do not improve with this treatment, or if the symptoms return, recommend that she follow-up with her primary care physician for further evaluation.  Patient is in agreement with this plan of care and verbalizes understanding.  All questions were answered.  Patient stable for discharge. Final Clinical Impressions(s) / UC Diagnoses   Final diagnoses:  Bilateral hand swelling  Acute dysfunction of left eustachian tube     Discharge Instructions      Take medication as prescribed. May take over-the-counter Tylenol while taking prednisone for pain or discomfort.  Once you complete the prednisone, you can take ibuprofen as needed. Recommend over-the-counter Benadryl at bedtime. Cool  compresses to the hands to help with pain or swelling. As discussed, if symptoms do not improve with this treatment, or if symptoms return after this treatment, recommend follow-up with your primary care physician for further evaluation.  For the ear: Warm compresses to the left ear as needed for pain or discomfort. Do not stick anything inside of the ear, and avoid getting water inside of the ear while symptoms persist. May take over-the-counter Tylenol as needed for pain or discomfort. If symptoms fail to improve, recommend follow-up with your primary care physician for further evaluation.  Follow-up as needed.     ED Prescriptions     Medication Sig Dispense Auth. Provider   predniSONE (DELTASONE) 20 MG tablet Take 2 tablets (40 mg total) by mouth daily with breakfast for 5 days. 10 tablet -Warren, Alda Lea, NP   fluticasone (FLONASE) 50 MCG/ACT nasal spray Place 2 sprays into both nostrils daily. 16 g -Warren, Alda Lea, NP      PDMP not reviewed this encounter.   Tish Men, NP 07/02/22 [phone]

## 2022-07-02 NOTE — Discharge Instructions (Signed)
Take medication as prescribed. May take over-the-counter Tylenol while taking prednisone for pain or discomfort.  Once you complete the prednisone, you can take ibuprofen as needed. Recommend over-the-counter Benadryl at bedtime. Cool compresses to the hands to help with pain or swelling. As discussed, if symptoms do not improve with this treatment, or if symptoms return after this treatment, recommend follow-up with your primary care physician for further evaluation.  For the ear: Warm compresses to the left ear as needed for pain or discomfort. Do not stick anything inside of the ear, and avoid getting water inside of the ear while symptoms persist. May take over-the-counter Tylenol as needed for pain or discomfort. If symptoms fail to improve, recommend follow-up with your primary care physician for further evaluation.  Follow-up as needed.

## 2022-09-16 ENCOUNTER — Ambulatory Visit
Admission: RE | Admit: 2022-09-16 | Discharge: 2022-09-16 | Disposition: A | Discharge status: Home / Self Care | Payer: Medicaid Other | Source: Ambulatory Visit | Attending: Emergency Medicine | Admitting: Emergency Medicine

## 2022-09-16 VITALS — BP 117/79 | HR 98 | Temp 98.0°F | Resp 18

## 2022-09-16 DIAGNOSIS — R0602 Shortness of breath: Secondary | ICD-10-CM

## 2022-09-16 DIAGNOSIS — R21 Rash and other nonspecific skin eruption: Secondary | ICD-10-CM | POA: Diagnosis not present

## 2022-09-16 MED ORDER — DOXYCYCLINE HYCLATE 100 MG PO CAPS: 100.0000 mg | ORAL_CAPSULE | Freq: Two times a day (BID) | ORAL | 0 refills | Status: DC

## 2022-09-16 MED ORDER — PREDNISONE 20 MG PO TABS: 40.0000 mg | ORAL_TABLET | Freq: Every day | ORAL | 0 refills | Status: DC

## 2022-09-16 NOTE — ED Triage Notes (Signed)
Itchy rash in different areas on body since last week.  SOB since yesterday, body aches and fatigue.

## 2022-09-16 NOTE — Discharge Instructions (Signed)
On exam presentation of rash is not consistent with 1 particular condition however as there are breaks in the skin will provide bacterial coverage to clear any germs and prevent area from becoming infected, based on the way that it has spread on the body will also provide coverage for an inflammatory reaction  On exam your lungs are clear and you are getting enough air without assistance  Medications prescribed will cover for both the airway and your skin  Begin doxycycline every morning and every evening for begin prednisone every morning with food for 5 days  You may continue use of topical products over the rash to further help with itching if any of your symptoms continue to persist or worsen please follow-up for reevaluation

## 2022-09-16 NOTE — ED Provider Notes (Signed)
[facility]    CSN: [id number] Arrival date & time: 09/16/22  1513      History   Chief Complaint Chief Complaint  Patient presents with   Headache    Rashes , body aches , shortness of breath - Entered by patient    HPI Madison Decker is a 32 y.o. female.   Patient presents for evaluation of a rash that started 7 days ago, initially present to the left side of neck, has spread to the right upper arm and posterior neck.  Rash is pruritic, nondraining.  Has had similar symptoms in the past, typically resolving with topical medications.  Has attempted use of cortisone and Neosporin with no improvement.  Began to experience shortness of breath with exertion, body aches and a mild wheezing 1 day ago, nonproductive cough experience this morning.  Has not attempted treatment.  No known sick contacts.  Denies fever nasal congestion, ear pain or sore throat.  History of seasonal allergies.       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 Authorizing Provider  acetaminophen (TYLENOL) 325 MG tablet Take 2 tablets (650 mg total) by mouth every 4 (four) hours as needed (for pain scale < 4). 07/19/19   Venora Maples, MD  cetirizine (ZYRTEC ALLERGY) 10 MG tablet Take 1 tablet (10 mg total) by mouth daily. 05/10/20   Alfonse Spruce, MD  EPINEPHrine 0.3 mg/0.3 mL IJ SOAJ injection Inject 0.3 mg into the muscle as needed for anaphylaxis.  03/13/20   Novella Olive, FNP    Family History Family History  Problem Relation Age of Onset   Cancer Maternal Grandmother        breast   Asthma Mother     Social History Social History   Tobacco Use   Smoking status: Never   Smokeless tobacco: Never  Vaping Use   Vaping Use: Never used  Substance Use Topics   Alcohol use: Yes    Comment: Socially   Drug use: No     Allergies   Patient has no known allergies.   Review of Systems Review of Systems  Neurological:  Positive for headaches.     Physical Exam Triage Vital Signs ED Triage Vitals  Enc Vitals Group     BP 09/16/22 1518 117/79     Pulse Rate 09/16/22 1518 98     Resp 09/16/22 1518 18     Temp 09/16/22 1518 98 F (36.7 C)     Temp Source 09/16/22 1518 Oral     SpO2 09/16/22 1518 94 %     Weight --      Height --  Head Circumference --      Peak Flow --      Pain Score 09/16/22 1520 8     Pain Loc --      Pain Edu? --      Excl. in [place]? --    No data found.  Updated Vital Signs BP 117/79 (BP Location: Left Arm)   Pulse 98   Temp 98 F (36.7 C) (Oral)   Resp 18   LMP 09/08/2022 (Approximate)   SpO2 94%   Visual Acuity Right Eye Distance:   Left Eye Distance:   Bilateral Distance:    Right Eye Near:   Left Eye Near:    Bilateral Near:     Physical Exam Constitutional:      Appearance: Normal appearance. She is well-developed.  Eyes:     Extraocular Movements: Extraocular movements intact.  Cardiovascular:     Rate and Rhythm: Normal rate and regular rhythm.     Pulses: Normal pulses.     Heart sounds: Normal heart sounds.  Pulmonary:     Effort: Pulmonary effort is normal.     Breath sounds: Normal breath sounds.  Skin:    Comments: Erythematous and hyperpigmented dry cracking rash present to the left side of neck, the posterior of the right upper arm into the posterior neck, nondraining, nontender, no swelling noted  Neurological:     Mental Status: She is alert and  oriented to person, place, and time. Mental status is at baseline.      UC Treatments / Results  Labs (all labs ordered are listed, but only abnormal results are displayed) Labs Reviewed - No data to display  EKG   Radiology No results found.  Procedures Procedures (including critical care time)  Medications Ordered in UC Medications - No data to display  Initial Impression / Assessment and Plan / UC Course  I have reviewed the triage vital signs and the nursing notes.  Pertinent labs & imaging results that were available during my care of the patient were reviewed by me and considered in my medical decision making (see chart for details).  Rash Shortness of breath  Presentation of rash is not consistent with 1 particular condition based on the latest brain seems inflammatory however cracking is because open areas of the skin and therefore we will provide bacterial coverage Doxycycline and prednisone prescribed and recommended continued use of topical medications as they have been helpful in the past, may follow-up with urgent care if symptoms persist or worsen  Do not believe that respiratory symptoms are related to above rash, stable at this time, O2 saturation 94% on room air lungs are clear to auscultation, prescribed medications will provide coverage for the airways as well discussed this with patient, advise follow-up as this persist or worsen Final Clinical Impressions(s) / UC Diagnoses   Final diagnoses:  None   Discharge Instructions   None    ED Prescriptions   None    PDMP not reviewed this encounter.   Valinda Hoar, NP 09/16/22 1545

## 2022-10-10 ENCOUNTER — Other Ambulatory Visit: Payer: Self-pay

## 2022-10-10 ENCOUNTER — Emergency Department (HOSPITAL_COMMUNITY)
Admission: EM | Admit: 2022-10-10 | Discharge: 2022-10-10 | Disposition: A | Discharge status: Home / Self Care | Payer: Medicaid Other | Attending: Emergency Medicine | Admitting: Emergency Medicine

## 2022-10-10 ENCOUNTER — Emergency Department (HOSPITAL_COMMUNITY): Payer: Medicaid Other

## 2022-10-10 ENCOUNTER — Encounter (HOSPITAL_COMMUNITY): Payer: Self-pay | Admitting: Emergency Medicine

## 2022-10-10 DIAGNOSIS — L309 Dermatitis, unspecified: Secondary | ICD-10-CM

## 2022-10-10 DIAGNOSIS — L989 Disorder of the skin and subcutaneous tissue, unspecified: Secondary | ICD-10-CM | POA: Insufficient documentation

## 2022-10-10 DIAGNOSIS — Z1152 Encounter for screening for COVID-19: Secondary | ICD-10-CM | POA: Insufficient documentation

## 2022-10-10 DIAGNOSIS — J189 Pneumonia, unspecified organism: Secondary | ICD-10-CM

## 2022-10-10 DIAGNOSIS — R21 Rash and other nonspecific skin eruption: Secondary | ICD-10-CM | POA: Diagnosis present

## 2022-10-10 LAB — BASIC METABOLIC PANEL
Anion gap: 9 (ref 5–15)
BUN: 8 mg/dL (ref 6–20)
CO2: 23 mmol/L (ref 22–32)
Calcium: 8.1 mg/dL — ABNORMAL LOW (ref 8.9–10.3)
Chloride: 102 mmol/L (ref 98–111)
Creatinine, Ser: 0.62 mg/dL (ref 0.44–1.00)
GFR, Estimated: >60 mL/min (ref >60)
Glucose, Bld: 89 mg/dL (ref 70–99)
Potassium: 3 mmol/L — ABNORMAL LOW (ref 3.5–5.1)
Sodium: 134 mmol/L — ABNORMAL LOW (ref 135–145)

## 2022-10-10 LAB — RESP PANEL BY RT-PCR (RSV, FLU A&B, COVID)  RVPGX2
Influenza A by PCR: NEGATIVE
Influenza B by PCR: NEGATIVE
Resp Syncytial Virus by PCR: NEGATIVE
SARS Coronavirus 2 by RT PCR: NEGATIVE

## 2022-10-10 LAB — CBC
HCT: 37.7 % (ref 36.0–46.0)
Hemoglobin: 12.3 g/dL (ref 12.0–15.0)
MCH: 28.4 pg (ref 26.0–34.0)
MCHC: 32.6 g/dL (ref 30.0–36.0)
MCV: 87.1 fL (ref 80.0–100.0)
Platelets: 185 10*3/uL (ref 150–400)
RBC: 4.33 MIL/uL (ref 3.87–5.11)
RDW: 14.2 % (ref 11.5–15.5)
WBC: 4.5 10*3/uL (ref 4.0–10.5)
nRBC: 0 % (ref 0.0–0.2)

## 2022-10-10 LAB — D-DIMER, QUANTITATIVE: D-Dimer, Quant: 1.42 ug/mL-FEU — ABNORMAL HIGH (ref 0.00–0.50)

## 2022-10-10 LAB — POC URINE PREG, ED
Preg Test, Ur: NEGATIVE
Preg Test, Ur: NEGATIVE

## 2022-10-10 MED ORDER — AZITHROMYCIN 250 MG PO TABS: 250.0000 mg | ORAL_TABLET | Freq: Every day | ORAL | 0 refills | Status: DC

## 2022-10-10 MED ORDER — DOXYCYCLINE HYCLATE 100 MG PO TABS: 100.0000 mg | ORAL_TABLET | Freq: Once | ORAL | Status: DC

## 2022-10-10 MED ORDER — DOXYCYCLINE HYCLATE 100 MG PO CAPS: 100.0000 mg | ORAL_CAPSULE | Freq: Two times a day (BID) | ORAL | 0 refills | Status: DC

## 2022-10-10 MED ORDER — TRIAMCINOLONE ACETONIDE 0.1 % EX CREA: 1.0000 | TOPICAL_CREAM | Freq: Two times a day (BID) | CUTANEOUS | 0 refills | Status: DC

## 2022-10-10 MED ORDER — AZITHROMYCIN 250 MG PO TABS
500.0000 mg | ORAL_TABLET | Freq: Once | ORAL | Status: AC
  Administered 2022-10-10: 500 mg via ORAL
  Filled 2022-10-10: qty 2

## 2022-10-10 MED ORDER — POTASSIUM CHLORIDE CRYS ER 20 MEQ PO TBCR
40.0000 meq | EXTENDED_RELEASE_TABLET | Freq: Once | ORAL | Status: AC
  Administered 2022-10-10: 40 meq via ORAL
  Filled 2022-10-10: qty 2

## 2022-10-10 MED ORDER — IOHEXOL 350 MG/ML SOLN
75.0000 mL | Freq: Once | INTRAVENOUS | Status: AC | PRN
  Administered 2022-10-10: 75 mL via INTRAVENOUS

## 2022-10-10 NOTE — ED Triage Notes (Signed)
Pt ambulatory to triage with c/o body aches, shob, and rash to upper arms and legs.  Pt states symptoms started on Monday.

## 2022-10-10 NOTE — ED Provider Notes (Signed)
[place] EMERGENCY DEPARTMENT AT [facility] Provider Note   CSN: [id number] Arrival date & time: 10/10/22  1614     History  No chief complaint on file.   Madison Decker is a 32 y.o. female with a history of angioedema but not in several years, unclear etiology, presenting with a rash that started near the beginning of this month.  She describes it started as a small raised lesion which she initially thought was an insect bite, it was itchy and it has since spread causing persistent itching and scabbing at the site on the posterior of her neck.  Since that event she has had several areas pop up, 1 on her left upper lateral arm, another on her left lateral thigh.  She was seen at a urgent care and placed on a 5-day prednisone taper which improved the symptoms but it escalated again about a week ago and persist today.  Additionally she has noted for the past several weeks she has had some shortness of breath with exertion, stating simply walking from her car into her employment can leave her significantly winded to the point where she has to sit and rest before she starts her job.  She works in Engineering geologist at [employer].  She denies fevers or chills, she denies wheezing, cough, URI type symptoms.  She denies dizziness or lightheadedness, denies chest pain, does endorse palpitations with exertion and the shortness of breath.  She has not had any orthopnea, no peripheral edema or unilateral leg pain or swelling.  She is not on oral contraceptives.  No family history of heart or lung disease at an early age, does endorse a sister with asthma.  The history is provided by the patient.       Home Medications Prior to Admission medications   Medication Sig Start Date End Date Taking? Authorizing Provider  acetaminophen (TYLENOL) 325 MG tablet Take 2 tablets (650 mg total) by mouth every 4 (four) hours as needed (for pain scale < 4). 07/19/19   Venora Maples, MD  cetirizine (ZYRTEC ALLERGY)  10 MG tablet Take 1 tablet (10 mg total) by mouth daily. 05/10/20   Alfonse Spruce, MD  doxycycline (VIBRAMYCIN) 100 MG capsule Take 1 capsule (100 mg total) by mouth 2 (two) times daily. 09/16/22   White, Elita Boone, NP  EPINEPHrine 0.3 mg/0.3 mL IJ SOAJ injection Inject 0.3 mg into the muscle as needed for anaphylaxis. 03/13/20   Novella Olive, FNP  predniSONE (DELTASONE) 20 MG tablet Take 2 tablets (40 mg total) by mouth daily. 09/16/22   Valinda Hoar, NP      Allergies    Patient has no known allergies.    Review of Systems   Review of Systems  Constitutional:  Negative for chills and fever.  HENT:  Negative for congestion, facial swelling, sore throat and trouble swallowing.   Eyes: Negative.   Respiratory:  Positive for shortness of breath. Negative for cough, chest tightness, wheezing and stridor.   Cardiovascular:  Positive for palpitations. Negative for chest pain.  Gastrointestinal:  Negative for abdominal pain, nausea and vomiting.  Genitourinary: Negative.   Musculoskeletal:  Negative for arthralgias, joint swelling and neck pain.  Skin:  Positive for rash. Negative for wound.  Neurological:  Negative for dizziness, weakness, light-headedness, numbness and headaches.  Psychiatric/Behavioral: Negative.    All other systems reviewed and are negative.   Physical Exam Updated Vital Signs BP 106/77   Pulse (!) 108   Temp  98.4 F (36.9 C) (Oral)   Resp (!) 28   Ht 5\' 5"  (1.651 m)   Wt 84.1 kg   LMP 09/08/2022 (Approximate)   SpO2 97%   BMI 30.84 kg/m  Physical Exam Vitals and nursing note reviewed.  Constitutional:      Appearance: She is well-developed.  HENT:     Head: Normocephalic and atraumatic.  Eyes:     Conjunctiva/sclera: Conjunctivae normal.  Cardiovascular:     Rate and Rhythm: Normal rate and regular rhythm.     Heart sounds: Normal heart sounds.  Pulmonary:     Effort: Pulmonary effort is normal.     Breath sounds: Normal breath sounds. No  wheezing.  Abdominal:     General: Bowel sounds are normal.     Palpations: Abdomen is soft.     Tenderness: There is no abdominal tenderness.  Musculoskeletal:        General: Normal range of motion.     Cervical back: Normal range of motion.  Skin:    General: Skin is warm and dry.     Findings: Rash present. Rash is macular.     Comments: Macular type dry lesions on her posterior neck, left upper arm, left lateral upper thigh with excoriations.  Neurological:     Mental Status: She is alert.     ED Results / Procedures / Treatments   Labs (all labs ordered are listed, but only abnormal results are displayed) Labs Reviewed  BASIC METABOLIC PANEL - Abnormal; Notable for the following components:      Result Value   Sodium 134 (*)    Potassium 3.0 (*)    Calcium 8.1 (*)    All other components within normal limits  D-DIMER, QUANTITATIVE - Abnormal; Notable for the following components:   D-Dimer, Quant 1.42 (*)    All other components within normal limits  RESP PANEL BY RT-PCR (RSV, FLU A&B, COVID)  RVPGX2  CBC  POC URINE PREG, ED    EKG None  Radiology DG Chest 2 View  Result Date: 10/10/2022 CLINICAL DATA:  Shortness of breath and tachycardia EXAM: CHEST - 2 VIEW COMPARISON:  None Available. FINDINGS: The heart size and mediastinal contours are within normal limits. Multifocal patchy and heterogeneous pulmonary opacities. No pleural effusion or pneumothorax. The visualized upper abdomen is unremarkable. No acute osseous abnormality. IMPRESSION: Multifocal patchy heterogeneous pulmonary opacities, concerning for infectious or inflammatory etiology. Electronically Signed   By: Jacob Moores M.D.   On: 10/10/2022 19:00    Procedures Procedures    Medications Ordered in ED Medications  potassium chloride SA (KLOR-CON M) CR tablet 40 mEq (40 mEq Oral Given 10/10/22 1927)    ED Course/ Medical Decision Making/ A&P                             Medical Decision  Making Patient presenting with multiple complaints, first being intermittent shortness of breath, triggered by exertion along with palpitations present for the past several weeks.  Also some vague generalized myalgias without fever.  Secondary complaint is for a macular pruritic rash which started on the back of her neck, now has areas on her left arm and leg, was transiently improved with a short course of prednisone but now has returned.  Differential diagnosis including eczema or contact dermatitis, allergic reaction.  Shortness of breath including pneumonia, PE, respiratory infection including viral or bacterial, asthma or bronchitis.  Labs and imaging  obtained sig for a chest x-ray suggesting inflammatory versus multifocal pneumonia.  She also has an elevated D-dimer at 1.42.  She will need CT imaging for further define the process.  She also has a low potassium of 3.0.  She is not wheezing today, no rhonchi, no history of orthopnea.  Depending on CT imaging results, patient may need antibiotics if she is negative for PE.  Would also recommend possibly oral but definitely topical steroids for her rash.  Discussed with Wynonia Hazard, PA-C who assumes care of patient.  Amount and/or Complexity of Data Reviewed Labs: ordered.    Details: Elevated D-dimer at 1.42, potassium 3.0 white count of 4.5 Radiology: ordered.    Details: Chest x-ray reviewed, concerning for multifocal pneumonia versus inflammatory process.           Final Clinical Impression(s) / ED Diagnoses Final diagnoses:  None    Rx / DC Orders ED Discharge Orders     None         Victoriano Lain 10/10/22 1930    Jacalyn Lefevre, MD 10/11/22 1529

## 2022-10-10 NOTE — Discharge Instructions (Addendum)
Please follow-up with your primary care provider regarding recent symptoms and ER visit.  Today your CT shows you most likely have a pneumonia causing your symptoms.  You were treated with 1 dose of azithromycin while in the ER however you will need to pick up the rest of the medication at your pharmacy.  Please remain hydrated and use Tylenol every 6 hours as needed for pain.  I have also prescribed you a steroid cream to use for your eczema as well.  If symptoms change or worsen please return to ER.

## 2022-10-10 NOTE — ED Provider Notes (Signed)
Patient given in sign out by Burgess Amor, PA-C.  Please review their note for patient HPI, physical exam, workup.  At this time the plan is to follow-up with patient CTA due to elevated D-dimer and dispo accordingly.  Anticipate discharge with topical steroids at [facility] due to rash most likely eczema.  Patient CT shows patient most likely has multi lobar pneumonia.  Patient has tried doxycycline to begin the month which is failed and so patient will be given azithromycin.  Patient was given 1 dose before discharge.  Patient also be given Kenalog cream for her eczema.  Patient will be encouraged to follow-up with her primary care provider regarding recent symptoms and ER visit.  At this time patient is stable for discharge and reasonable for outpatient therapy.  Patient verbalized understanding and agreement of this plan.    Remi Deter 10/10/22 2051    Jacalyn Lefevre, MD 10/11/22 [phone]

## 2022-10-16 ENCOUNTER — Other Ambulatory Visit: Payer: Self-pay

## 2022-10-16 ENCOUNTER — Inpatient Hospital Stay (HOSPITAL_COMMUNITY)
Admission: EM | Admit: 2022-10-16 | Discharge: 2022-10-18 | DRG: 195 | Disposition: A | Discharge status: Home / Self Care | Payer: Medicaid Other | Attending: Family Medicine | Admitting: Family Medicine

## 2022-10-16 ENCOUNTER — Encounter (HOSPITAL_COMMUNITY): Payer: Self-pay | Admitting: Emergency Medicine

## 2022-10-16 ENCOUNTER — Emergency Department (HOSPITAL_COMMUNITY): Payer: Medicaid Other

## 2022-10-16 DIAGNOSIS — R0902 Hypoxemia: Secondary | ICD-10-CM | POA: Diagnosis present

## 2022-10-16 DIAGNOSIS — Z1152 Encounter for screening for COVID-19: Secondary | ICD-10-CM

## 2022-10-16 DIAGNOSIS — Z825 Family history of asthma and other chronic lower respiratory diseases: Secondary | ICD-10-CM

## 2022-10-16 DIAGNOSIS — J189 Pneumonia, unspecified organism: Secondary | ICD-10-CM | POA: Diagnosis not present

## 2022-10-16 DIAGNOSIS — Z79899 Other long term (current) drug therapy: Secondary | ICD-10-CM

## 2022-10-16 DIAGNOSIS — J188 Other pneumonia, unspecified organism: Secondary | ICD-10-CM | POA: Diagnosis present

## 2022-10-16 DIAGNOSIS — Z6830 Body mass index (BMI) 30.0-30.9, adult: Secondary | ICD-10-CM

## 2022-10-16 DIAGNOSIS — R0600 Dyspnea, unspecified: Secondary | ICD-10-CM

## 2022-10-16 DIAGNOSIS — E669 Obesity, unspecified: Secondary | ICD-10-CM | POA: Diagnosis present

## 2022-10-16 DIAGNOSIS — E876 Hypokalemia: Secondary | ICD-10-CM

## 2022-10-16 DIAGNOSIS — Z853 Personal history of malignant neoplasm of breast: Secondary | ICD-10-CM

## 2022-10-16 LAB — RESPIRATORY PANEL BY PCR

## 2022-10-16 LAB — RAPID URINE DRUG SCREEN, HOSP PERFORMED
Amphetamines: NOT DETECTED
Barbiturates: NOT DETECTED
Benzodiazepines: NOT DETECTED
Cocaine: NOT DETECTED
Opiates: NOT DETECTED
Tetrahydrocannabinol: NOT DETECTED

## 2022-10-16 LAB — BASIC METABOLIC PANEL
Anion gap: 9 (ref 5–15)
BUN: 11 mg/dL (ref 6–20)
CO2: 24 mmol/L (ref 22–32)
Calcium: 8.8 mg/dL — ABNORMAL LOW (ref 8.9–10.3)
Chloride: 100 mmol/L (ref 98–111)
Creatinine, Ser: 0.71 mg/dL (ref 0.44–1.00)
GFR, Estimated: >60 mL/min (ref >60)
Glucose, Bld: 97 mg/dL (ref 70–99)
Potassium: 3.3 mmol/L — ABNORMAL LOW (ref 3.5–5.1)
Sodium: 133 mmol/L — ABNORMAL LOW (ref 135–145)

## 2022-10-16 LAB — HEPATIC FUNCTION PANEL
ALT: 25 U/L (ref 0–44)
AST: 46 U/L — ABNORMAL HIGH (ref 15–41)
Albumin: 3.3 g/dL — ABNORMAL LOW (ref 3.5–5.0)
Alkaline Phosphatase: 58 U/L (ref 38–126)
Bilirubin, Direct: 0.1 mg/dL (ref 0.0–0.2)
Indirect Bilirubin: 0.6 mg/dL (ref 0.3–0.9)
Total Bilirubin: 0.7 mg/dL (ref 0.3–1.2)
Total Protein: 7.9 g/dL (ref 6.5–8.1)

## 2022-10-16 LAB — CBC
HCT: 43.7 % (ref 36.0–46.0)
Hemoglobin: 14.3 g/dL (ref 12.0–15.0)
MCH: 28.4 pg (ref 26.0–34.0)
MCHC: 32.7 g/dL (ref 30.0–36.0)
MCV: 86.9 fL (ref 80.0–100.0)
Platelets: 252 10*3/uL (ref 150–400)
RBC: 5.03 MIL/uL (ref 3.87–5.11)
RDW: 14.1 % (ref 11.5–15.5)
WBC: 4.6 10*3/uL (ref 4.0–10.5)
nRBC: 0 % (ref 0.0–0.2)

## 2022-10-16 LAB — PROCALCITONIN: Procalcitonin: <0.1 ng/mL

## 2022-10-16 LAB — MAGNESIUM: Magnesium: 2 mg/dL (ref 1.7–2.4)

## 2022-10-16 LAB — SARS CORONAVIRUS 2 BY RT PCR: SARS Coronavirus 2 by RT PCR: NEGATIVE

## 2022-10-16 LAB — BRAIN NATRIURETIC PEPTIDE: B Natriuretic Peptide: 7 pg/mL (ref 0.0–100.0)

## 2022-10-16 MED ORDER — ACETAMINOPHEN 650 MG RE SUPP: 650.0000 mg | Freq: Four times a day (QID) | RECTAL | Status: DC | PRN

## 2022-10-16 MED ORDER — ALBUTEROL SULFATE (2.5 MG/3ML) 0.083% IN NEBU
2.5000 mg | INHALATION_SOLUTION | Freq: Four times a day (QID) | RESPIRATORY_TRACT | Status: DC
  Administered 2022-10-16: 2.5 mg via RESPIRATORY_TRACT
  Filled 2022-10-16: qty 3

## 2022-10-16 MED ORDER — ONDANSETRON HCL 4 MG/2ML IJ SOLN: 4.0000 mg | Freq: Four times a day (QID) | INTRAMUSCULAR | Status: DC | PRN

## 2022-10-16 MED ORDER — SODIUM CHLORIDE 0.9 % IV SOLN
2.0000 g | Freq: Once | INTRAVENOUS | Status: AC
  Administered 2022-10-16: 2 g via INTRAVENOUS
  Filled 2022-10-16: qty 20

## 2022-10-16 MED ORDER — SODIUM CHLORIDE 0.9 % IV SOLN: INTRAVENOUS | Status: DC

## 2022-10-16 MED ORDER — SODIUM CHLORIDE 0.9 % IV SOLN
500.0000 mg | INTRAVENOUS | Status: DC
  Administered 2022-10-16: 500 mg via INTRAVENOUS
  Filled 2022-10-16: qty 5

## 2022-10-16 MED ORDER — ONDANSETRON HCL 4 MG PO TABS: 4.0000 mg | ORAL_TABLET | Freq: Four times a day (QID) | ORAL | Status: DC | PRN

## 2022-10-16 MED ORDER — SODIUM CHLORIDE 0.9 % IV SOLN
2.0000 g | INTRAVENOUS | Status: DC
  Administered 2022-10-17 – 2022-10-18 (×2): 2 g via INTRAVENOUS
  Filled 2022-10-16 (×2): qty 20

## 2022-10-16 MED ORDER — POTASSIUM CHLORIDE CRYS ER 20 MEQ PO TBCR
40.0000 meq | EXTENDED_RELEASE_TABLET | Freq: Once | ORAL | Status: AC
  Administered 2022-10-16: 40 meq via ORAL
  Filled 2022-10-16: qty 2

## 2022-10-16 MED ORDER — SODIUM CHLORIDE 0.9 % IV BOLUS
1000.0000 mL | Freq: Once | INTRAVENOUS | Status: AC
  Administered 2022-10-16: 1000 mL via INTRAVENOUS

## 2022-10-16 MED ORDER — SODIUM CHLORIDE 0.9 % IV SOLN
500.0000 mg | INTRAVENOUS | Status: DC
  Administered 2022-10-17 – 2022-10-18 (×2): 500 mg via INTRAVENOUS
  Filled 2022-10-16 (×2): qty 5

## 2022-10-16 MED ORDER — ACETAMINOPHEN 325 MG PO TABS: 650.0000 mg | ORAL_TABLET | Freq: Four times a day (QID) | ORAL | Status: DC | PRN

## 2022-10-16 MED ORDER — ALBUTEROL SULFATE (2.5 MG/3ML) 0.083% IN NEBU: 2.5000 mg | INHALATION_SOLUTION | Freq: Four times a day (QID) | RESPIRATORY_TRACT | Status: DC | PRN

## 2022-10-16 MED ORDER — ENSURE ENLIVE PO LIQD
237.0000 mL | Freq: Two times a day (BID) | ORAL | Status: DC
  Administered 2022-10-17 – 2022-10-18 (×3): 237 mL via ORAL

## 2022-10-16 NOTE — Assessment & Plan Note (Signed)
32 year old presenting with 1 month history of worsening shortness of breath, dyspnea on exertion and mild dry cough found to have multifocal pneumonia despite outpatient treatment with doxycycline and azithromycin  -obs to med-surg -BC obtained -check urinary antigens -covid/RVP and trend PCT -continue rocephin and azithromycin -albuterol Q6 x 2 -gentle IVF overnight -CTA chest on 6/29 showed negative PE, but very extensive heterogeneous and consolidative airspace opacity throughout the lungs, particularly of the left lung and bilatearl lung bases. Consistent with multifocal infection. Numerous enlarged mediastinal and hilar lymph nodes, likely reactive. Cardiomegaly -bnp/echo pending

## 2022-10-16 NOTE — H&P (Signed)
History and Physical    Patient: Madison Decker ZOX:[id number] DOB: [birth year] DOA: 10/16/2022 DOS: the patient was seen and examined on 10/16/2022 PCP: Pcp, No  Patient coming from: Home - lives with her daughter.    Chief Complaint: shortness of breath/cough and feeling unwell   HPI: Madison Decker is a 32 y.o. female with medical history significant of post partum depression in 2021 who presented to ED with complaints of worsening shortness of breath and cough despite being given medication for pneumonia last Saturday. She was given doxycycline and a zpack and steroids and has been taking these, but despite this she has had continued body aches, shortness of breath and coughing.   She states she went to UC on 6/6 for chest pain and some rashes. She had no cough, but a little shortness of breath. She was given doxycycline and told she had a viral infection. She states she started to have more energy, but then at end of June started to have more shortness of breath that seemed to be more frequent so went to ED on 6/29. Still no cough. Denies any sick contacts. She states during this time she also started to have worsening dyspnea on exertion. Denies any leg swelling, but has had weight loss (not on purpose) She has lost 30 pounds. She has no night sweats and denies any lymph nodes. She has no blood in her stool. Denies any orthopnea. Her appetite is decreased, but drinking well.   Seen at ED on 6/29. CTA of chest showed negative PE, but very extensive heterogeneous and consolidative airspace opacity throughout the lungs, particularly of the left lung and bilatearl lung bases. Consistent with multifocal infection. Numerous enlarged mediastinal and hilar lymph nodes, likely reactive. Cardiomegaly    Denies any fever/chills, vision changes/headaches, chest pain or palpitations, abdominal pain, N/V/D, dysuria or leg swelling.    She does not smoke or drink alcohol.   ER Course:  vitals: afebrile,  bp: 106/77, HR 108, RR: 30, oxygen: 90% on RA  Pertinent labs: potassium: 3.3 CXR: . No significant change in patchy airspace opacities within the left mid to lower lung and right lower lung. This may be minimally improved, but that may be secondary to improved overall air volumes on the current study. This is again suggestive of multifocal infection. 2. Mild cardiomegaly. In ED: given 1L IVF bolus, potassium and started on rocephin and zithromax. BC obtained. TRH asked to admit.    Review of Systems: As mentioned in the history of present illness. All other systems reviewed and are negative. Past Medical History:  Diagnosis Date   Angio-edema    Medical history non-contributory    Past Surgical History:  Procedure Laterality Date   ARTHROSCOPIC REPAIR ACL Right    Social History:  reports that she has never smoked. She has never used smokeless tobacco. She reports current alcohol use. She reports that she does not use drugs.  No Known Allergies  Family History  Problem Relation Age of Onset   Cancer Maternal Grandmother        breast   Asthma Mother     Prior to Admission medications   Medication Sig Start Date End Date Taking? Authorizing Provider  acetaminophen (TYLENOL) 325 MG tablet Take 2 tablets (650 mg total) by mouth every 4 (four) hours as needed (for pain scale < 4). 07/19/19   Venora Maples, MD  azithromycin (ZITHROMAX) 250 MG tablet Take 1 tablet (250 mg total) by mouth daily. Take first 2  tablets together, then 1 every day until finished. 10/10/22   Netta Corrigan, PA-C  cetirizine (ZYRTEC ALLERGY) 10 MG tablet Take 1 tablet (10 mg total) by mouth daily. 05/10/20   Alfonse Spruce, MD  doxycycline (VIBRAMYCIN) 100 MG capsule Take 1 capsule (100 mg total) by mouth 2 (two) times daily. 09/16/22   White, Elita Boone, NP  EPINEPHrine 0.3 mg/0.3 mL IJ SOAJ injection Inject 0.3 mg into the muscle as needed for anaphylaxis. 03/13/20   Novella Olive, FNP  predniSONE  (DELTASONE) 20 MG tablet Take 2 tablets (40 mg total) by mouth daily. 09/16/22   White, Elita Boone, NP  triamcinolone cream (KENALOG) 0.1 % Apply 1 Application topically 2 (two) times daily. 10/10/22   Netta Corrigan, PA-C    Physical Exam: Vitals:   10/16/22 1824 10/16/22 1830 10/16/22 1830 10/16/22 1845  BP:   106/73 106/76  Pulse: 100 99 94 93  Resp: (!) 26 (!) 30 (!) 24 (!) 32  Temp:   97.8 F (36.6 C)   TempSrc:   Oral   SpO2: 97% 97% 98% 98%  Weight:      Height:       General:  Appears calm and comfortable and is in NAD Eyes:  PERRL, EOMI, normal lids, iris ENT:  grossly normal hearing, lips & tongue, mmm; appropriate dentition Neck:  no LAD, masses or thyromegaly; no carotid bruits Cardiovascular:  RRR, no m/r/g. No LE edema.  Respiratory:   decreased breath sounds in bilateral bases L>R. Decreased breath sounds in mid to lower left lung. No wheezing or rales/crackles  Normal respiratory effort. Abdomen:  soft, NT, ND, NABS Back:   normal alignment, no CVAT Skin:  no rash or induration seen on limited exam Musculoskeletal:  grossly normal tone BUE/BLE, good ROM, no bony abnormality Lower extremity:  No LE edema.  Limited foot exam with no ulcerations.  2+ distal pulses. Psychiatric:  grossly normal mood and affect, speech fluent and appropriate, AOx3 Neurologic:  CN 2-12 grossly intact, moves all extremities in coordinated fashion, sensation intact   Radiological Exams on Admission: Independently reviewed - see discussion in A/P where applicable  DG Chest Portable 1 View  Result Date: 10/16/2022 CLINICAL DATA:  Cough. Continuous shortness of breath, cough, body aches. Diagnosed with pneumonia 6 days ago. Patient has been taking meds as prescribed. EXAM: PORTABLE CHEST 1 VIEW COMPARISON:  Chest radiographs 10/10/2022 and CTA chest 10/10/2022 FINDINGS: Cardiac silhouette is again mildly enlarged. Mediastinal contours are within normal limits. No significant change in patchy  airspace opacities within the left mid to lower lung and right lower lung. This may be minimally improved, potentially due to improved overall air volumes on the current study. No pneumothorax. It is difficult to exclude a small left pleural effusion, however none was seen on recent CT. No acute skeletal abnormality. IMPRESSION: 1. No significant change in patchy airspace opacities within the left mid to lower lung and right lower lung. This may be minimally improved, but that may be secondary to improved overall air volumes on the current study. This is again suggestive of multifocal infection. 2. Mild cardiomegaly. Electronically Signed   By: Neita Garnet M.D.   On: 10/16/2022 14:47    EKG: Independently reviewed.  Sinus tachy with rate 106; nonspecific ST changes with no evidence of acute ischemia   Labs on Admission: I have personally reviewed the available labs and imaging studies at the time of the admission.  Pertinent labs:  potassium: 3.3  Assessment and Plan: Principal Problem:   Multifocal pneumonia Active Problems:   Hypokalemia   Dyspnea with cardiomegaly in setting of pneumonia    Assessment and Plan: * Multifocal pneumonia 33 year old presenting with 1 month history of worsening shortness of breath, dyspnea on exertion and mild dry cough found to have multifocal pneumonia despite outpatient treatment with doxycycline and azithromycin  -obs to med-surg -BC obtained -check urinary antigens -covid/RVP and trend PCT -continue rocephin and azithromycin -albuterol Q6 x 2 -gentle IVF overnight -CTA chest on 6/29 showed negative PE, but very extensive heterogeneous and consolidative airspace opacity throughout the lungs, particularly of the left lung and bilatearl lung bases. Consistent with multifocal infection. Numerous enlarged mediastinal and hilar lymph nodes, likely reactive. Cardiomegaly -bnp/echo pending   Hypokalemia Check magnesium Repleted in ED Trend    Dyspnea with cardiomegaly in setting of pneumonia Has had some chest pain back in June with worsening dyspnea and cardiomegaly on CT Check echo     Advance Care Planning:   Code Status: Full Code   Consults: none   DVT Prophylaxis: ambulation/TED hose   Family Communication: none   Severity of Illness: The appropriate patient status for this patient is OBSERVATION. Observation status is judged to be reasonable and necessary in order to provide the required intensity of service to ensure the patient's safety. The patient's presenting symptoms, physical exam findings, and initial radiographic and laboratory data in the context of their medical condition is felt to place them at decreased risk for further clinical deterioration. Furthermore, it is anticipated that the patient will be medically stable for discharge from the hospital within 2 midnights of admission.   Author: Orland Mustard, MD 10/16/2022 7:22 PM  For on call review [email or web address].

## 2022-10-16 NOTE — Assessment & Plan Note (Signed)
Check magnesium Repleted in ED Trend

## 2022-10-16 NOTE — Assessment & Plan Note (Addendum)
Has had some chest pain back in June with worsening dyspnea and cardiomegaly on CT Check echo

## 2022-10-16 NOTE — ED Notes (Signed)
PT walked back to ED room 14 SOB on exertion Seen for pneumonia Saturday Finished steroids, not feeling better  Pt did not f/u with PCP, pt does not have PCP Attached to partial monitor Vitals listed

## 2022-10-16 NOTE — ED Notes (Signed)
12 lead EKG completed in triage Hard copy in this rn's hands Did not cross over into chart

## 2022-10-16 NOTE — ED Notes (Signed)
12 lead repeated to upload Blood cultures being collected

## 2022-10-16 NOTE — ED Provider Notes (Signed)
[place] EMERGENCY DEPARTMENT AT [facility] Provider Note  CSN: [id number] Arrival date & time: 10/16/22 1342  Chief Complaint(s) Shortness of Breath  HPI Madison Decker is a 32 y.o. female without significant past medical history, distant history of angioedema presenting to the emergency department with shortness of breath.  Patient reports shortness of breath worsening over the past month.  Was initially seen at urgent care 6/5 and given doxycycline primarily for rash, subsequently had worsening symptoms and went to the ER 6/29 and was prescribed Z-Pak, however she continues to have dry cough, shortness of breath with minimal exertion and significant fatigue.  No fevers or chills.  No nausea or vomiting.  No abdominal pain.  No leg swelling.  She was seen in the emergency department on the 29th and had CT scan to evaluate for pulmonary embolism which did not show pulmonary embolism but did showed multifocal pneumonia.  She returns given her persistent symptoms.   Past Medical History Past Medical History:  Diagnosis Date   Angio-edema    Medical history non-contributory    Patient Active Problem List   Diagnosis Date Noted   Multifocal pneumonia 10/16/2022   Hypokalemia 10/16/2022   Dyspnea with cardiomegaly in setting of pneumonia 10/16/2022   Angio-edema 03/13/2020   Postpartum depression 12/06/2019   Home Medication(s) Prior to Admission medications   Medication Sig Start Date End Date Taking? Authorizing Provider  acetaminophen (TYLENOL) 325 MG tablet Take 2 tablets (650 mg total) by mouth every 4 (four) hours as needed (for pain scale < 4). 07/19/19   Venora Maples, MD  azithromycin (ZITHROMAX) 250 MG tablet Take 1 tablet (250 mg total) by mouth daily. Take first 2 tablets together, then 1 every day until finished. 10/10/22   Netta Corrigan, PA-C  cetirizine (ZYRTEC ALLERGY) 10 MG tablet Take 1 tablet (10 mg total) by mouth daily. 05/10/20   Alfonse Spruce, MD  doxycycline (VIBRAMYCIN) 100 MG capsule Take 1 capsule (100 mg total) by mouth 2 (two) times daily. 09/16/22   White, Elita Boone, NP  EPINEPHrine 0.3 mg/0.3 mL IJ SOAJ injection Inject 0.3 mg into the muscle as needed for anaphylaxis. 03/13/20   Novella Olive, FNP  predniSONE (DELTASONE) 20 MG tablet Take 2 tablets (40 mg total) by mouth daily. 09/16/22   White, Elita Boone, NP  triamcinolone cream (KENALOG) 0.1 % Apply 1 Application topically 2 (two) times daily. 10/10/22   Remi Deter                                                                                                                                    Past Surgical History Past Surgical History:  Procedure Laterality Date   ARTHROSCOPIC REPAIR ACL Right    Family History Family History  Problem Relation Age of Onset   Cancer Maternal Grandmother        breast   Asthma  Mother     Social History Social History   Tobacco Use   Smoking status: Never   Smokeless tobacco: Never  Vaping Use   Vaping Use: Never used  Substance Use Topics   Alcohol use: Yes    Comment: Socially   Drug use: No   Allergies Patient has no known allergies.  Review of Systems Review of Systems  All other systems reviewed and are negative.   Physical Exam Vital Signs  I have reviewed the triage vital signs BP 106/76   Pulse 93   Temp 97.8 F (36.6 C) (Oral)   Resp (!) 32   Ht 5\' 5"  (1.651 m)   Wt 83.9 kg   LMP 09/08/2022 (Approximate)   SpO2 98%   BMI 30.79 kg/m  Physical Exam Vitals and nursing note reviewed.  Constitutional:      General: She is not in acute distress.    Appearance: She is well-developed.  HENT:     Head: Normocephalic and atraumatic.     Mouth/Throat:     Mouth: Mucous membranes are moist.  Eyes:     Pupils: Pupils are equal, round, and reactive to light.  Cardiovascular:     Rate and Rhythm: Normal rate and regular rhythm.     Heart sounds: No murmur heard. Pulmonary:     Effort:  Pulmonary effort is normal. No respiratory distress.     Breath sounds: Examination of the left-lower field reveals rales. Rales present.  Abdominal:     General: Abdomen is flat.     Palpations: Abdomen is soft.     Tenderness: There is no abdominal tenderness.  Musculoskeletal:        General: No tenderness.     Right lower leg: No edema.     Left lower leg: No edema.  Skin:    General: Skin is warm and dry.  Neurological:     General: No focal deficit present.     Mental Status: She is alert. Mental status is at baseline.  Psychiatric:        Mood and Affect: Mood normal.        Behavior: Behavior normal.     ED Results and Treatments Labs (all labs ordered are listed, but only abnormal results are displayed) Labs Reviewed  BASIC METABOLIC PANEL - Abnormal; Notable for the following components:      Result Value   Sodium 133 (*)    Potassium 3.3 (*)    Calcium 8.8 (*)    All other components within normal limits  CULTURE, BLOOD (ROUTINE X 2)  CULTURE, BLOOD (ROUTINE X 2)  RESPIRATORY PANEL BY PCR  SARS CORONAVIRUS 2 BY RT PCR  CBC  MAGNESIUM  RAPID URINE DRUG SCREEN, HOSP PERFORMED  BRAIN NATRIURETIC PEPTIDE  PROCALCITONIN  LEGIONELLA PNEUMOPHILA SEROGP 1 UR AG  STREP PNEUMONIAE URINARY ANTIGEN  BASIC METABOLIC PANEL  CBC  HIV ANTIBODY (ROUTINE TESTING W REFLEX)  Radiology DG Chest Portable 1 View  Result Date: 10/16/2022 CLINICAL DATA:  Cough. Continuous shortness of breath, cough, body aches. Diagnosed with pneumonia 6 days ago. Patient has been taking meds as prescribed. EXAM: PORTABLE CHEST 1 VIEW COMPARISON:  Chest radiographs 10/10/2022 and CTA chest 10/10/2022 FINDINGS: Cardiac silhouette is again mildly enlarged. Mediastinal contours are within normal limits. No significant change in patchy airspace opacities within the left mid to lower  lung and right lower lung. This may be minimally improved, potentially due to improved overall air volumes on the current study. No pneumothorax. It is difficult to exclude a small left pleural effusion, however none was seen on recent CT. No acute skeletal abnormality. IMPRESSION: 1. No significant change in patchy airspace opacities within the left mid to lower lung and right lower lung. This may be minimally improved, but that may be secondary to improved overall air volumes on the current study. This is again suggestive of multifocal infection. 2. Mild cardiomegaly. Electronically Signed   By: Neita Garnet M.D.   On: 10/16/2022 14:47    Pertinent labs & imaging results that were available during my care of the patient were reviewed by me and considered in my medical decision making (see MDM for details).  Medications Ordered in ED Medications  azithromycin (ZITHROMAX) 500 mg in sodium chloride 0.9 % 250 mL IVPB (500 mg Intravenous New Bag/Given 10/16/22 1904)  acetaminophen (TYLENOL) tablet 650 mg (has no administration in time range)    Or  acetaminophen (TYLENOL) suppository 650 mg (has no administration in time range)  ondansetron (ZOFRAN) tablet 4 mg (has no administration in time range)    Or  ondansetron (ZOFRAN) injection 4 mg (has no administration in time range)  albuterol (PROVENTIL) (2.5 MG/3ML) 0.083% nebulizer solution 2.5 mg (has no administration in time range)  0.9 %  sodium chloride infusion (has no administration in time range)  cefTRIAXone (ROCEPHIN) 2 g in sodium chloride 0.9 % 100 mL IVPB (0 g Intravenous Stopped 10/16/22 1905)  sodium chloride 0.9 % bolus 1,000 mL (1,000 mLs Intravenous New Bag/Given 10/16/22 1829)  potassium chloride SA (KLOR-CON M) CR tablet 40 mEq (40 mEq Oral Given 10/16/22 1823)                                                                                                                                     Procedures Procedures  (including critical  care time)  Medical Decision Making / ED Course   MDM:  32 year old female presenting to the emergency department with shortness of breath.  Vital signs notable for borderline hypoxia between 91 and 94%, tachycardia.  No increased work of breathing.  Patient overall appears well.  Does have focal pulmonary sounds on exam.  Given repeat visit now after 2 courses of antibiotics with signs of persistent pneumonia on chest x-ray and physical exam as well as continued symptoms, will discuss with hospitalist for admission.  Patient is already been evaluated for pulmonary embolism.  Will treat with ceftriaxone, azithromycin IV.  No evidence of CHF exacerbation or other causes of shortness of breath such as pneumothorax.  Clinical Course as of 10/16/22 1918  Fri Oct 16, 2022  6644 Admitted to hospitalist [WS]    Clinical Course User Index [WS] Lonell Grandchild, MD     Additional history obtained: -External records from outside source obtained and reviewed including: Chart review including previous notes, labs, imaging, consultation notes including prior ER and UC notes for the same   Lab Tests: -I ordered, reviewed, and interpreted labs.   The pertinent results include:   Labs Reviewed  BASIC METABOLIC PANEL - Abnormal; Notable for the following components:      Result Value   Sodium 133 (*)    Potassium 3.3 (*)    Calcium 8.8 (*)    All other components within normal limits  CULTURE, BLOOD (ROUTINE X 2)  CULTURE, BLOOD (ROUTINE X 2)  RESPIRATORY PANEL BY PCR  SARS CORONAVIRUS 2 BY RT PCR  CBC  MAGNESIUM  RAPID URINE DRUG SCREEN, HOSP PERFORMED  BRAIN NATRIURETIC PEPTIDE  PROCALCITONIN  LEGIONELLA PNEUMOPHILA SEROGP 1 UR AG  STREP PNEUMONIAE URINARY ANTIGEN  BASIC METABOLIC PANEL  CBC  HIV ANTIBODY (ROUTINE TESTING W REFLEX)    Notable for no leukocytosis, mild hypokalemia  EKG   EKG Interpretation Date/Time:  Friday October 16 2022 18:11:27 EDT Ventricular Rate:   106 PR Interval:  148 QRS Duration:  85 QT Interval:  328 QTC Calculation: 436 R Axis:   102  Text Interpretation: Sinus tachycardia Borderline right axis deviation Nonspecific T abnormalities, anterior leads Confirmed by Alvino Blood ([id number]) on 10/16/2022 6:27:53 PM         Imaging Studies ordered: I ordered imaging studies including CXR On my interpretation imaging demonstrates pneumonia I independently visualized and interpreted imaging. I agree with the radiologist interpretation   Medicines ordered and prescription drug management: Meds ordered this encounter  Medications   cefTRIAXone (ROCEPHIN) 2 g in sodium chloride 0.9 % 100 mL IVPB    Order Specific Question:   Antibiotic Indication:    Answer:   CAP   azithromycin (ZITHROMAX) 500 mg in sodium chloride 0.9 % 250 mL IVPB    Order Specific Question:   Antibiotic Indication:    Answer:   CAP   sodium chloride 0.9 % bolus 1,000 mL   potassium chloride SA (KLOR-CON M) CR tablet 40 mEq   OR Linked Order Group    acetaminophen (TYLENOL) tablet 650 mg    acetaminophen (TYLENOL) suppository 650 mg   OR Linked Order Group    ondansetron (ZOFRAN) tablet 4 mg    ondansetron (ZOFRAN) injection 4 mg   albuterol (PROVENTIL) (2.5 MG/3ML) 0.083% nebulizer solution 2.5 mg   0.9 %  sodium chloride infusion    -I have reviewed the patients home medicines and have made adjustments as needed   Consultations Obtained: I requested consultation with the hospitalist,  and discussed lab and imaging findings as well as pertinent plan - they recommend: admission   Cardiac Monitoring: The patient was maintained on a cardiac monitor.  I personally viewed and interpreted the cardiac monitored which showed an underlying rhythm of: NSR  Social Determinants of Health:  Diagnosis or treatment significantly limited by social determinants of health: obesity   Reevaluation: After the interventions noted above, I reevaluated the patient  and found that their symptoms have improved  Co morbidities  that complicate the patient evaluation  Past Medical History:  Diagnosis Date   Angio-edema    Medical history non-contributory       Dispostion: Disposition decision including need for hospitalization was considered, and patient admitted to the hospital.    Final Clinical Impression(s) / ED Diagnoses Final diagnoses:  Community acquired pneumonia, unspecified laterality     This chart was dictated using voice recognition software.  Despite best efforts to proofread,  errors can occur which can change the documentation meaning.    Lonell Grandchild, MD 10/16/22 [phone]

## 2022-10-16 NOTE — ED Triage Notes (Signed)
Pt via POV c/o continued SOB, cough and body aches, and general unwell feeling after being dx with pneumonia last Saturday. She was prescribed a steroid plus azithromycin and doxycycline and has been taking meds as prescribed without improvement in condition.

## 2022-10-17 ENCOUNTER — Observation Stay (HOSPITAL_COMMUNITY): Payer: Medicaid Other

## 2022-10-17 ENCOUNTER — Other Ambulatory Visit (HOSPITAL_COMMUNITY): Payer: Self-pay | Admitting: *Deleted

## 2022-10-17 DIAGNOSIS — J189 Pneumonia, unspecified organism: Secondary | ICD-10-CM | POA: Diagnosis present

## 2022-10-17 DIAGNOSIS — Z6830 Body mass index (BMI) 30.0-30.9, adult: Secondary | ICD-10-CM | POA: Diagnosis not present

## 2022-10-17 DIAGNOSIS — Z1152 Encounter for screening for COVID-19: Secondary | ICD-10-CM | POA: Diagnosis not present

## 2022-10-17 DIAGNOSIS — Z825 Family history of asthma and other chronic lower respiratory diseases: Secondary | ICD-10-CM | POA: Diagnosis not present

## 2022-10-17 DIAGNOSIS — E669 Obesity, unspecified: Secondary | ICD-10-CM | POA: Diagnosis present

## 2022-10-17 DIAGNOSIS — R0902 Hypoxemia: Secondary | ICD-10-CM | POA: Diagnosis present

## 2022-10-17 DIAGNOSIS — R0609 Other forms of dyspnea: Secondary | ICD-10-CM

## 2022-10-17 DIAGNOSIS — E876 Hypokalemia: Secondary | ICD-10-CM | POA: Diagnosis present

## 2022-10-17 DIAGNOSIS — Z79899 Other long term (current) drug therapy: Secondary | ICD-10-CM | POA: Diagnosis not present

## 2022-10-17 DIAGNOSIS — Z853 Personal history of malignant neoplasm of breast: Secondary | ICD-10-CM | POA: Diagnosis not present

## 2022-10-17 LAB — BASIC METABOLIC PANEL
Anion gap: 8 (ref 5–15)
BUN: 8 mg/dL (ref 6–20)
CO2: 21 mmol/L — ABNORMAL LOW (ref 22–32)
Calcium: 7.8 mg/dL — ABNORMAL LOW (ref 8.9–10.3)
Chloride: 107 mmol/L (ref 98–111)
Creatinine, Ser: 0.55 mg/dL (ref 0.44–1.00)
GFR, Estimated: >60 mL/min (ref >60)
Glucose, Bld: 98 mg/dL (ref 70–99)
Potassium: 3.5 mmol/L (ref 3.5–5.1)
Sodium: 136 mmol/L (ref 135–145)

## 2022-10-17 LAB — CBC
HCT: 34.5 % — ABNORMAL LOW (ref 36.0–46.0)
Hemoglobin: 11.2 g/dL — ABNORMAL LOW (ref 12.0–15.0)
MCH: 28.4 pg (ref 26.0–34.0)
MCHC: 32.5 g/dL (ref 30.0–36.0)
MCV: 87.6 fL (ref 80.0–100.0)
Platelets: 205 10*3/uL (ref 150–400)
RBC: 3.94 MIL/uL (ref 3.87–5.11)
RDW: 14.1 % (ref 11.5–15.5)
WBC: 4 10*3/uL (ref 4.0–10.5)
nRBC: 0 % (ref 0.0–0.2)

## 2022-10-17 LAB — ECHOCARDIOGRAM COMPLETE
Area-P 1/2: 4.39 cm2
Height: 65 in
S' Lateral: 2.6 cm
Weight: 2960 oz

## 2022-10-17 LAB — CULTURE, BLOOD (ROUTINE X 2): Culture: NO GROWTH

## 2022-10-17 LAB — CK: Total CK: 48 U/L (ref 38–234)

## 2022-10-17 LAB — FERRITIN: Ferritin: 290 ng/mL (ref 11–307)

## 2022-10-17 LAB — STREP PNEUMONIAE URINARY ANTIGEN: Strep Pneumo Urinary Antigen: NEGATIVE

## 2022-10-17 LAB — SEDIMENTATION RATE: Sed Rate: 25 mm/hr — ABNORMAL HIGH (ref 0–22)

## 2022-10-17 LAB — LACTATE DEHYDROGENASE: LDH: 253 U/L — ABNORMAL HIGH (ref 98–192)

## 2022-10-17 LAB — C-REACTIVE PROTEIN: CRP: 0.6 mg/dL (ref <1.0)

## 2022-10-17 LAB — HIV ANTIBODY (ROUTINE TESTING W REFLEX): HIV Screen 4th Generation wRfx: NONREACTIVE

## 2022-10-17 NOTE — Progress Notes (Signed)
[facility] PROGRESS NOTE  Madison Decker (DOB: 1990-07-05) WJX:[id number] PCP: Pcp, No  Brief Narrative: Madison Decker is a 32 y.o. female with a history of angioedema/allergies who presented to the ED on 10/16/2022 with ongoing shortness of breath despite oral antibiotic course for pneumonia. Work up has included a CTA which showed stable multifocal infiltrates concerning for pneumonia. She was admitted for failure of outpatient management of pneumonia.  Subjective: Still short of breath but only on exertion, no real cough now or previously, no fevers or chills. A few months ago began having decreased appetite, unintentional weight loss, intermittent rashes on upper arms and thighs that were itchy improved with a cream, intermittent swelling and pain of fingers, no other joints, no myalgias, and a small symmetric set of hyperpigmented lesions lateral to the nose not involving the bridge. No FH of autoimmune disease. She has a strong history of allergies in the past.   Objective: BP 101/67 (BP Location: Left Arm)   Pulse 89   Temp 99.3 F (37.4 C)   Resp 20   Ht 5\' 5"  (1.651 m)   Wt 83.9 kg   SpO2 99%   BMI 30.79 kg/m   Gen: No distress Pulm: Crackles in most lung fields, no wheezes. Nonlabored tachypnea. CV: RRR, no MRG or pitting edema GI: Soft, NT, ND, +BS  Neuro: Alert and oriented. No new focal deficits. Ext: Warm, dry, mildly edematous, MCPs and PIPs bilaterally.  Skin: Superficial hyperpigmented hyperkeratotic confluent eruption on lateral upper arms, excoriation on left. Non pruritic, small symmetric hyperpigmented macules lateral to nose.   Assessment & Plan: Principal Problem:   Multifocal pneumonia Active Problems:   Hypokalemia   Dyspnea with cardiomegaly in setting of pneumonia  Dyspnea, multifocal pulmonary infiltrates with lymphadenopathy without improvement after antibiotic trial. WBC, PCT, BNP, respiratory panel, covid-19 PCR all negative. No PE on CTA.  Echocardiogram normal.  - In 32yo F w/systemic symptoms including rash, arthritis, weight loss a systemic inflammatory disorder is on differential. Her significant atopic history also may suggest an etiology alternative to bacterial pneumonia. - I appreciate pulmonary input regarding the differential diagnosis, autoimmune work up ordered.  - May initiate steroids, did feel a clinical response to this earlier this year, will follow work up. Fortunately, while the patient is short of breath and tachypneic, she is not in respiratory distress right now. - Will plan empiric antibiotic treatment with ceftriaxone. F/u cultures, urinary antigens.   Hypokalemia: Resolved.   Elevated AST: Unclear significance, very mild. Consider recheck at follow up.  Tyrone Nine, MD [facility] [email or web address] 10/17/2022, 4:48 PM

## 2022-10-17 NOTE — Consult Note (Signed)
NAME:  Madison Decker, MRN:  [id number], DOB:  Oct 31, 1990, LOS: 0 ADMISSION DATE:  10/16/2022, CONSULTATION DATE:  10/17/22 REFERRING MD:  Hazeline Junker, MD CHIEF COMPLAINT:  Dyspnea   History of Present Illness:   Madison Decker is a 32 year old woman, never smoker who was admitted for shortness of breath at [facility].  Pulmonary has been consulted for further evaluation.  She reports having intermittent swelling and joint pains of her hands over recent months and later developing shortness of breath more so with exertion.  She noticed the dyspnea while walking at work, she works in Engineering geologist at [employer].  Chest x-ray showed multifocal pneumonia and she was treated with azithromycin.  She was previously seen in an urgent care where she was given steroids and doxycycline with initial improvement but return of symptoms after completion of this treatment.   CT chest 6/29 shows numerous enlarged mediastinal and hilar lymph nodes and prominent bilateral axillary lymph nodes.  There is very extensive heterogeneous and consolidative airspace opacity throughout the lungs, greatest in the left lung and bilateral bases.  No Pleural effusions.  She returned to the ER 7/5 with shortness of breath, cough and body aches despite taking azithromycin.  She was started on IV ceftriaxone and reports feeling better today.  Other than her hands bilaterally she denies any other joint swelling or pain.  She denies any muscle aches.  She reports 20 to 30 pound weight loss over recent weeks.  She has decreased appetite.  No history of breathing issues.  Her mother and maternal grandmother have asthma.  She denies any smoking or vaping and no drug use.  No family history of autoimmune conditions.  Pertinent  Medical History   Past Medical History:  Diagnosis Date   Angio-edema    Medical history non-contributory    Significant Hospital Events: Including procedures, antibiotic start and stop dates in addition to  other pertinent events   7/5 admitted to [facility] 7/6 PCCM consult  Interim History / Subjective:  As above  Objective   Blood pressure 101/67, pulse 89, temperature 99.3 F (37.4 C), resp. rate 20, height 5\' 5"  (1.651 m), weight 83.9 kg, SpO2 99 %.        Intake/Output Summary (Last 24 hours) at 10/17/2022 1323 Last data filed at 10/17/2022 1001 Gross per 24 hour  Intake 875 ml  Output --  Net 875 ml   Filed Weights   10/16/22 1349  Weight: 83.9 kg    Examination: General: young woman, no acute distress, resting in bed HENT: [place]/AT, sclera anicteric Neuro: alert, oriented, moving all extremities   Assessment & Plan:   Multifocal Lung Infiltrates Bilateral Hand Swelling and Joint Pain  Discussion: Patient's presentation is concerning for possible inflammatory lung disease such as mixed connective tissue disease related ILD vs sarcoidosis vss infectious pneumonia.  Plan: -Check autoimmune panel including CK, aldolase, C4, C3 complement, CRP, ESR, CCP, rheumatoid factor, ANCA profile, ANA, ACE level and lysozyme level -Continue antibiotic coverage with ceftriaxone -Based on lab results we will consider initiating prednisone taper  PCCM will follow-up via tele consultation again tomorrow  Best Practice (right click and "Reselect all SmartList Selections" daily)   Per Primary Team  Labs   CBC: Recent Labs  Lab 10/10/22 1750 10/16/22 1423 10/17/22 0455  WBC 4.5 4.6 4.0  HGB 12.3 14.3 11.2*  HCT 37.7 43.7 34.5*  MCV 87.1 86.9 87.6  PLT 185 252 205    Basic Metabolic Panel:  Recent Labs  Lab 10/10/22 1750 10/16/22 1423 10/16/22 1807 10/17/22 0455  NA 134* 133*  --  136  K 3.0* 3.3*  --  3.5  CL 102 100  --  107  CO2 23 24  --  21*  GLUCOSE 89 97  --  98  BUN 8 11  --  8  CREATININE 0.62 0.71  --  0.55  CALCIUM 8.1* 8.8*  --  7.8*  MG  --   --  2.0  --    GFR: Estimated Creatinine Clearance: 110.1 mL/min (by C-G formula based on SCr of 0.55  mg/dL). Recent Labs  Lab 10/10/22 1750 10/16/22 1423 10/16/22 1807 10/17/22 0455  PROCALCITON  --   --  <0.10  --   WBC 4.5 4.6  --  4.0    Liver Function Tests: Recent Labs  Lab 10/16/22 1907  AST 46*  ALT 25  ALKPHOS 58  BILITOT 0.7  PROT 7.9  ALBUMIN 3.3*   No results for input(s): "LIPASE", "AMYLASE" in the last 168 hours. No results for input(s): "AMMONIA" in the last 168 hours.  ABG No results found for: "PHART", "PCO2ART", "PO2ART", "HCO3", "TCO2", "ACIDBASEDEF", "O2SAT"   Coagulation Profile: No results for input(s): "INR", "PROTIME" in the last 168 hours.  Cardiac Enzymes: No results for input(s): "CKTOTAL", "CKMB", "CKMBINDEX", "TROPONINI" in the last 168 hours.  HbA1C: No results found for: "HGBA1C"  CBG: No results for input(s): "GLUCAP" in the last 168 hours.  Review of Systems:   Review of Systems  Constitutional:  Positive for weight loss. Negative for chills, fever and malaise/fatigue.  HENT:  Negative for congestion, sinus pain and sore throat.   Eyes: Negative.   Respiratory:  Positive for cough and shortness of breath. Negative for hemoptysis, sputum production and wheezing.   Cardiovascular:  Negative for chest pain, palpitations, orthopnea, claudication and leg swelling.  Gastrointestinal:  Negative for abdominal pain, heartburn, nausea and vomiting.  Genitourinary: Negative.   Musculoskeletal:  Positive for joint pain. Negative for myalgias.  Skin:  Negative for rash.  Neurological:  Negative for weakness.  Endo/Heme/Allergies: Negative.   Psychiatric/Behavioral: Negative.     Past Medical History:  She,  has a past medical history of Angio-edema and Medical history non-contributory.   Surgical History:   Past Surgical History:  Procedure Laterality Date   ARTHROSCOPIC REPAIR ACL Right      Social History:   reports that she has never smoked. She has never used smokeless tobacco. She reports current alcohol use. She reports that  she does not use drugs.   Family History:  Her family history includes Asthma in her mother; Cancer in her maternal grandmother.   Allergies No Known Allergies   Home Medications  Prior to Admission medications   Medication Sig Start Date End Date Taking? Authorizing Provider  acetaminophen (TYLENOL) 325 MG tablet Take 2 tablets (650 mg total) by mouth every 4 (four) hours as needed (for pain scale < 4). 07/19/19   Venora Maples, MD  azithromycin (ZITHROMAX) 250 MG tablet Take 1 tablet (250 mg total) by mouth daily. Take first 2 tablets together, then 1 every day until finished. 10/10/22   Netta Corrigan, PA-C  cetirizine (ZYRTEC ALLERGY) 10 MG tablet Take 1 tablet (10 mg total) by mouth daily. 05/10/20   Alfonse Spruce, MD  doxycycline (VIBRAMYCIN) 100 MG capsule Take 1 capsule (100 mg total) by mouth 2 (two) times daily. 09/16/22   Valinda Hoar, NP  EPINEPHrine 0.3 mg/0.3 mL IJ SOAJ injection Inject 0.3 mg into the muscle as needed for anaphylaxis. 03/13/20   Novella Olive, FNP  predniSONE (DELTASONE) 20 MG tablet Take 2 tablets (40 mg total) by mouth daily. 09/16/22   White, Elita Boone, NP  triamcinolone cream (KENALOG) 0.1 % Apply 1 Application topically 2 (two) times daily. 10/10/22   Netta Corrigan, PA-C     Critical care time: n/a    Melody Comas, MD [facility] Office: [phone]   See Amion for personal pager PCCM on call pager [phone] until 7pm. Please call Elink 7p-7a. [phone]

## 2022-10-17 NOTE — Progress Notes (Signed)
*  PRELIMINARY RESULTS* Echocardiogram 2D Echocardiogram has been performed.  Stacey Drain 10/17/2022, 1:05 PM

## 2022-10-17 NOTE — TOC Initial Note (Signed)
Transition of Care [facility]) - Initial/Assessment Note    Patient Details  Name: Madison Decker MRN: [id number] Date of Birth: Dec 21, 1990  Transition of Care (TOC) CM/SW Contact:    Catalina Gravel, LCSW Phone Number: 10/17/2022, 5:45 PM  Clinical Narrative:                 Pt  here for PNA, echo concern. Continued medical work up.  TOC to follow.     Barriers to Discharge: Continued Medical Work up   Patient Goals and CMS Choice            Expected Discharge Plan and Services                                              Prior Living Arrangements/Services                       Activities of Daily Living Home Assistive Devices/Equipment: None ADL Screening (condition at time of admission) Patient's cognitive ability adequate to safely complete daily activities?: Yes Is the patient deaf or have difficulty hearing?: No Does the patient have difficulty seeing, even when wearing glasses/contacts?: No Does the patient have difficulty concentrating, remembering, or making decisions?: No Patient able to express need for assistance with ADLs?: Yes Does the patient have difficulty dressing or bathing?: No Independently performs ADLs?: Yes (appropriate for developmental age) Does the patient have difficulty walking or climbing stairs?: No Weakness of Legs: None Weakness of Arms/Hands: None  Permission Sought/Granted                  Emotional Assessment              Admission diagnosis:  Multifocal pneumonia [J18.9] Community acquired pneumonia, unspecified laterality [J18.9] Patient Active Problem List   Diagnosis Date Noted   Multifocal pneumonia 10/16/2022   Hypokalemia 10/16/2022   Dyspnea with cardiomegaly in setting of pneumonia 10/16/2022   Angio-edema 03/13/2020   Postpartum depression 12/06/2019   PCP:  Oneita Hurt, No Pharmacy:   [address] [facility] [address] [place] - [address] [place]  [address] Phone: [phone] Fax: [phone]     Social Determinants of Health (SDOH) Social History: SDOH Screenings   Food Insecurity: No Food Insecurity (01/27/2019)  Housing: Low Risk  (01/27/2019)  Transportation Needs: No Transportation Needs (01/27/2019)  Alcohol Screen: Low Risk  (01/27/2019)  Depression (PHQ2-9): Low Risk  (01/27/2019)  Financial Resource Strain: Low Risk  (01/27/2019)  Physical Activity: Inactive (01/27/2019)  Social Connections: Socially Isolated (01/27/2019)  Stress: No Stress Concern Present (01/27/2019)  Tobacco Use: Low Risk  (10/16/2022)   SDOH Interventions:     Readmission Risk Interventions     No data to display

## 2022-10-18 ENCOUNTER — Telehealth: Payer: Self-pay | Admitting: Pulmonary Disease

## 2022-10-18 DIAGNOSIS — J189 Pneumonia, unspecified organism: Secondary | ICD-10-CM | POA: Diagnosis not present

## 2022-10-18 LAB — CULTURE, BLOOD (ROUTINE X 2)

## 2022-10-18 LAB — ALDOLASE: Aldolase: 6.7 U/L (ref 3.3–10.3)

## 2022-10-18 LAB — RHEUMATOID FACTOR: Rheumatoid fact SerPl-aCnc: <10 IU/mL (ref <14.0)

## 2022-10-18 LAB — C4 COMPLEMENT: Complement C4, Body Fluid: 32 mg/dL (ref 12–38)

## 2022-10-18 LAB — C3 COMPLEMENT: C3 Complement: 97 mg/dL (ref 82–167)

## 2022-10-18 MED ORDER — CEFDINIR 300 MG PO CAPS: 300.0000 mg | ORAL_CAPSULE | Freq: Two times a day (BID) | ORAL | 0 refills | Status: AC

## 2022-10-18 MED ORDER — PREDNISONE 20 MG PO TABS: 30.0000 mg | ORAL_TABLET | Freq: Every day | ORAL | Status: DC

## 2022-10-18 MED ORDER — TRIAMCINOLONE ACETONIDE 0.5 % EX OINT
TOPICAL_OINTMENT | Freq: Two times a day (BID) | CUTANEOUS | Status: DC
  Filled 2022-10-18: qty 15

## 2022-10-18 MED ORDER — PREDNISONE 10 MG PO TABS: ORAL_TABLET | ORAL | 0 refills | Status: AC

## 2022-10-18 MED ORDER — TRIAMCINOLONE ACETONIDE 0.1 % EX CREA
TOPICAL_CREAM | Freq: Two times a day (BID) | CUTANEOUS | Status: DC
  Filled 2022-10-18: qty 15

## 2022-10-18 MED ORDER — PREDNISONE 10 MG PO TABS: 10.0000 mg | ORAL_TABLET | Freq: Every day | ORAL | Status: DC

## 2022-10-18 MED ORDER — PREDNISONE 20 MG PO TABS: 40.0000 mg | ORAL_TABLET | Freq: Every day | ORAL | Status: DC

## 2022-10-18 MED ORDER — PREDNISONE 10 MG PO TABS: 5.0000 mg | ORAL_TABLET | Freq: Every day | ORAL | Status: DC

## 2022-10-18 MED ORDER — AZITHROMYCIN 250 MG PO TABS: 250.0000 mg | ORAL_TABLET | Freq: Every day | ORAL | 0 refills | Status: AC

## 2022-10-18 MED ORDER — PREDNISONE 20 MG PO TABS: 20.0000 mg | ORAL_TABLET | Freq: Every day | ORAL | Status: DC

## 2022-10-18 NOTE — Discharge Summary (Signed)
Physician Discharge Summary   Patient: Madison Decker MRN: [id number] DOB: 1990-09-05  Admit date:     10/16/2022  Discharge date: {dischdate:26783}  Discharge Physician: Tyrone Nine   PCP: Pcp, No   Recommendations at discharge:  {Tip this will not be part of the note when signed- Example include specific recommendations for outpatient follow-up, pending tests to follow-up on. (Optional):26781}  ***  Discharge Diagnoses: Principal Problem:   Multifocal pneumonia Active Problems:   Hypokalemia   Dyspnea with cardiomegaly in setting of pneumonia  Resolved Problems:   * No resolved hospital problems. *  Hospital Course: No notes on file  Assessment and Plan: * Multifocal pneumonia 32 year old presenting with 1 month history of worsening shortness of breath, dyspnea on exertion and mild dry cough found to have multifocal pneumonia despite outpatient treatment with doxycycline and azithromycin  -obs to med-surg -BC obtained -check urinary antigens -covid/RVP and trend PCT -continue rocephin and azithromycin -albuterol Q6 x 2 -gentle IVF overnight -CTA chest on 6/29 showed negative PE, but very extensive heterogeneous and consolidative airspace opacity throughout the lungs, particularly of the left lung and bilatearl lung bases. Consistent with multifocal infection. Numerous enlarged mediastinal and hilar lymph nodes, likely reactive. Cardiomegaly -bnp/echo pending   Hypokalemia Check magnesium Repleted in ED Trend   Dyspnea with cardiomegaly in setting of pneumonia Has had some chest pain back in June with worsening dyspnea and cardiomegaly on CT Check echo       {Tip this will not be part of the note when signed Body mass index is 30.79 kg/m. , ,  (Optional):26781}  {(NOTE) Pain control PDMP Statment (Optional):26782} Consultants: *** Procedures performed: ***  Disposition: {Plan; Disposition:26390} Diet recommendation:  Discharge Diet Orders (From  admission, onward)     Start     Ordered   10/18/22 0000  Diet - low sodium heart healthy        10/18/22 1659           {Diet_Plan:26776} DISCHARGE MEDICATION: Allergies as of 10/18/2022   No Known Allergies      Medication List     STOP taking these medications    doxycycline 100 MG capsule Commonly known as: VIBRAMYCIN       TAKE these medications    azithromycin 250 MG tablet Commonly known as: ZITHROMAX Take 1 tablet (250 mg total) by mouth daily for 4 days. complete course Start taking on: October 19, 2022 What changed: additional instructions   cefdinir 300 MG capsule Commonly known as: OMNICEF Take 1 capsule (300 mg total) by mouth 2 (two) times daily for 4 days. Start taking on: October 19, 2022   EPINEPHrine 0.3 mg/0.3 mL Soaj injection Commonly known as: EPI-PEN Inject 0.3 mg into the muscle as needed for anaphylaxis.   predniSONE 10 MG tablet Commonly known as: DELTASONE Take 4 tablets (40 mg total) by mouth daily with breakfast for 3 days, THEN 3 tablets (30 mg total) daily with breakfast for 3 days, THEN 2 tablets (20 mg total) daily with breakfast for 3 days, THEN 1 tablet (10 mg total) daily with breakfast for 3 days, THEN 0.5 tablets (5 mg total) daily with breakfast for 3 days. Start taking on: October 19, 2022   triamcinolone cream 0.1 % Commonly known as: KENALOG Apply 1 Application topically 2 (two) times daily.        Follow-up Information     Martina Sinner, MD Follow up.   Specialty: Pulmonary Disease Contact information: [id number] [place] [address] [phone]                Discharge Exam: Ceasar Mons Weights   10/16/22 1349  Weight: 83.9 kg   ***  Condition at discharge: {DC Condition:26389}  The results of significant diagnostics from this hospitalization (including imaging, microbiology, ancillary and laboratory) are listed below for reference.   Imaging Studies: ECHOCARDIOGRAM  COMPLETE  Result Date: 10/17/2022    ECHOCARDIOGRAM REPORT   Patient Name:   Madison Decker Date of Exam: 10/17/2022 Medical Rec #:  [id number]        Height:       65.0 in Accession #:    [id number]       Weight:       185.0 lb Date of Birth:  1990/04/22         BSA:          1.914 m Patient Age:    32 years         BP:           101/67 mmHg Patient Gender: F                HR:           89 bpm. [facility] Procedure: 2D Echo, Cardiac Doppler and Color Doppler Indications:    Dyspnea R06.00  History:        Patient has no prior history of Echocardiogram examinations.                 Medical history non-contributory (From Hx).  Sonographer:    Celesta Gentile RCS Referring Phys: [id number] ALLISON WOLFE IMPRESSIONS  1. Left ventricular ejection fraction, by estimation, is 55 to 60%. The left ventricle has normal function. The left ventricle has no regional wall motion abnormalities. There is mild left ventricular hypertrophy. Left ventricular diastolic parameters were normal.  2. Right ventricular systolic function is normal. The right ventricular size is normal.  3. The mitral valve is normal in structure. No evidence of mitral valve regurgitation. No evidence of mitral stenosis.  4. The aortic valve is tricuspid. Aortic valve regurgitation is not visualized. No aortic stenosis is present.  5. The inferior vena cava is normal in size with greater than 50% respiratory variability, suggesting right atrial pressure of 3 mmHg. FINDINGS  Left Ventricle: Left ventricular ejection fraction, by estimation, is 55 to 60%. The left ventricle has normal function. The left ventricle has no regional wall motion abnormalities. The left ventricular internal cavity size was normal in size. There is  mild left ventricular hypertrophy. Left ventricular diastolic parameters were normal. Right Ventricle: The right ventricular size is normal. No increase in right ventricular wall thickness. Right ventricular systolic function  is normal. Left Atrium: Left atrial size was normal in size. Right Atrium: Right atrial size was normal in size. Pericardium: There is no evidence of pericardial effusion. Mitral Valve: The mitral valve is normal in structure. No evidence of mitral valve regurgitation. No evidence of mitral valve stenosis. Tricuspid Valve: The tricuspid valve is normal in structure. Tricuspid valve regurgitation is not demonstrated. No evidence of tricuspid stenosis. Aortic Valve: The aortic valve is tricuspid. Aortic valve regurgitation is not visualized. No aortic stenosis is present. Pulmonic Valve: The pulmonic valve was normal in structure. Pulmonic valve regurgitation is not visualized. No evidence of pulmonic stenosis. Aorta: The aortic root is normal in size and structure. Venous: The inferior vena cava is normal  in size with greater than 50% respiratory variability, suggesting right atrial pressure of 3 mmHg. IAS/Shunts: No atrial level shunt detected by color flow Doppler.  LEFT VENTRICLE PLAX 2D LVIDd:         4.40 cm   Diastology LVIDs:         2.60 cm   LV e' medial:    9.36 cm/s LV PW:         1.00 cm   LV E/e' medial:  6.6 LV IVS:        1.20 cm   LV e' lateral:   14.50 cm/s LVOT diam:     2.10 cm   LV E/e' lateral: 4.3 LV SV:         52 LV SV Index:   27 LVOT Area:     3.46 cm  RIGHT VENTRICLE RV S prime:     7.72 cm/s TAPSE (M-mode): 1.6 cm LEFT ATRIUM             Index        RIGHT ATRIUM           Index LA diam:        3.50 cm 1.83 cm/m   RA Area:     13.60 cm LA Vol (A2C):   50.4 ml 26.34 ml/m  RA Volume:   29.80 ml  15.57 ml/m LA Vol (A4C):   47.3 ml 24.72 ml/m LA Biplane Vol: 53.3 ml 27.85 ml/m  AORTIC VALVE LVOT Vmax:   86.30 cm/s LVOT Vmean:  59.700 cm/s LVOT VTI:    0.151 m  AORTA Ao Root diam: 3.10 cm MITRAL VALVE MV Area (PHT): 4.39 cm    SHUNTS MV Decel Time: 173 msec    Systemic VTI:  0.15 m MV E velocity: 61.80 cm/s  Systemic Diam: 2.10 cm MV A velocity: 36.40 cm/s MV E/A ratio:  1.70 Charlton Haws MD Electronically signed by Charlton Haws MD Signature Date/Time: 10/17/2022/1:11:28 PM    Final    DG Chest Portable 1 View  Result Date: 10/16/2022 CLINICAL DATA:  Cough. Continuous shortness of breath, cough, body aches. Diagnosed with pneumonia 6 days ago. Patient has been taking meds as prescribed. EXAM: PORTABLE CHEST 1 VIEW COMPARISON:  Chest radiographs 10/10/2022 and CTA chest 10/10/2022 FINDINGS: Cardiac silhouette is again mildly enlarged. Mediastinal contours are within normal limits. No significant change in patchy airspace opacities within the left mid to lower lung and right lower lung. This may be minimally improved, potentially due to improved overall air volumes on the current study. No pneumothorax. It is difficult to exclude a small left pleural effusion, however none was seen on recent CT. No acute skeletal abnormality. IMPRESSION: 1. No significant change in patchy airspace opacities within the left mid to lower lung and right lower lung. This may be minimally improved, but that may be secondary to improved overall air volumes on the current study. This is again suggestive of multifocal infection. 2. Mild cardiomegaly. Electronically Signed   By: Neita Garnet M.D.   On: 10/16/2022 14:47   CT Angio Chest PE W and/or Wo Contrast  Result Date: 10/10/2022 CLINICAL DATA:  PE suspected EXAM: CT ANGIOGRAPHY CHEST WITH CONTRAST TECHNIQUE: Multidetector CT imaging of the chest was performed using the standard protocol during bolus administration of intravenous contrast. Multiplanar CT image reconstructions and MIPs were obtained to evaluate the vascular anatomy. RADIATION DOSE REDUCTION: This exam was performed according to the departmental dose-optimization program which includes automated exposure control,  adjustment of the mA and/or kV according to patient size and/or use of iterative reconstruction technique. CONTRAST:  75mL OMNIPAQUE IOHEXOL 350 MG/ML SOLN COMPARISON:  None Available.  FINDINGS: Cardiovascular: Satisfactory opacification of the pulmonary arteries to the segmental level. No evidence of pulmonary embolism. Cardiomegaly no pericardial effusion. Mediastinum/Nodes: Numerous enlarged mediastinal and hilar lymph nodes, likely reactive. Prominent bilateral axillary lymph nodes. Thyroid gland, trachea, and esophagus demonstrate no significant findings. Lungs/Pleura: Very extensive heterogeneous and consolidative airspace opacity throughout the lungs, particularly of the left lung and bilateral lung bases (series 6, image 86). There may be some underlying component of scarring or fibrosis involving the peripheral left upper lobe (series 6, image 46). No pleural effusion or pneumothorax. Upper Abdomen: No acute abnormality. Musculoskeletal: No chest wall abnormality. No acute osseous findings. Review of the MIP images confirms the above findings. IMPRESSION: 1. Negative examination for pulmonary embolism. 2. Very extensive heterogeneous and consolidative airspace opacity throughout the lungs, particularly of the left lung and bilateral lung bases. There may be some underlying component of scarring or fibrosis involving the peripheral left upper lobe. Findings are most consistent with multifocal infection. 3. Numerous enlarged mediastinal and hilar lymph nodes, likely reactive. 4. Cardiomegaly. Electronically Signed   By: Jearld Lesch M.D.   On: 10/10/2022 20:21   DG Chest 2 View  Result Date: 10/10/2022 CLINICAL DATA:  Shortness of breath and tachycardia EXAM: CHEST - 2 VIEW COMPARISON:  None Available. FINDINGS: The heart size and mediastinal contours are within normal limits. Multifocal patchy and heterogeneous pulmonary opacities. No pleural effusion or pneumothorax. The visualized upper abdomen is unremarkable. No acute osseous abnormality. IMPRESSION: Multifocal patchy heterogeneous pulmonary opacities, concerning for infectious or inflammatory etiology. Electronically Signed   By:  Jacob Moores M.D.   On: 10/10/2022 19:00    Microbiology: Results for orders placed or performed during the hospital encounter of 10/16/22  Culture, blood (routine x 2)     Status: None (Preliminary result)   Collection Time: 10/16/22  6:07 PM   Specimen: BLOOD LEFT ARM  Result Value Ref Range Status   Specimen Description BLOOD LEFT ARM  Final   Special Requests   Final    BOTTLES DRAWN AEROBIC AND ANAEROBIC Blood Culture results may not be optimal due to an excessive volume of blood received in culture bottles   Culture   Final    NO GROWTH 2 DAYS Performed at [facility], [address], [place], [place] [address]    Report Status PENDING  Incomplete  Culture, blood (routine x 2)     Status: None (Preliminary result)   Collection Time: 10/16/22  6:15 PM   Specimen: Right Antecubital; Blood  Result Value Ref Range Status   Specimen Description RIGHT ANTECUBITAL  Final   Special Requests   Final    BOTTLES DRAWN AEROBIC AND ANAEROBIC Blood Culture results may not be optimal due to an excessive volume of blood received in culture bottles   Culture   Final    NO GROWTH 2 DAYS Performed at [facility], [address] [place], [place] [address]    Report Status PENDING  Incomplete  Respiratory (~20 pathogens) panel by PCR     Status: None   Collection Time: 10/16/22  6:41 PM   Specimen: Nasopharyngeal Swab; Respiratory  Result Value Ref Range Status   Adenovirus NOT DETECTED NOT DETECTED Final   Coronavirus 229E NOT DETECTED NOT DETECTED Final    Comment: (NOTE) The Coronavirus on the Respiratory Panel,  DOES NOT test for the novel  Coronavirus (2019 nCoV)    Coronavirus HKU1 NOT DETECTED NOT DETECTED Final   Coronavirus NL63 NOT DETECTED NOT DETECTED Final   Coronavirus OC43 NOT DETECTED NOT DETECTED Final   Metapneumovirus NOT DETECTED NOT DETECTED Final   Rhinovirus / Enterovirus NOT DETECTED NOT DETECTED Final   Influenza A NOT DETECTED NOT DETECTED Final   Influenza  B NOT DETECTED NOT DETECTED Final   Parainfluenza Virus 1 NOT DETECTED NOT DETECTED Final   Parainfluenza Virus 2 NOT DETECTED NOT DETECTED Final   Parainfluenza Virus 3 NOT DETECTED NOT DETECTED Final   Parainfluenza Virus 4 NOT DETECTED NOT DETECTED Final   Respiratory Syncytial Virus NOT DETECTED NOT DETECTED Final   Bordetella pertussis NOT DETECTED NOT DETECTED Final   Bordetella Parapertussis NOT DETECTED NOT DETECTED Final   Chlamydophila pneumoniae NOT DETECTED NOT DETECTED Final   Mycoplasma pneumoniae NOT DETECTED NOT DETECTED Final    Comment: Performed at [facility], [address], [place], [place] [address]  SARS Coronavirus 2 by RT PCR (hospital order, performed in [facility] hospital lab) *cepheid single result test* Anterior Nasal Swab     Status: None   Collection Time: 10/16/22  6:41 PM   Specimen: Anterior Nasal Swab  Result Value Ref Range Status   SARS Coronavirus 2 by RT PCR NEGATIVE NEGATIVE Final    Comment: (NOTE) SARS-CoV-2 target nucleic acids are NOT DETECTED.  The SARS-CoV-2 RNA is generally detectable in upper and lower respiratory specimens during the acute phase of infection. The lowest concentration of SARS-CoV-2 viral copies this assay can detect is 250 copies / mL. A negative result does not preclude SARS-CoV-2 infection and should not be used as the sole basis for treatment or other patient management decisions.  A negative result may occur with improper specimen collection / handling, submission of specimen other than nasopharyngeal swab, presence of viral mutation(s) within the areas targeted by this assay, and inadequate number of viral copies (<250 copies / mL). A negative result must be combined with clinical observations, patient history, and epidemiological information.  Fact Sheet for Patients:   RoadLapTop.co.za  Fact Sheet for Healthcare Providers: http://kim-miller.com/  This test  is not yet approved or  cleared by the [place] FDA and has been authorized for detection and/or diagnosis of SARS-CoV-2 by FDA under an Emergency Use Authorization (EUA).  This EUA will remain in effect (meaning this test can be used) for the duration of the COVID-19 declaration under Section 564(b)(1) of the Act, 21 U.S.C. section 360bbb-3(b)(1), unless the authorization is terminated or revoked sooner.  Performed at [facility], [address] [place], [place] [address]     Labs: CBC: Recent Labs  Lab 10/16/22 1423 10/17/22 0455  WBC 4.6 4.0  HGB 14.3 11.2*  HCT 43.7 34.5*  MCV 86.9 87.6  PLT 252 205   Basic Metabolic Panel: Recent Labs  Lab 10/16/22 1423 10/16/22 1807 10/17/22 0455  NA 133*  --  136  K 3.3*  --  3.5  CL 100  --  107  CO2 24  --  21*  GLUCOSE 97  --  98  BUN 11  --  8  CREATININE 0.71  --  0.55  CALCIUM 8.8*  --  7.8*  MG  --  2.0  --    Liver Function Tests: Recent Labs  Lab 10/16/22 1907  AST 46*  ALT 25  ALKPHOS 58  BILITOT 0.7  PROT  7.9  ALBUMIN 3.3*   CBG: No results for input(s): "GLUCAP" in the last 168 hours.  Discharge time spent: {LESS THAN/GREATER ZOXW:96045} 30 minutes.  Signed: Tyrone Nine, MD [facility] 10/18/2022

## 2022-10-18 NOTE — Telephone Encounter (Signed)
Please schedule patient for hospital follow up with me on 7/25 for pneumonia vs inflammatory lung disease.  Thanks, JD

## 2022-10-18 NOTE — Progress Notes (Signed)
[facility] PROGRESS NOTE  Madison Decker (DOB: 05/17/1990) ZOX:[id number] PCP: Pcp, No  Brief Narrative: Madison Decker is a 32 y.o. female with a history of angioedema/allergies who presented to the ED on 10/16/2022 with ongoing shortness of breath despite oral antibiotic course for pneumonia. Work up has included a CTA which showed stable multifocal infiltrates concerning for pneumonia. She was admitted for failure of outpatient management of pneumonia. Pulmonary consultation was obtained and autoimmune work up initiated. She is not hypoxemic and subjectively feeling improved on IV antibiotics.   Subjective: Feels better, would like to go home.  Objective: BP 95/61 (BP Location: Left Arm)   Pulse 95   Temp 99.2 F (37.3 C) (Oral)   Resp 16   Ht 5\' 5"  (1.651 m)   Wt 83.9 kg   SpO2 95%   BMI 30.79 kg/m   Gen: No distress Pulm: Crackles stable in most lung fields, no wheezes. Nonlabored tachypnea. CV: RRR, no edema GI: Soft, NT, ND, +BS  Neuro: Alert and oriented. No new focal deficits. Ext: Warm, dry, mildly edematous, MCPs and PIPs bilaterally. No significant tenderness when shaking hands. Skin: Superficial hyperpigmented hyperkeratotic confluent eruption on lateral upper arms, excoriation on left. Non pruritic, small symmetric hyperpigmented macules lateral to nose.   CRP: 0.6 ESR: 25 Ferritin 290 LDH 253 CK 48  PENDING: ANA ANCA profile RF Anti-CCP C3, C4 Aldolase Anti-scleroderma ACE Lysozyme Quantiferon  Assessment & Plan: Dyspnea, multifocal pulmonary infiltrates with lymphadenopathy without improvement after antibiotic trial. WBC, PCT, BNP, respiratory panel, covid-19 PCR all negative. No PE on CTA. Echocardiogram normal. In 32yo F w/systemic symptoms including rash, arthritis, weight loss a systemic inflammatory disorder is on differential. Her significant atopic history also may suggest an etiology alternative to bacterial pneumonia. - I appreciate  pulmonary input regarding the differential diagnosis, autoimmune work up ordered.  - May initiate steroids, did feel a clinical response to this earlier this year, will follow work up.  - Continuing empiric antibiotic treatment with ceftriaxone/azithromycin (started 7/5). F/u cultures (NGTD x2 days)   Hypokalemia: Resolved.   Elevated AST: Unclear significance, very mild. Consider recheck at follow up.  Tyrone Nine, MD [facility] [email or web address] 10/18/2022, 12:23 PM

## 2022-10-18 NOTE — Consult Note (Signed)
NAME:  Madison Decker, MRN:  [id number], DOB:  [birth year], LOS: 1 ADMISSION DATE:  10/16/2022, CONSULTATION DATE:  10/17/22 REFERRING MD:  Hazeline Junker, MD CHIEF COMPLAINT:  Dyspnea   History of Present Illness:   Madison Decker is a 32 year old woman, never smoker who was admitted for shortness of breath at [facility].  Pulmonary has been consulted for further evaluation.  She reports having intermittent swelling and joint pains of her hands over recent months and later developing shortness of breath more so with exertion.  She noticed the dyspnea while walking at work, she works in Engineering geologist at [employer].  Chest x-ray showed multifocal pneumonia and she was treated with azithromycin.  She was previously seen in an urgent care where she was given steroids and doxycycline with initial improvement but return of symptoms after completion of this treatment.   CT chest 6/29 shows numerous enlarged mediastinal and hilar lymph nodes and prominent bilateral axillary lymph nodes.  There is very extensive heterogeneous and consolidative airspace opacity throughout the lungs, greatest in the left lung and bilateral bases.  No Pleural effusions.  She returned to the ER 7/5 with shortness of breath, cough and body aches despite taking azithromycin.  She was started on IV ceftriaxone and reports feeling better today.  Other than her hands bilaterally she denies any other joint swelling or pain.  She denies any muscle aches.  She reports 20 to 30 pound weight loss over recent weeks.  She has decreased appetite.  No history of breathing issues.  Her mother and maternal grandmother have asthma.  She denies any smoking or vaping and no drug use.  No family history of autoimmune conditions.  Pertinent  Medical History   Past Medical History:  Diagnosis Date   Angio-edema    Medical history non-contributory    Significant Hospital Events: Including procedures, antibiotic start and stop dates in addition to  other pertinent events   7/5 admitted to [facility] 7/6 PCCM consult  Interim History / Subjective:  As above  Objective   Blood pressure 96/75, pulse 84, temperature 98.4 F (36.9 C), temperature source Oral, resp. rate 17, height 5\' 5"  (1.651 m), weight 83.9 kg, SpO2 98 %.        Intake/Output Summary (Last 24 hours) at 10/18/2022 1622 Last data filed at 10/18/2022 4098 Gross per 24 hour  Intake 1069.84 ml  Output --  Net 1069.84 ml    Filed Weights   10/16/22 1349  Weight: 83.9 kg    Examination: General: young woman, no acute distress, resting in bed HENT: [place]/AT, sclera anicteric Neuro: alert, oriented, moving all extremities  Labs: LDH 253 CRP 0.6 CK 48 ESR 25   Assessment & Plan:   Multifocal Lung Infiltrates Bilateral Hand Swelling and Joint Pain  Discussion: Patient's presentation is concerning for possible inflammatory lung disease such as mixed connective tissue disease related ILD vs sarcoidosis vss infectious pneumonia.  Plan: -Check autoimmune panel including CK, aldolase, C4, C3 complement, CRP, ESR, CCP, rheumatoid factor, ANCA profile, ANA, ACE level and lysozyme level - ESR elevated. Will follow up remain labs as outpatient -Continue antibiotic coverage with cefpodoxime and azithromycin PO - Plan for prednisone taper 40mg  x 3 days, 30mg  x 3 days, 20mg  x 3 days, 10mg  x 3 days, 5mg  x 3 days -Will arrange for outpatient follow up in our pulmonary clinic  Patient safe for discharge from pulmonary standpoint.  Best Practice (right click and "Reselect all SmartList Selections" daily)  Per Primary Team  Labs   CBC: Recent Labs  Lab 10/16/22 1423 10/17/22 0455  WBC 4.6 4.0  HGB 14.3 11.2*  HCT 43.7 34.5*  MCV 86.9 87.6  PLT 252 205     Basic Metabolic Panel: Recent Labs  Lab 10/16/22 1423 10/16/22 1807 10/17/22 0455  NA 133*  --  136  K 3.3*  --  3.5  CL 100  --  107  CO2 24  --  21*  GLUCOSE 97  --  98  BUN 11  --  8   CREATININE 0.71  --  0.55  CALCIUM 8.8*  --  7.8*  MG  --  2.0  --     GFR: Estimated Creatinine Clearance: 110.1 mL/min (by C-G formula based on SCr of 0.55 mg/dL). Recent Labs  Lab 10/16/22 1423 10/16/22 1807 10/17/22 0455  PROCALCITON  --  <0.10  --   WBC 4.6  --  4.0     Liver Function Tests: Recent Labs  Lab 10/16/22 1907  AST 46*  ALT 25  ALKPHOS 58  BILITOT 0.7  PROT 7.9  ALBUMIN 3.3*    No results for input(s): "LIPASE", "AMYLASE" in the last 168 hours. No results for input(s): "AMMONIA" in the last 168 hours.  ABG No results found for: "PHART", "PCO2ART", "PO2ART", "HCO3", "TCO2", "ACIDBASEDEF", "O2SAT"   Coagulation Profile: No results for input(s): "INR", "PROTIME" in the last 168 hours.  Cardiac Enzymes: Recent Labs  Lab 10/17/22 1442  CKTOTAL 48    HbA1C: No results found for: "HGBA1C"  CBG: No results for input(s): "GLUCAP" in the last 168 hours.    Critical care time: n/a    Melody Comas, MD [facility] Office: [phone]   See Amion for personal pager PCCM on call pager [phone] until 7pm. Please call Elink 7p-7a. [phone]

## 2022-10-19 LAB — ANTI-SCLERODERMA ANTIBODY: Scleroderma (Scl-70) (ENA) Antibody, IgG: <0.2 AI (ref 0.0–0.9)

## 2022-10-19 LAB — LEGIONELLA PNEUMOPHILA SEROGP 1 UR AG: L. pneumophila Serogp 1 Ur Ag: NEGATIVE

## 2022-10-19 LAB — CYCLIC CITRUL PEPTIDE ANTIBODY, IGG/IGA: CCP Antibodies IgG/IgA: 5 units (ref 0–19)

## 2022-10-20 LAB — CULTURE, BLOOD (ROUTINE X 2)

## 2022-10-20 LAB — ANGIOTENSIN CONVERTING ENZYME: Angiotensin-Converting Enzyme: 44 U/L (ref 14–82)

## 2022-10-20 LAB — ANCA PROFILE
Anti-MPO Antibodies: <0.2 units (ref 0.0–0.9)
Anti-PR3 Antibodies: <0.2 units (ref 0.0–0.9)
Atypical P-ANCA titer: <1:20 {titer}
C-ANCA: <1:20 {titer}
P-ANCA: <1:20 {titer}

## 2022-10-20 LAB — ANTINUCLEAR ANTIBODIES, IFA: ANA Ab, IFA: NEGATIVE

## 2022-10-20 NOTE — Telephone Encounter (Signed)
Patient is scheduled 7/25 at 11:30am with Dr. Francine Graven-  nothing further needed. I will mail out an appointment reminder.

## 2022-10-21 LAB — CULTURE, BLOOD (ROUTINE X 2): Culture: NO GROWTH

## 2022-10-21 LAB — LYSOZYME, SERUM: Lysozyme: 4.1 ug/mL (ref 3.3–7.1)

## 2022-10-22 LAB — QUANTIFERON-TB GOLD PLUS: QuantiFERON-TB Gold Plus: UNDETERMINED — AB

## 2022-10-22 LAB — QUANTIFERON-TB GOLD PLUS (RQFGPL)
QuantiFERON Mitogen Value: 0.46 IU/mL
QuantiFERON Nil Value: 0.03 IU/mL
QuantiFERON TB1 Ag Value: 0.02 IU/mL
QuantiFERON TB2 Ag Value: 0.02 IU/mL

## 2022-11-05 ENCOUNTER — Ambulatory Visit: Payer: Medicaid Other | Admitting: Pulmonary Disease

## 2022-11-05 ENCOUNTER — Encounter: Payer: Self-pay | Admitting: Pulmonary Disease

## 2022-11-05 ENCOUNTER — Ambulatory Visit (INDEPENDENT_AMBULATORY_CARE_PROVIDER_SITE_OTHER): Payer: Medicaid Other

## 2022-11-05 VITALS — BP 110/70 | HR 103 | Temp 98.0°F | Ht 65.0 in | Wt 179.2 lb

## 2022-11-05 DIAGNOSIS — J189 Pneumonia, unspecified organism: Secondary | ICD-10-CM

## 2022-11-05 NOTE — Progress Notes (Signed)
Synopsis: Referred in July 2024 for shortness of breath - hospital follow up  Subjective:   PATIENT ID: Madison Decker GENDER: female DOB: 1991-02-12, MRN: [id number]  HPI  Chief Complaint  Patient presents with   Hospitalization Follow-up    Breathing has improved some but not back to baseline quite yet. She still gets winded with walking at a fast pace or light housework. Change in temperate makes her feel tired.    Madison Decker is a 32 year old woman, never smoker who was admitted for shortness of breath at [facility] and returns to pulmonary clinic for follow up.    She reports having intermittent swelling and joint pains of her hands over recent months and later developing shortness of breath more so with exertion.  She noticed the dyspnea while walking at work, she works in Engineering geologist at [employer].  Chest x-ray showed multifocal pneumonia and she was treated with azithromycin.  She was previously seen in an urgent care where she was given steroids and doxycycline with initial improvement but return of symptoms after completion of this treatment.    CT chest 6/29 shows numerous enlarged mediastinal and hilar lymph nodes and prominent bilateral axillary lymph nodes.  There is very extensive heterogeneous and consolidative airspace opacity throughout the lungs, greatest in the left lung and bilateral bases.  No Pleural effusions.   She returned to the ER 7/5 with shortness of breath, cough and body aches despite taking azithromycin.  She was started on IV ceftriaxone and reports feeling better today.   Other than her hands bilaterally she denies any other joint swelling or pain.  She denies any muscle aches.  She reports 20 to 30 pound weight loss over recent weeks.  She has decreased appetite.   No history of breathing issues.  Her mother and maternal grandmother have asthma.  She denies any smoking or vaping and no drug use.  No family history of autoimmune conditions.  Today  11/05/22 She has slowly been feeling better since her hospital admission.  She will be completing steroid therapy today.  Typically she has bilateral hand joint pain/stiffness which has been better on steroids.  She also reports bilateral hand swelling which is improved as well.  She denies any fevers, chills or sweats.  She continues to have fatigue and some exertional dyspnea.  The fatigue and dyspnea seem to slowly be improving  We reviewed her laboratory workup from the hospital which was notable for elevated LDH and ESR.  Otherwise autoimmune panel was negative.  Past Medical History:  Diagnosis Date   Angio-edema    Medical history non-contributory      Family History  Problem Relation Age of Onset   Cancer Maternal Grandmother        breast   Asthma Mother      Social History   Socioeconomic History   Marital status: Single    Spouse name: Not on file   Number of children: Not on file   Years of education: Not on file   Highest education level: Some college, no degree  Occupational History   Not on file  Tobacco Use   Smoking status: Never   Smokeless tobacco: Never  Vaping Use   Vaping status: Former   Devices: tried using vape- less than 1 year of use stopped 04-13-21  Substance and Sexual Activity   Alcohol use: Yes    Comment: Socially   Drug use: No   Sexual activity: Yes  Birth control/protection: None  Other Topics Concern   Not on file  Social History Narrative   Not on file   Social Determinants of Health   Financial Resource Strain: Low Risk  (01/27/2019)   Overall Financial Resource Strain (CARDIA)    Difficulty of Paying Living Expenses: Not hard at all  Food Insecurity: No Food Insecurity (01/27/2019)   Hunger Vital Sign    Worried About Running Out of Food in the Last Year: Never true    Ran Out of Food in the Last Year: Never true  Transportation Needs: No Transportation Needs (01/27/2019)   PRAPARE - Scientist, research (physical sciences) (Medical): No    Lack of Transportation (Non-Medical): No  Physical Activity: Inactive (01/27/2019)   Exercise Vital Sign    Days of Exercise per Week: 0 days    Minutes of Exercise per Session: 0 min  Stress: No Stress Concern Present (01/27/2019)   [employer] of Occupational Health - Occupational Stress Questionnaire    Feeling of Stress : Not at all  Social Connections: Socially Isolated (01/27/2019)   Social Connection and Isolation Panel [NHANES]    Frequency of Communication with Friends and Family: Three times a week    Frequency of Social Gatherings with Friends and Family: More than three times a week    Attends Religious Services: Never    Database administrator or Organizations: No    Attends Banker Meetings: Never    Marital Status: Never married  Intimate Partner Violence: Not At Risk (01/27/2019)   Humiliation, Afraid, Rape, and Kick questionnaire    Fear of Current or Ex-Partner: No    Emotionally Abused: No    Physically Abused: No    Sexually Abused: No     No Known Allergies   Outpatient Medications Prior to Visit  Medication Sig Dispense Refill   EPINEPHrine 0.3 mg/0.3 mL IJ SOAJ injection Inject 0.3 mg into the muscle as needed for anaphylaxis. 1 each 1   triamcinolone cream (KENALOG) 0.1 % Apply 1 Application topically 2 (two) times daily. 30 g 0   No facility-administered medications prior to visit.    Review of Systems  Constitutional:  Positive for malaise/fatigue. Negative for chills, fever and weight loss.  HENT:  Negative for congestion, sinus pain and sore throat.   Eyes: Negative.   Respiratory:  Positive for shortness of breath. Negative for cough, hemoptysis, sputum production and wheezing.   Cardiovascular:  Negative for chest pain, palpitations, orthopnea, claudication and leg swelling.  Gastrointestinal:  Negative for abdominal pain, heartburn, nausea and vomiting.  Genitourinary: Negative.    Musculoskeletal:  Negative for joint pain and myalgias.  Skin:  Negative for rash.  Neurological:  Negative for weakness.  Endo/Heme/Allergies: Negative.   Psychiatric/Behavioral: Negative.        Objective:   Vitals:   11/05/22 1123  BP: 110/70  Pulse: (!) 103  Temp: 98 F (36.7 C)  TempSrc: Oral  SpO2: 98%  Weight: 179 lb 3.2 oz (81.3 kg)  Height: 5\' 5"  (1.651 m)     Physical Exam Constitutional:      General: She is not in acute distress.    Appearance: She is not ill-appearing.  HENT:     Head: Normocephalic and atraumatic.  Eyes:     General: No scleral icterus.    Conjunctiva/sclera: Conjunctivae normal.  Cardiovascular:     Rate and Rhythm: Normal rate and regular rhythm.     Pulses: Normal  pulses.     Heart sounds: Normal heart sounds. No murmur heard. Pulmonary:     Effort: Pulmonary effort is normal.     Breath sounds: Normal breath sounds. No wheezing, rhonchi or rales.  Musculoskeletal:     Right lower leg: No edema.     Left lower leg: No edema.  Skin:    General: Skin is warm and dry.  Neurological:     General: No focal deficit present.     Mental Status: She is alert.       CBC    Component Value Date/Time   WBC 4.0 10/17/2022 0455   RBC 3.94 10/17/2022 0455   HGB 11.2 (L) 10/17/2022 0455   HGB 12.3 05/08/2019 0848   HCT 34.5 (L) 10/17/2022 0455   HCT 36.6 05/08/2019 0848   PLT 205 10/17/2022 0455   PLT 285 05/08/2019 0848   MCV 87.6 10/17/2022 0455   MCV 89 05/08/2019 0848   MCH 28.4 10/17/2022 0455   MCHC 32.5 10/17/2022 0455   RDW 14.1 10/17/2022 0455   RDW 13.6 05/08/2019 0848   LYMPHSABS 1.2 01/27/2019 1153   EOSABS 0.0 01/27/2019 1153   BASOSABS 0.0 01/27/2019 1153      Latest Ref Rng & Units 10/17/2022    4:55 AM 10/16/2022    2:23 PM 10/10/2022    5:50 PM  BMP  Glucose 70 - 99 mg/dL 98  97  89   BUN 6 - 20 mg/dL 8  11  8    Creatinine 0.44 - 1.00 mg/dL 5.78  4.69  6.29   Sodium 135 - 145 mmol/L 136  133  134    Potassium 3.5 - 5.1 mmol/L 3.5  3.3  3.0   Chloride 98 - 111 mmol/L 107  100  102   CO2 22 - 32 mmol/L 21  24  23    Calcium 8.9 - 10.3 mg/dL 7.8  8.8  8.1    Chest imaging: CXR 10/16/22 1. No significant change in patchy airspace opacities within the left mid to lower lung and right lower lung. This may be minimally improved, but that may be secondary to improved overall air volumes on the current study. This is again suggestive of multifocal infection. 2. Mild cardiomegaly.  CTA Chest 10/10/22 1. Negative examination for pulmonary embolism. 2. Very extensive heterogeneous and consolidative airspace opacity throughout the lungs, particularly of the left lung and bilateral lung bases. There may be some underlying component of scarring or fibrosis involving the peripheral left upper lobe. Findings are most consistent with multifocal infection. 3. Numerous enlarged mediastinal and hilar lymph nodes, likely reactive. 4. Cardiomegaly.  CXR 10/10/22 Multifocal patchy heterogeneous pulmonary opacities, concerning for infectious or inflammatory etiology.   PFT:     No data to display          Labs: Negative ANA, CCP, RF, ANCA, C3/C4, ScL-70, CRP ESR 25 ACE 44 LDH 253 Lysozyme 4.1  Path:  Echo:  Heart Catheterization:     Assessment & Plan:   Multifocal pneumonia - Plan: DG Chest 2 View  Discussion: Madison Decker is a 32 year old woman, never smoker who was admitted for shortness of breath at [facility] and returns to pulmonary clinic for follow up of multifocal pneumonia.   Overall she has noted improvement with prolonged steroid taper which she will be completing today.  She denies any further cough, sputum production, fevers, chills or night sweats.  Inflammatory lab workup was unremarkable except for elevation in  LDH and ESR markers.  Radiograph today shows overall improvement in the bilateral infiltrates, but the infiltrates remain  nonetheless.  We will monitor her closely given that there is suspicion for possible inflammatory lung disease involvement rather than infectious multifocal pneumonia.  Follow-up in 1 month via video visit and we will determine further chest imaging or workup at that time.  Melody Comas, MD [facility] Office: [phone]   Current Outpatient Medications:    EPINEPHrine 0.3 mg/0.3 mL IJ SOAJ injection, Inject 0.3 mg into the muscle as needed for anaphylaxis., Disp: 1 each, Rfl: 1   triamcinolone cream (KENALOG) 0.1 %, Apply 1 Application topically 2 (two) times daily., Disp: 30 g, Rfl: 0

## 2022-11-05 NOTE — Patient Instructions (Addendum)
We will continue to monitor your fatigue and shortness of breath.   Recommend starting a daily walking regimen, 15-30 minutes per day, slow easy pace.  We will check a chest x-ray today.   Follow up in 1 month via video visit

## 2022-11-08 ENCOUNTER — Encounter: Payer: Self-pay | Admitting: Pulmonary Disease

## 2022-11-10 ENCOUNTER — Ambulatory Visit
Admission: RE | Admit: 2022-11-10 | Discharge: 2022-11-10 | Disposition: A | Discharge status: Home / Self Care | Payer: Medicaid Other | Source: Ambulatory Visit | Attending: Nurse Practitioner | Admitting: Nurse Practitioner

## 2022-11-10 ENCOUNTER — Encounter: Payer: Self-pay | Admitting: Pulmonary Disease

## 2022-11-10 VITALS — BP 112/75 | HR 111 | Temp 98.5°F | Resp 18

## 2022-11-10 DIAGNOSIS — R200 Anesthesia of skin: Secondary | ICD-10-CM

## 2022-11-10 DIAGNOSIS — M79643 Pain in unspecified hand: Secondary | ICD-10-CM

## 2022-11-10 DIAGNOSIS — R202 Paresthesia of skin: Secondary | ICD-10-CM

## 2022-11-10 DIAGNOSIS — M7989 Other specified soft tissue disorders: Secondary | ICD-10-CM | POA: Diagnosis not present

## 2022-11-10 MED ORDER — PREDNISONE 20 MG PO TABS: 40.0000 mg | ORAL_TABLET | Freq: Every day | ORAL | 0 refills | Status: AC

## 2022-11-10 MED ORDER — DEXAMETHASONE SODIUM PHOSPHATE 10 MG/ML IJ SOLN
10.0000 mg | INTRAMUSCULAR | Status: AC
  Administered 2022-11-10: 10 mg via INTRAMUSCULAR

## 2022-11-10 NOTE — Discharge Instructions (Signed)
You were given an injection of Decadron 10 mg.  Do not start the prednisone until 11/11/2022. May take over-the-counter Tylenol as needed for pain or discomfort. Recommend immediate follow-up with a primary care physician for further evaluation.  Please make every effort to attend the appointment that was scheduled for you. Follow-up as needed.

## 2022-11-10 NOTE — ED Triage Notes (Signed)
Tingling and numbness and swelling in both hands since Saturday.  History of pneumonia.  States she was on prednisone for 20 days.  states pulmonologist told her to be seen if hands started to swell.

## 2022-11-10 NOTE — ED Provider Notes (Signed)
[facility]    CSN: [id number] Arrival date & time: 11/10/22  1748      History   Chief Complaint Chief Complaint  Patient presents with   Hand Problem    My hands are swollen,tingling, and in pain! I was recently on a 20 day supply of prednisone for pneumonia and my pulmonary dr told me to come to urgent care! - Entered by patient    HPI Madison Decker is a 32 y.o. female.   The history is provided by the patient.   Patient presents for complaints of swelling, numbness, and tingling in her hands.  Patient states symptoms have been going on for the past several months.  She states that the episodes come and go.  Patient denies injury or trauma.  She states that her hands are painful, and she has difficulty with things such as gripping objects, and removing caps.  Patient states that she was recently admitted to the hospital for pneumonia.  She states she was on prednisone for approximately 20 days.  She states while she was on the prednisone, she did not experience any symptoms regarding her hands.  She states that she has been off the prednisone for approximately 5 days, and developed symptoms approximately 2 to 3 days ago.  Patient was seen in this clinic in March, and presented with the same or similar symptoms.  At that time, patient was also treated with a short course of prednisone.  Patient states symptoms improved when she was taking the prednisone, but later returned.  Patient states she is currently seeing pulmonology for follow-up for her pneumonia.  She states that the pulmonologist told her that the pneumonia is improving but she still does have some remaining inflammation in her lungs.  Patient denies fever, chills, injury or trauma to her hands, or bruising.  Patient states she is trying to find a primary care physician. Past Medical History:  Diagnosis Date   Angio-edema    Medical history non-contributory     Patient Active Problem List   Diagnosis Date  Noted   Multifocal pneumonia 10/16/2022   Hypokalemia 10/16/2022   Dyspnea with cardiomegaly in setting of pneumonia 10/16/2022   Angio-edema 03/13/2020   Postpartum depression 12/06/2019    Past Surgical History:  Procedure Laterality Date   ARTHROSCOPIC REPAIR ACL Right     OB History     Gravida  1   Para  1   Term  1   Preterm      AB      Living  1      SAB      IAB      Ectopic      Multiple  0   Live Births  1            Home Medications    Prior to Admission medications   Medication Sig Start Date End Date Taking? Authorizing Provider  EPINEPHrine 0.3 mg/0.3 mL IJ SOAJ injection Inject 0.3 mg into the muscle as needed for anaphylaxis. 03/13/20   Novella Olive, FNP  triamcinolone cream (KENALOG) 0.1 % Apply 1 Application topically 2 (two) times daily. 10/10/22   Netta Corrigan, PA-C    Family History Family History  Problem Relation Age of Onset   Cancer Maternal Grandmother        breast   Asthma Mother     Social History Social History   Tobacco Use   Smoking status: Never   Smokeless tobacco:  Never  Vaping Use   Vaping status: Former   Devices: tried using vape- less than 1 year of use stopped 04-13-21  Substance Use Topics   Alcohol use: Yes    Comment: Socially   Drug use: No     Allergies   Patient has no known allergies.   Review of Systems Review of Systems Per HPI  Physical Exam Triage Vital Signs ED Triage Vitals  Encounter Vitals Group     BP 11/10/22 1807 112/75     Systolic BP Percentile --      Diastolic BP Percentile --      Pulse Rate 11/10/22 1807 (!) 111     Resp 11/10/22 1807 18     Temp 11/10/22 1807 98.5 F (36.9 C)     Temp Source 11/10/22 1807 Oral     SpO2 11/10/22 1807 94 %     Weight --      Height --      Head Circumference --      Peak Flow --      Pain Score 11/10/22 1809 8     Pain Loc --      Pain Education --      Exclude from Growth Chart --    No data found.  Updated  Vital Signs BP 112/75 (BP Location: Right Arm)   Pulse (!) 111   Temp 98.5 F (36.9 C) (Oral)   Resp 18   LMP 10/31/2022 (Exact Date)   SpO2 94%   Visual Acuity Right Eye Distance:   Left Eye Distance:   Bilateral Distance:    Right Eye Near:   Left Eye Near:    Bilateral Near:     Physical Exam Vitals and nursing note reviewed.  Constitutional:      General: She is not in acute distress.    Appearance: Normal appearance.  HENT:     Head: Normocephalic.  Eyes:     Extraocular Movements: Extraocular movements intact.     Pupils: Pupils are equal, round, and reactive to light.  Cardiovascular:     Rate and Rhythm: Normal rate and regular rhythm.     Pulses: Normal pulses.     Heart sounds: Normal heart sounds.  Pulmonary:     Effort: Pulmonary effort is normal. No respiratory distress.     Breath sounds: Normal breath sounds. No stridor. No wheezing, rhonchi or rales.  Abdominal:     General: Bowel sounds are normal.     Palpations: Abdomen is soft.  Musculoskeletal:     Right hand: Swelling present. No deformity. Normal range of motion. Decreased strength (d/t swelling). Normal capillary refill. Normal pulse.     Left hand: Swelling present. No deformity. Normal range of motion. Decreased strength (d/t swelling). Normal capillary refill. Normal pulse.     Cervical back: Normal range of motion.  Lymphadenopathy:     Cervical: No cervical adenopathy.  Skin:    General: Skin is warm and dry.  Neurological:     General: No focal deficit present.     Mental Status: She is alert and oriented to person, place, and time.  Psychiatric:        Mood and Affect: Mood normal.        Behavior: Behavior normal.      UC Treatments / Results  Labs (all labs ordered are listed, but only abnormal results are displayed) Labs Reviewed - No data to display  EKG   Radiology No results found.  Procedures Procedures (including critical care time)  Medications Ordered in  UC Medications  dexamethasone (DECADRON) injection 10 mg (10 mg Intramuscular Given 11/10/22 1850)    Initial Impression / Assessment and Plan / UC Course  I have reviewed the triage vital signs and the nursing notes.  Pertinent labs & imaging results that were available during my care of the patient were reviewed by me and considered in my medical decision making (see chart for details).  The patient is well-appearing, she is in no acute distress, she is tachycardic, vital signs are otherwise stable.  Patient presents for ongoing swelling, numbness, and tingling of her bilateral hands.  Patient was seen in this clinic in March for the same or similar symptoms.  Patient recently treated for pneumonia with prednisone, notes that symptoms improved when she was prescribed prednisone from this clinic for her appointment in March and while she was taking the prednisone for the pneumonia.  Symptoms most likely being caused by an inflammatory trigger, difficult to ascertain as patient most likely will need additional lab work.  Of note, while patient was in the hospital for pneumonia earlier this month, sed rate was 25, which was slightly elevated.  Decadron 10 mg IM given in the clinic, will start patient on prednisone 40 mg for the next 5 days for symptomatic treatment.  Patient was given supportive care recommendations to include over-the-counter Tylenol for pain or discomfort.  Patient was also assisted in scheduling appointment with her primary care physician prior to leaving this appointment for follow-up.  Patient was in agreement with this plan of care and verbalizes understanding.  All questions were answered.  Patient stable for discharge.   Final Clinical Impressions(s) / UC Diagnoses   Final diagnoses:  Intermittent pain and swelling of hand  Numbness and tingling in both hands     Discharge Instructions      You were given an injection of Decadron 10 mg.  Do not start the prednisone  until 11/11/2022. May take over-the-counter Tylenol as needed for pain or discomfort. Recommend immediate follow-up with a primary care physician for further evaluation.  Please make every effort to attend the appointment that was scheduled for you. Follow-up as needed.     ED Prescriptions   None    PDMP not reviewed this encounter.   Abran Cantor, NP 11/10/22 1858

## 2022-11-23 ENCOUNTER — Encounter: Payer: Self-pay | Admitting: Pulmonary Disease

## 2022-11-30 ENCOUNTER — Telehealth: Payer: Self-pay | Admitting: Pulmonary Disease

## 2022-11-30 DIAGNOSIS — J189 Pneumonia, unspecified organism: Secondary | ICD-10-CM

## 2022-11-30 DIAGNOSIS — M25541 Pain in joints of right hand: Secondary | ICD-10-CM

## 2022-11-30 NOTE — Telephone Encounter (Signed)
Patient with on going intermittent hand swelling bilaterally and pain/discomfort that is relieved by prednisone courses. We will place referral to rheumatology for further evaluation.   I will go ahead and move up HRCT chest imaging and place order now for further workup of inflammatory lung disease.   Melody Comas, MD [facility] Office: [phone]   See Amion for personal pager PCCM on call pager [phone] until 7pm. Please call Elink 7p-7a. [phone]

## 2022-12-07 ENCOUNTER — Encounter: Payer: Self-pay | Admitting: Family Medicine

## 2022-12-07 ENCOUNTER — Ambulatory Visit: Payer: Medicaid Other | Admitting: Family Medicine

## 2022-12-07 VITALS — BP 128/87 | HR 99 | Ht 65.0 in | Wt 166.0 lb

## 2022-12-07 DIAGNOSIS — R21 Rash and other nonspecific skin eruption: Secondary | ICD-10-CM

## 2022-12-07 DIAGNOSIS — G6289 Other specified polyneuropathies: Secondary | ICD-10-CM

## 2022-12-07 DIAGNOSIS — D509 Iron deficiency anemia, unspecified: Secondary | ICD-10-CM

## 2022-12-07 DIAGNOSIS — Z13220 Encounter for screening for lipoid disorders: Secondary | ICD-10-CM

## 2022-12-07 DIAGNOSIS — R079 Chest pain, unspecified: Secondary | ICD-10-CM

## 2022-12-07 DIAGNOSIS — Z131 Encounter for screening for diabetes mellitus: Secondary | ICD-10-CM

## 2022-12-07 DIAGNOSIS — Z0182 Encounter for allergy testing: Secondary | ICD-10-CM

## 2022-12-07 DIAGNOSIS — G629 Polyneuropathy, unspecified: Secondary | ICD-10-CM | POA: Insufficient documentation

## 2022-12-07 DIAGNOSIS — R0602 Shortness of breath: Secondary | ICD-10-CM | POA: Diagnosis not present

## 2022-12-07 DIAGNOSIS — Z1329 Encounter for screening for other suspected endocrine disorder: Secondary | ICD-10-CM

## 2022-12-07 DIAGNOSIS — E538 Deficiency of other specified B group vitamins: Secondary | ICD-10-CM | POA: Diagnosis not present

## 2022-12-07 DIAGNOSIS — E559 Vitamin D deficiency, unspecified: Secondary | ICD-10-CM | POA: Diagnosis not present

## 2022-12-07 DIAGNOSIS — R000 Tachycardia, unspecified: Secondary | ICD-10-CM

## 2022-12-07 MED ORDER — GABAPENTIN 100 MG PO CAPS: 100.0000 mg | ORAL_CAPSULE | Freq: Three times a day (TID) | ORAL | 3 refills | Status: AC

## 2022-12-07 MED ORDER — TRIAMCINOLONE ACETONIDE 0.1 % EX CREA: 1.0000 | TOPICAL_CREAM | Freq: Two times a day (BID) | CUTANEOUS | 3 refills | Status: DC

## 2022-12-07 NOTE — Patient Instructions (Signed)

## 2022-12-07 NOTE — Addendum Note (Signed)
Addended by: Rica Records on: 12/07/2022 12:52 PM   Modules accepted: Orders

## 2022-12-07 NOTE — Progress Notes (Addendum)
New Patient Office Visit   Subjective   Patient ID: Madison Decker, female    DOB: 16-Dec-1990  Age: 32 y.o. MRN: [id number]  CC:  Chief Complaint  Patient presents with   Establish Care    New Patient looking to establish care w/ new physician does have an appointment with rheumatology for pain and cyanosis in fingers. Needs to be scheduled for yearly Pap smear, and would like to schedule for flu vaccination.    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  Trouble breathing when walking and trying to catch breath when performing basic activities. Gets shortness of breath and dizzy when she is trying to perform household or personal hygiene tasks.    Rash    Rash on arms and back. Rash continues to return when not on steroids.    Edema    Mostly in hands some in feet. Swelling happens mostly in the morning. And late at night    Numbness    Numbness in fingers continuously.    low appitite.     Loss of appetite for the last two months     HPI Madison Decker 32 year old female, presents to establish care. She  has a past medical history of Angio-edema and Medical history non-contributory.  Rash This is a chronic problem. The current episode started 3 months and has been gradually worsening since onset. The rash is diffuse. The rash is characterized by itchiness, dryness, redness and scaling. It is unknown if there was an exposure to a precipitant. Associated symptoms include fatigue, joint pain and shortness of breath. Pertinent negatives include no fever. Past treatments include OTC anti-itch cream. The treatment provided no relief. Her past medical history is significant for allergies and asthma.   Hand Pain  There was no injury mechanism. The pain is present in the right hand and left hand. The quality of the pain is described as aching numbness, pins and needles sensation, burning,  swelling,intermittent cyanosis, patient reports can not perform fine motor skills such as grasping, pinching. The pain is at a severity of 10/10. The pain has been constant since the incident. Associated symptoms include chest pain, muscle weakness, numbness and tingling. Nothing aggravates the symptoms. She has tried acetaminophen for the symptoms. The treatment provided no relief.        Outpatient Encounter Medications as of 12/07/2022  Medication Sig   EPINEPHrine 0.3 mg/0.3 mL IJ SOAJ injection Inject 0.3 mg into the muscle as needed for anaphylaxis.   gabapentin (NEURONTIN) 100 MG capsule Take 1 capsule (100 mg total) by mouth 3 (three) times daily.   [DISCONTINUED] triamcinolone cream (KENALOG) 0.1 % Apply 1 Application topically 2 (two) times daily.   triamcinolone cream (KENALOG) 0.1 % Apply 1 Application topically 2 (two) times daily.   No facility-administered encounter medications on file as of 12/07/2022.    Past Surgical History:  Procedure Laterality Date   ARTHROSCOPIC REPAIR ACL Right     Review of Systems  Constitutional:  Negative for chills and fever.  Eyes:  Negative for blurred vision.  Respiratory:  Positive for shortness of breath.   Cardiovascular:  Positive for chest pain.       EKG ordered  Gastrointestinal:  Negative for abdominal pain.  Genitourinary:  Negative for dysuria.  Musculoskeletal:  Positive for joint pain and myalgias.  Skin:  Positive for itching and  rash.  Neurological:  Negative for dizziness and headaches.  Psychiatric/Behavioral:  The patient is nervous/anxious.       Objective    BP 128/87 (BP Location: Right Arm, Patient Position: Sitting, Cuff Size: Normal)   Pulse 99   Ht 5\' 5"  (1.651 m)   Wt 166 lb (75.3 kg)   LMP 10/31/2022 (Exact Date)   SpO2 92%   BMI 27.62 kg/m   Physical Exam Vitals reviewed.  Constitutional:      General: She is not in acute distress.    Appearance: Normal appearance. She is not ill-appearing,  toxic-appearing or diaphoretic.  HENT:     Head: Normocephalic.     Right Ear: Tympanic membrane normal.     Mouth/Throat:     Mouth: Mucous membranes are moist.  Eyes:     General:        Right eye: No discharge.        Left eye: No discharge.     Conjunctiva/sclera: Conjunctivae normal.     Pupils: Pupils are equal, round, and reactive to light.  Cardiovascular:     Rate and Rhythm: Normal rate.     Pulses: Normal pulses.     Heart sounds: Normal heart sounds.  Pulmonary:     Effort: Pulmonary effort is normal. No respiratory distress.     Breath sounds: Normal breath sounds.  Abdominal:     General: Bowel sounds are normal.     Palpations: Abdomen is soft.     Tenderness: There is no abdominal tenderness. There is no right CVA tenderness, left CVA tenderness or guarding.  Musculoskeletal:        General: Normal range of motion.     Cervical back: Normal range of motion.  Skin:    Capillary Refill: Capillary refill takes less than 2 seconds.     Coloration: Skin is not pale.     Findings: Lesion and rash present. No bruising or erythema.     Comments: Diffuse rash located on patient hip, upper back, arms, neck, and legs. Flat medium size oval and squared shape non- erythematous, dry patches  white to gray skin beds. No swelling or redness noted.  Neurological:     General: No focal deficit present.     Mental Status: She is alert.     Motor: No weakness.     Coordination: Coordination normal.     Gait: Gait normal.  Psychiatric:        Mood and Affect: Mood normal.       Assessment & Plan:  Screening for thyroid disorder -     TSH + free T4 -     Thyroid Peroxidase Antibodies (TPO) (REFL) -     Thyroid stimulating immunoglobulin -     Thyroid peroxidase antibody  Screening for diabetes mellitus -     Microalbumin / creatinine urine ratio -     Hemoglobin A1c  Screening for lipid disorders -     Lipid panel -     CMP14+EGFR -     CBC with  Differential/Platelet  Vitamin B 12 deficiency -     B12 and Folate Panel  Vitamin D deficiency -     VITAMIN D 25 Hydroxy (Vit-D Deficiency, Fractures)  SOB (shortness of breath) -     Brain natriuretic peptide  Rash and nonspecific skin eruption Assessment & Plan: Diffuse rash started 3 months ago and now worsening. Rash unknown Etiology   Referral to Dermatology and Allergy testing for  second opinion  Advise patient to follow up with  Rheumatology on  04/2023 Kenalog cream 0.1% for itching relief  Advise patient to apply cold , wet cloth or ice pack to the skin that itches, wear loose-fitting , cotton clothing, use fragrance-free lotions, soaps, and detergents to minimize irritation.   Orders: -     Ambulatory referral to Dermatology  Encounter for allergy testing -     Ambulatory referral to Allergy  Iron deficiency anemia, unspecified iron deficiency anemia type -     Iron, TIBC and Ferritin Panel  Chest pain, unspecified type -     EKG 12-Lead  Other polyneuropathy Assessment & Plan: Unknown Etiology Labs ordered, BMP, Hemoglobin A1c, iron panel, Vit B12 , Vit D. Trial on gabapentin 100 mg 3 times daily for pain management. Referral to Neurology for second opinion   Orders: -     Ambulatory referral to Neurology  Sinus tachycardia -     Ambulatory referral to Cardiology  Other orders -     Gabapentin; Take 1 capsule (100 mg total) by mouth 3 (three) times daily.  Dispense: 90 capsule; Refill: 3 -     Triamcinolone Acetonide; Apply 1 Application topically 2 (two) times daily.  Dispense: 80 g; Refill: 3    Return in about 6 weeks (around 01/18/2023), or Next week virtual visit for Anxiety, for Pap smear, Anxiety.   Cruzita Lederer Newman Nip, FNP

## 2022-12-07 NOTE — Assessment & Plan Note (Addendum)
Diffuse rash started 3 months ago and now worsening. Rash unknown Etiology   Referral to Dermatology and Allergy testing for second opinion  Advise patient to follow up with  Rheumatology on  04/2023 Kenalog cream 0.1% for itching relief  Advise patient to apply cold , wet cloth or ice pack to the skin that itches, wear loose-fitting , cotton clothing, use fragrance-free lotions, soaps, and detergents to minimize irritation.

## 2022-12-07 NOTE — Assessment & Plan Note (Signed)
Unknown Etiology Labs ordered, BMP, Hemoglobin A1c, iron panel, Vit B12 , Vit D. Trial on gabapentin 100 mg 3 times daily for pain management. Referral to Neurology for second opinion

## 2022-12-09 LAB — LIPID PANEL
Chol/HDL Ratio: 2.9 ratio (ref 0.0–4.4)
Cholesterol, Total: 134 mg/dL (ref 100–199)
HDL: 46 mg/dL (ref >39)
LDL Chol Calc (NIH): 68 mg/dL (ref 0–99)
Triglycerides: 112 mg/dL (ref 0–149)
VLDL Cholesterol Cal: 20 mg/dL (ref 5–40)

## 2022-12-09 LAB — CBC WITH DIFFERENTIAL/PLATELET
Basophils Absolute: 0 10*3/uL (ref 0.0–0.2)
Basos: 0 %
EOS (ABSOLUTE): 0 10*3/uL (ref 0.0–0.4)
Eos: 0 %
Hematocrit: 44.3 % (ref 34.0–46.6)
Hemoglobin: 14 g/dL (ref 11.1–15.9)
Immature Grans (Abs): 0 10*3/uL (ref 0.0–0.1)
Immature Granulocytes: 0 %
Lymphocytes Absolute: 0.7 10*3/uL (ref 0.7–3.1)
Lymphs: 11 %
MCH: 28.2 pg (ref 26.6–33.0)
MCHC: 31.6 g/dL (ref 31.5–35.7)
MCV: 89 fL (ref 79–97)
Monocytes Absolute: 1 10*3/uL — ABNORMAL HIGH (ref 0.1–0.9)
Monocytes: 17 %
Neutrophils Absolute: 4.3 10*3/uL (ref 1.4–7.0)
Neutrophils: 72 %
Platelets: 314 10*3/uL (ref 150–450)
RBC: 4.96 x10E6/uL (ref 3.77–5.28)
RDW: 14.4 % (ref 11.7–15.4)
WBC: 6 10*3/uL (ref 3.4–10.8)

## 2022-12-09 LAB — MICROALBUMIN / CREATININE URINE RATIO
Creatinine, Urine: 270.4 mg/dL
Microalb/Creat Ratio: 6 mg/g creat (ref 0–29)
Microalbumin, Urine: 15.5 ug/mL

## 2022-12-09 LAB — VITAMIN D 25 HYDROXY (VIT D DEFICIENCY, FRACTURES): Vit D, 25-Hydroxy: 13.1 ng/mL — ABNORMAL LOW (ref 30.0–100.0)

## 2022-12-09 LAB — TSH+FREE T4
Free T4: 1.04 ng/dL (ref 0.82–1.77)
TSH: 1.75 u[IU]/mL (ref 0.450–4.500)

## 2022-12-09 LAB — CMP14+EGFR
ALT: 13 IU/L (ref 0–32)
AST: 46 IU/L — ABNORMAL HIGH (ref 0–40)
Albumin: 3.6 g/dL — ABNORMAL LOW (ref 4.0–5.0)
Alkaline Phosphatase: 72 IU/L (ref 44–121)
BUN/Creatinine Ratio: 11 (ref 9–23)
BUN: 7 mg/dL (ref 6–20)
Bilirubin Total: 0.5 mg/dL (ref 0.0–1.2)
CO2: 24 mmol/L (ref 20–29)
Calcium: 8.9 mg/dL (ref 8.7–10.2)
Chloride: 101 mmol/L (ref 96–106)
Creatinine, Ser: 0.62 mg/dL (ref 0.57–1.00)
Globulin, Total: 3.8 g/dL (ref 1.5–4.5)
Glucose: 90 mg/dL (ref 70–99)
Potassium: 3.4 mmol/L — ABNORMAL LOW (ref 3.5–5.2)
Sodium: 139 mmol/L (ref 134–144)
Total Protein: 7.4 g/dL (ref 6.0–8.5)
eGFR: 123 mL/min/{1.73_m2} (ref >59)

## 2022-12-09 LAB — THYROID STIMULATING IMMUNOGLOBULIN: Thyroid Stim Immunoglobulin: <0.1 IU/L (ref 0.00–0.55)

## 2022-12-09 LAB — B12 AND FOLATE PANEL
Folate: 8.7 ng/mL (ref >3.0)
Vitamin B-12: 397 pg/mL (ref 232–1245)

## 2022-12-09 LAB — HEMOGLOBIN A1C
Est. average glucose Bld gHb Est-mCnc: 128 mg/dL
Hgb A1c MFr Bld: 6.1 % — ABNORMAL HIGH (ref 4.8–5.6)

## 2022-12-09 LAB — IRON,TIBC AND FERRITIN PANEL
Ferritin: 643 ng/mL — ABNORMAL HIGH (ref 15–150)
Iron Saturation: 14 % — ABNORMAL LOW (ref 15–55)
Iron: 36 ug/dL (ref 27–159)
Total Iron Binding Capacity: 258 ug/dL (ref 250–450)
UIBC: 222 ug/dL (ref 131–425)

## 2022-12-09 LAB — BRAIN NATRIURETIC PEPTIDE: BNP: 4.1 pg/mL (ref 0.0–100.0)

## 2022-12-10 ENCOUNTER — Encounter (HOSPITAL_COMMUNITY): Payer: Self-pay | Admitting: *Deleted

## 2022-12-10 ENCOUNTER — Emergency Department (HOSPITAL_COMMUNITY): Payer: Medicaid Other

## 2022-12-10 ENCOUNTER — Inpatient Hospital Stay (HOSPITAL_COMMUNITY)
Admission: EM | Admit: 2022-12-10 | Discharge: 2022-12-17 | DRG: 193 | Disposition: A | Discharge status: Home / Self Care | Payer: Medicaid Other | Attending: Internal Medicine | Admitting: Internal Medicine

## 2022-12-10 ENCOUNTER — Other Ambulatory Visit: Payer: Self-pay

## 2022-12-10 DIAGNOSIS — J9621 Acute and chronic respiratory failure with hypoxia: Secondary | ICD-10-CM | POA: Diagnosis present

## 2022-12-10 DIAGNOSIS — T380X5A Adverse effect of glucocorticoids and synthetic analogues, initial encounter: Secondary | ICD-10-CM | POA: Diagnosis present

## 2022-12-10 DIAGNOSIS — Z91018 Allergy to other foods: Secondary | ICD-10-CM | POA: Diagnosis not present

## 2022-12-10 DIAGNOSIS — E8809 Other disorders of plasma-protein metabolism, not elsewhere classified: Secondary | ICD-10-CM | POA: Diagnosis present

## 2022-12-10 DIAGNOSIS — R591 Generalized enlarged lymph nodes: Secondary | ICD-10-CM | POA: Diagnosis not present

## 2022-12-10 DIAGNOSIS — L989 Disorder of the skin and subcutaneous tissue, unspecified: Secondary | ICD-10-CM | POA: Diagnosis present

## 2022-12-10 DIAGNOSIS — E876 Hypokalemia: Secondary | ICD-10-CM | POA: Diagnosis present

## 2022-12-10 DIAGNOSIS — Z794 Long term (current) use of insulin: Secondary | ICD-10-CM

## 2022-12-10 DIAGNOSIS — Z7952 Long term (current) use of systemic steroids: Secondary | ICD-10-CM

## 2022-12-10 DIAGNOSIS — E1165 Type 2 diabetes mellitus with hyperglycemia: Secondary | ICD-10-CM | POA: Diagnosis not present

## 2022-12-10 DIAGNOSIS — R000 Tachycardia, unspecified: Secondary | ICD-10-CM | POA: Diagnosis present

## 2022-12-10 DIAGNOSIS — J9601 Acute respiratory failure with hypoxia: Secondary | ICD-10-CM | POA: Diagnosis present

## 2022-12-10 DIAGNOSIS — J189 Pneumonia, unspecified organism: Secondary | ICD-10-CM | POA: Diagnosis present

## 2022-12-10 DIAGNOSIS — Z1152 Encounter for screening for COVID-19: Secondary | ICD-10-CM

## 2022-12-10 DIAGNOSIS — Z825 Family history of asthma and other chronic lower respiratory diseases: Secondary | ICD-10-CM

## 2022-12-10 DIAGNOSIS — Z79899 Other long term (current) drug therapy: Secondary | ICD-10-CM | POA: Diagnosis not present

## 2022-12-10 DIAGNOSIS — J849 Interstitial pulmonary disease, unspecified: Secondary | ICD-10-CM | POA: Diagnosis not present

## 2022-12-10 DIAGNOSIS — E1140 Type 2 diabetes mellitus with diabetic neuropathy, unspecified: Secondary | ICD-10-CM | POA: Diagnosis present

## 2022-12-10 DIAGNOSIS — K219 Gastro-esophageal reflux disease without esophagitis: Secondary | ICD-10-CM | POA: Diagnosis present

## 2022-12-10 DIAGNOSIS — J9691 Respiratory failure, unspecified with hypoxia: Principal | ICD-10-CM

## 2022-12-10 DIAGNOSIS — Z23 Encounter for immunization: Secondary | ICD-10-CM | POA: Diagnosis not present

## 2022-12-10 DIAGNOSIS — E871 Hypo-osmolality and hyponatremia: Secondary | ICD-10-CM | POA: Diagnosis present

## 2022-12-10 LAB — BLOOD GAS, VENOUS
Acid-Base Excess: 6.1 mmol/L — ABNORMAL HIGH (ref 0.0–2.0)
Bicarbonate: 31.2 mmol/L — ABNORMAL HIGH (ref 20.0–28.0)
Drawn by: 53361
O2 Saturation: 17.6 %
Patient temperature: 37.2
pCO2, Ven: 46 mmHg (ref 44–60)
pH, Ven: 7.44 — ABNORMAL HIGH (ref 7.25–7.43)
pO2, Ven: <31 mmHg — CL (ref 32–45)

## 2022-12-10 LAB — CBC WITH DIFFERENTIAL/PLATELET
Abs Immature Granulocytes: 0.03 10*3/uL (ref 0.00–0.07)
Basophils Absolute: 0 10*3/uL (ref 0.0–0.1)
Basophils Relative: 1 %
Eosinophils Absolute: 0.1 10*3/uL (ref 0.0–0.5)
Eosinophils Relative: 1 %
HCT: 42.7 % (ref 36.0–46.0)
Hemoglobin: 13.5 g/dL (ref 12.0–15.0)
Immature Granulocytes: 1 %
Lymphocytes Relative: 23 %
Lymphs Abs: 1.3 10*3/uL (ref 0.7–4.0)
MCH: 28.4 pg (ref 26.0–34.0)
MCHC: 31.6 g/dL (ref 30.0–36.0)
MCV: 89.7 fL (ref 80.0–100.0)
Monocytes Absolute: 1.2 10*3/uL — ABNORMAL HIGH (ref 0.1–1.0)
Monocytes Relative: 22 %
Neutro Abs: 2.9 10*3/uL (ref 1.7–7.7)
Neutrophils Relative %: 52 %
Platelets: 318 10*3/uL (ref 150–400)
RBC: 4.76 MIL/uL (ref 3.87–5.11)
RDW: 15 % (ref 11.5–15.5)
WBC: 5.5 10*3/uL (ref 4.0–10.5)
nRBC: 0 % (ref 0.0–0.2)

## 2022-12-10 LAB — COMPREHENSIVE METABOLIC PANEL
ALT: 18 U/L (ref 0–44)
AST: 50 U/L — ABNORMAL HIGH (ref 15–41)
Albumin: 3 g/dL — ABNORMAL LOW (ref 3.5–5.0)
Alkaline Phosphatase: 51 U/L (ref 38–126)
Anion gap: 9 (ref 5–15)
BUN: 8 mg/dL (ref 6–20)
CO2: 28 mmol/L (ref 22–32)
Calcium: 8.1 mg/dL — ABNORMAL LOW (ref 8.9–10.3)
Chloride: 97 mmol/L — ABNORMAL LOW (ref 98–111)
Creatinine, Ser: 0.64 mg/dL (ref 0.44–1.00)
GFR, Estimated: >60 mL/min (ref >60)
Glucose, Bld: 97 mg/dL (ref 70–99)
Potassium: 2.9 mmol/L — ABNORMAL LOW (ref 3.5–5.1)
Sodium: 134 mmol/L — ABNORMAL LOW (ref 135–145)
Total Bilirubin: 0.8 mg/dL (ref 0.3–1.2)
Total Protein: 8 g/dL (ref 6.5–8.1)

## 2022-12-10 LAB — PROTIME-INR
INR: 1 (ref 0.8–1.2)
Prothrombin Time: 13.8 seconds (ref 11.4–15.2)

## 2022-12-10 LAB — RESP PANEL BY RT-PCR (RSV, FLU A&B, COVID)  RVPGX2
Influenza A by PCR: NEGATIVE
Influenza B by PCR: NEGATIVE
Resp Syncytial Virus by PCR: NEGATIVE
SARS Coronavirus 2 by RT PCR: NEGATIVE

## 2022-12-10 LAB — LACTIC ACID, PLASMA
Lactic Acid, Venous: 1.2 mmol/L (ref 0.5–1.9)
Lactic Acid, Venous: 1.1 mmol/L (ref 0.5–1.9)

## 2022-12-10 LAB — APTT: aPTT: 29 seconds (ref 24–36)

## 2022-12-10 MED ORDER — LACTATED RINGERS IV SOLN: INTRAVENOUS | Status: DC

## 2022-12-10 MED ORDER — SODIUM CHLORIDE 0.9 % IV SOLN
2.0000 g | INTRAVENOUS | Status: AC
  Administered 2022-12-10 – 2022-12-14 (×5): 2 g via INTRAVENOUS
  Filled 2022-12-10 (×5): qty 20

## 2022-12-10 MED ORDER — SODIUM CHLORIDE 0.9 % IV SOLN
500.0000 mg | INTRAVENOUS | Status: AC
  Administered 2022-12-10 – 2022-12-14 (×5): 500 mg via INTRAVENOUS
  Filled 2022-12-10 (×6): qty 5

## 2022-12-10 MED ORDER — IOHEXOL 350 MG/ML SOLN
75.0000 mL | Freq: Once | INTRAVENOUS | Status: AC | PRN
  Administered 2022-12-10: 75 mL via INTRAVENOUS

## 2022-12-10 NOTE — ED Triage Notes (Signed)
Pt arrived with EMS for cough x 3 days with bodyaches. Reports having pneumonia in July and completed antibiotics at that time. EMS reported vitals pulse 130, bp 117/79, oxygen 97%. Pt appears to feel bad, warm to the touch. Denies fevers at home, however feels hot when coughing.  Initial triage vitals showed pulse ox reading at 73%. Pt calm however reports feeling difficulty breathing. Placed pt on 2L [place] with increased of oxygen to 82%. Oxygen increased again to 6L, sats at 87%. John PA did initial MSE in triage. Lung sounds diminished on R side, clear on the left. Was seen at primary care on Monday for same,

## 2022-12-10 NOTE — ED Provider Notes (Signed)
[place] EMERGENCY DEPARTMENT AT [facility] Provider Note   CSN: [id number] Arrival date & time: 12/10/22  1934     History  Chief Complaint  Patient presents with   Cough   HPI Madison Decker is a 32 y.o. female with recent history of pneumonia in July presenting for 3 days of cough and bodyaches.  Had pneumonia in July completed antibiotics at that time.  States that about an hour ago the coughing was much worse and he started cough of white phlegm and started to have trouble breathing.  EMS was called and vitals were reported to be pulse of 130 oxygen 97% and blood pressure 117/79.  Patient denies fever at home although she states that she does "feel hot".  Upon arrival to the ED her O2 sat was 73% she was placed on 2 L of nasal cannula and improved to 82%.  Increased again to 6 L and improved to 87%.  She also mentioned that she has had swelling in her hands and feet and being evaluated for this by her PCP.  Denies OCP use, calf tenderness, recent immobilization.   Cough      Home Medications Prior to Admission medications   Medication Sig Start Date End Date Taking? Authorizing Provider  cetirizine (ZYRTEC) 5 MG tablet Take 5 mg by mouth at bedtime.   Yes [provider]  EPINEPHrine 0.3 mg/0.3 mL IJ SOAJ injection Inject 0.3 mg into the muscle as needed for anaphylaxis. 03/13/20  Yes Novella Olive, FNP  Multiple Vitamins-Minerals (WOMENS MULTIVITAMIN) TABS Take 1 tablet by mouth daily.   Yes [provider]  triamcinolone cream (KENALOG) 0.1 % Apply 1 Application topically 2 (two) times daily. 12/07/22  Yes Del Newman Nip, Tenna Child, FNP  gabapentin (NEURONTIN) 100 MG capsule Take 1 capsule (100 mg total) by mouth 3 (three) times daily. Patient not taking: Reported on 12/10/2022 12/07/22   Rica Records, FNP      Allergies    Patient has no known allergies.    Review of Systems   Review of Systems  Respiratory:  Positive for cough.      Physical Exam   Vitals:   12/10/22 2254 12/10/22 2336  BP:    Pulse: (!) 111   Resp: (!) 23   Temp:  99.1 F (37.3 C)  SpO2: 96%     CONSTITUTIONAL:  ill-appearing, NAD NEURO:  Alert and oriented x 3, CN 3-12 grossly intact EYES:  eyes equal and reactive ENT/NECK:  Supple, no stridor  CARDIO:  Tachycardic and regular rhythm, appears well-perfused  PULM: Tachypneic, hypoxic with sats in the mid 80s on 4 L, diminished breath sounds in bases, R>L.  No wheezing or rales. GI/GU:  non-distended, soft MSK/SPINE:  No gross deformities, no edema, moves all extremities SKIN:  no rash, atraumatic  *Additional and/or pertinent findings included in MDM below   ED Results / Procedures / Treatments   Labs (all labs ordered are listed, but only abnormal results are displayed) Labs Reviewed  COMPREHENSIVE METABOLIC PANEL - Abnormal; Notable for the following components:      Result Value   Sodium 134 (*)    Potassium 2.9 (*)    Chloride 97 (*)    Calcium 8.1 (*)    Albumin 3.0 (*)    AST 50 (*)    All other components within normal limits  CBC WITH DIFFERENTIAL/PLATELET - Abnormal; Notable for the following components:   Monocytes Absolute 1.2 (*)  All other components within normal limits  BLOOD GAS, VENOUS - Abnormal; Notable for the following components:   pH, Ven 7.44 (*)    pO2, Ven <31 (*)    Bicarbonate 31.2 (*)    Acid-Base Excess 6.1 (*)    All other components within normal limits  RESP PANEL BY RT-PCR (RSV, FLU A&B, COVID)  RVPGX2  CULTURE, BLOOD (ROUTINE X 2)  CULTURE, BLOOD (ROUTINE X 2)  LACTIC ACID, PLASMA  LACTIC ACID, PLASMA  PROTIME-INR  APTT  URINALYSIS, W/ REFLEX TO CULTURE (INFECTION SUSPECTED)  PROCALCITONIN  POC URINE PREG, ED    EKG EKG Interpretation Date/Time:  Thursday December 10 2022 19:48:19 EDT Ventricular Rate:  135 PR Interval:  120 QRS Duration:  64 QT Interval:  366 QTC Calculation: 549 R Axis:   62  Text  Interpretation: Sinus tachycardia Septal infarct , age undetermined Abnormal ECG When compared with ECG of 16-Oct-2022 18:11, PREVIOUS ECG IS PRESENT Confirmed by Vanetta Mulders [phone]) on 12/10/2022 8:45:44 PM  Radiology CT Angio Chest PE W/Cm &/Or Wo Cm  Result Date: 12/10/2022 CLINICAL DATA:  Cough and body aches. EXAM: CT ANGIOGRAPHY CHEST WITH CONTRAST TECHNIQUE: Multidetector CT imaging of the chest was performed using the standard protocol during bolus administration of intravenous contrast. Multiplanar CT image reconstructions and MIPs were obtained to evaluate the vascular anatomy. RADIATION DOSE REDUCTION: This exam was performed according to the departmental dose-optimization program which includes automated exposure control, adjustment of the mA and/or kV according to patient size and/or use of iterative reconstruction technique. CONTRAST:  75mL OMNIPAQUE IOHEXOL 350 MG/ML SOLN COMPARISON:  October 10, 2022 FINDINGS: Cardiovascular: The thoracic aorta is normal in appearance. Satisfactory opacification of the pulmonary arteries to the segmental level. No evidence of pulmonary embolism. There is mild, stable cardiomegaly. No pericardial effusion. Mediastinum/Nodes: There is mild AP window and bilateral hilar lymphadenopathy which is mildly increased in severity when compared to the prior study. Thyroid gland, trachea, and esophagus demonstrate no significant findings. Lungs/Pleura: Marked severity bilateral airspace disease is seen. This is most prominent within the left upper lobe, right middle lobe and bilateral lower lobes and is mildly increased in severity when compared to the prior study. No pleural effusion or pneumothorax is identified. Upper Abdomen: There is diffuse fatty infiltration of the liver parenchyma. Musculoskeletal: No chest wall abnormality. No acute or significant osseous findings. Review of the MIP images confirms the above findings. IMPRESSION: 1. No evidence of pulmonary  embolism. 2. Worsening marked severity bilateral airspace disease, concerning for worsening multifocal infiltrates. 3. Mild AP window and bilateral hilar lymphadenopathy, mildly increased in severity when compared to the prior study. 4. Fatty liver. Electronically Signed   By: Aram Candela M.D.   On: 12/10/2022 23:21   DG Chest Port 1 View  Result Date: 12/10/2022 CLINICAL DATA:  Questionable sepsis - evaluate for abnormality EXAM: PORTABLE CHEST 1 VIEW COMPARISON:  CTA Chest 10/10/22 FINDINGS: Redemonstrated patchy multifocal airspace opacities in the left mid and lower lung fields, unchanged prior exam. Compared to prior exam there are new hazy opacities in the right lung base are worrisome for infection. No radiographically apparent displaced rib fractures. Visualized upper abdomen unremarkable. Cardiac and mediastinal contours are unchanged. Visualized upper abdomen is unremarkable. IMPRESSION: 1. New hazy opacities in the right lung base are worrisome for infection. 2. Redemonstrated patchy multifocal airspace opacities in the left mid and lower lung fields, unchanged from prior exam. Electronically Signed   By: Elige Radon.D.  On: 12/10/2022 20:52    Procedures .Critical Care  Performed by: Gareth Eagle, PA-C Authorized by: Gareth Eagle, PA-C   Critical care provider statement:    Critical care time (minutes):  30   Critical care was necessary to treat or prevent imminent or life-threatening deterioration of the following conditions:  Respiratory failure and sepsis   Critical care was time spent personally by me on the following activities:  Development of treatment plan with patient or surrogate, discussions with consultants, evaluation of patient's response to treatment, examination of patient, ordering and review of laboratory studies, ordering and review of radiographic studies, ordering and performing treatments and interventions, pulse oximetry, re-evaluation of patient's  condition and review of old charts     Medications Ordered in ED Medications  lactated ringers infusion ( Intravenous New Bag/Given 12/10/22 2121)  cefTRIAXone (ROCEPHIN) 2 g in sodium chloride 0.9 % 100 mL IVPB (0 g Intravenous Stopped 12/10/22 2121)  azithromycin (ZITHROMAX) 500 mg in sodium chloride 0.9 % 250 mL IVPB (0 mg Intravenous Stopped 12/10/22 2233)  iohexol (OMNIPAQUE) 350 MG/ML injection 75 mL (75 mLs Intravenous Contrast Given 12/10/22 2250)    ED Course/ Medical Decision Making/ A&P                                 Medical Decision Making Amount and/or Complexity of Data Reviewed Labs: ordered. Radiology: ordered. ECG/medicine tests: ordered.  Risk Prescription drug management. Decision regarding hospitalization.   Initial Impression and Ddx 32 year old ill-appearing female in respiratory failure presenting for cough and trouble breathing.  DDx includes sepsis, pneumonia, PE, COPD or asthma exacerbation, CHF exacerbation, ACS, other inflammatory pulmonary process. Patient PMH that increases complexity of ED encounter:  recent history of pneumonia  Interpretation of Diagnostics - I independent reviewed and interpreted the labs as followed: hypokalemic  - I independently visualized the following imaging with scope of interpretation limited to determining acute life threatening conditions related to emergency care: CTA chest, which revealed worsening multifocal opacities in bilateral hilar and lymphadenopathy but negative for PE  -I personally reviewed interpret EKG which revealed tachycardia  Patient Reassessment and Ultimate Disposition/Management Initial presentation concerning for sepsis secondary to pneumonia with acute respiratory failure with hypoxia.  Increased her O2 to 6 L and her sats did improve.  Eventually transferred to high flow nasal cannula.  CTA findings suggestive of worsening pneumonia.  Still also working up for sepsis but patient is afebrile,  lactate less than 2 and no leukocytosis making it less likely.  Per chart review, patient is being followed by [facility] who are concern for possible inflammatory lung disease.  Admitted to hospital service for pneumonia with acute respiratory failure and hypoxia. Dr. Victorino Dike.   Patient management required discussion with the following services or consulting groups:  Hospitalist Service  Complexity of Problems Addressed Acute complicated illness or Injury  Additional Data Reviewed and Analyzed Further history obtained from: Past medical history and medications listed in the EMR, Prior ED visit notes, and Recent discharge summary  Patient Encounter Risk Assessment Consideration of hospitalization         Final Clinical Impression(s) / ED Diagnoses Final diagnoses:  Respiratory failure with hypoxia, unspecified chronicity (HCC)  Pneumonia due to infectious organism, unspecified laterality, unspecified part of lung    Rx / DC Orders ED Discharge Orders     None         Riki Sheer  K, PA-C 12/10/22 2343    Vanetta Mulders, MD 12/11/22 [phone]

## 2022-12-10 NOTE — Progress Notes (Signed)
Patient placed on [place] HFNC at 6L.  Current sat at 94%.

## 2022-12-10 NOTE — Progress Notes (Signed)
Elink monitoring for the code sepsis protocol.  

## 2022-12-11 ENCOUNTER — Inpatient Hospital Stay (HOSPITAL_COMMUNITY): Payer: Medicaid Other

## 2022-12-11 ENCOUNTER — Encounter (HOSPITAL_COMMUNITY): Payer: Self-pay | Admitting: Family Medicine

## 2022-12-11 ENCOUNTER — Telehealth: Payer: Medicaid Other | Admitting: Pulmonary Disease

## 2022-12-11 DIAGNOSIS — J9601 Acute respiratory failure with hypoxia: Secondary | ICD-10-CM

## 2022-12-11 LAB — COMPREHENSIVE METABOLIC PANEL
ALT: 15 U/L (ref 0–44)
AST: 41 U/L (ref 15–41)
Albumin: 2.5 g/dL — ABNORMAL LOW (ref 3.5–5.0)
Alkaline Phosphatase: 42 U/L (ref 38–126)
Anion gap: 6 (ref 5–15)
BUN: 6 mg/dL (ref 6–20)
CO2: 26 mmol/L (ref 22–32)
Calcium: 7.9 mg/dL — ABNORMAL LOW (ref 8.9–10.3)
Chloride: 102 mmol/L (ref 98–111)
Creatinine, Ser: 0.56 mg/dL (ref 0.44–1.00)
GFR, Estimated: >60 mL/min (ref >60)
Glucose, Bld: 112 mg/dL — ABNORMAL HIGH (ref 70–99)
Potassium: 2.8 mmol/L — ABNORMAL LOW (ref 3.5–5.1)
Sodium: 134 mmol/L — ABNORMAL LOW (ref 135–145)
Total Bilirubin: 0.5 mg/dL (ref 0.3–1.2)
Total Protein: 6.6 g/dL (ref 6.5–8.1)

## 2022-12-11 LAB — URINALYSIS, W/ REFLEX TO CULTURE (INFECTION SUSPECTED)
Bilirubin Urine: NEGATIVE
Glucose, UA: NEGATIVE mg/dL
Hgb urine dipstick: NEGATIVE
Ketones, ur: 15 mg/dL — AB
Leukocytes,Ua: NEGATIVE
Nitrite: NEGATIVE
Protein, ur: 30 mg/dL — AB
Specific Gravity, Urine: 1.01 (ref 1.005–1.030)
pH: 7 (ref 5.0–8.0)

## 2022-12-11 LAB — CBC WITH DIFFERENTIAL/PLATELET
Abs Immature Granulocytes: 0.02 10*3/uL (ref 0.00–0.07)
Basophils Absolute: 0 10*3/uL (ref 0.0–0.1)
Basophils Relative: 0 %
Eosinophils Absolute: 0 10*3/uL (ref 0.0–0.5)
Eosinophils Relative: 1 %
HCT: 36.4 % (ref 36.0–46.0)
Hemoglobin: 11.5 g/dL — ABNORMAL LOW (ref 12.0–15.0)
Immature Granulocytes: 0 %
Lymphocytes Relative: 19 %
Lymphs Abs: 1.2 10*3/uL (ref 0.7–4.0)
MCH: 27.9 pg (ref 26.0–34.0)
MCHC: 31.6 g/dL (ref 30.0–36.0)
MCV: 88.3 fL (ref 80.0–100.0)
Monocytes Absolute: 1.2 10*3/uL — ABNORMAL HIGH (ref 0.1–1.0)
Monocytes Relative: 20 %
Neutro Abs: 3.6 10*3/uL (ref 1.7–7.7)
Neutrophils Relative %: 60 %
Platelets: 298 10*3/uL (ref 150–400)
RBC: 4.12 MIL/uL (ref 3.87–5.11)
RDW: 14.8 % (ref 11.5–15.5)
WBC: 6.1 10*3/uL (ref 4.0–10.5)
nRBC: 0 % (ref 0.0–0.2)

## 2022-12-11 LAB — POC URINE PREG, ED: Preg Test, Ur: NEGATIVE

## 2022-12-11 LAB — MRSA NEXT GEN BY PCR, NASAL: MRSA by PCR Next Gen: NOT DETECTED

## 2022-12-11 LAB — PROCALCITONIN: Procalcitonin: <0.1 ng/mL

## 2022-12-11 LAB — STREP PNEUMONIAE URINARY ANTIGEN: Strep Pneumo Urinary Antigen: NEGATIVE

## 2022-12-11 MED ORDER — PANTOPRAZOLE SODIUM 40 MG PO TBEC: 40.0000 mg | DELAYED_RELEASE_TABLET | Freq: Every day | ORAL | Status: DC

## 2022-12-11 MED ORDER — ACETAMINOPHEN 650 MG RE SUPP: 650.0000 mg | Freq: Four times a day (QID) | RECTAL | Status: DC | PRN

## 2022-12-11 MED ORDER — HEPARIN SODIUM (PORCINE) 5000 UNIT/ML IJ SOLN
5000.0000 [IU] | Freq: Three times a day (TID) | INTRAMUSCULAR | Status: DC
  Administered 2022-12-11 – 2022-12-17 (×18): 5000 [IU] via SUBCUTANEOUS
  Filled 2022-12-11 (×19): qty 1

## 2022-12-11 MED ORDER — GABAPENTIN 100 MG PO CAPS
100.0000 mg | ORAL_CAPSULE | Freq: Three times a day (TID) | ORAL | Status: DC
  Administered 2022-12-11 – 2022-12-17 (×18): 100 mg via ORAL
  Filled 2022-12-11 (×19): qty 1

## 2022-12-11 MED ORDER — CHLORHEXIDINE GLUCONATE CLOTH 2 % EX PADS
6.0000 | MEDICATED_PAD | Freq: Every day | CUTANEOUS | Status: DC
  Administered 2022-12-11 – 2022-12-15 (×3): 6 via TOPICAL

## 2022-12-11 MED ORDER — ONDANSETRON HCL 4 MG PO TABS: 4.0000 mg | ORAL_TABLET | Freq: Four times a day (QID) | ORAL | Status: DC | PRN

## 2022-12-11 MED ORDER — ACETAMINOPHEN 325 MG PO TABS
650.0000 mg | ORAL_TABLET | Freq: Four times a day (QID) | ORAL | Status: DC | PRN
  Administered 2022-12-17: 650 mg via ORAL
  Filled 2022-12-11: qty 2

## 2022-12-11 MED ORDER — POTASSIUM CHLORIDE 20 MEQ PO PACK
40.0000 meq | PACK | Freq: Once | ORAL | Status: AC
  Administered 2022-12-11: 40 meq via ORAL
  Filled 2022-12-11: qty 2

## 2022-12-11 MED ORDER — METHYLPREDNISOLONE SODIUM SUCC 125 MG IJ SOLR
125.0000 mg | Freq: Every day | INTRAMUSCULAR | Status: AC
  Administered 2022-12-11 – 2022-12-13 (×3): 125 mg via INTRAVENOUS
  Filled 2022-12-11 (×3): qty 2

## 2022-12-11 MED ORDER — OXYCODONE HCL 5 MG PO TABS: 5.0000 mg | ORAL_TABLET | ORAL | Status: DC | PRN

## 2022-12-11 MED ORDER — HEPARIN SODIUM (PORCINE) 5000 UNIT/ML IJ SOLN: 5000.0000 [IU] | Freq: Three times a day (TID) | INTRAMUSCULAR | Status: DC

## 2022-12-11 MED ORDER — ONDANSETRON HCL 4 MG/2ML IJ SOLN: 4.0000 mg | Freq: Four times a day (QID) | INTRAMUSCULAR | Status: DC | PRN

## 2022-12-11 MED ORDER — PANTOPRAZOLE SODIUM 40 MG IV SOLR
40.0000 mg | INTRAVENOUS | Status: DC
  Administered 2022-12-11 – 2022-12-16 (×6): 40 mg via INTRAVENOUS
  Filled 2022-12-11 (×6): qty 10

## 2022-12-11 NOTE — TOC CM/SW Note (Signed)
Transition of Care [facility]) - Inpatient Brief Assessment   Patient Details  Name: Madison Decker MRN: [id number] Date of Birth: [birth year]  Transition of Care [facility]) CM/SW Contact:    Villa Herb, LCSWA Phone Number: 12/11/2022, 10:35 AM   Clinical Narrative: Transition of Care Department [facility]) has reviewed patient and no TOC needs have been identified at this time. We will continue to monitor patient advancement through interdisciplinary progression rounds. If new patient transition needs arise, please place a TOC consult.  Transition of Care Asessment: Insurance and Status: Insurance coverage has been reviewed Patient has primary care physician: Yes Home environment has been reviewed: From home Prior level of function:: independent Prior/Current Home Services: No current home services Social Determinants of Health Reivew: SDOH reviewed no interventions necessary Readmission risk has been reviewed: Yes Transition of care needs: no transition of care needs at this time

## 2022-12-11 NOTE — ED Notes (Signed)
Pt attempting to eat and begins to cough and then spits up food. Family states pt does this at home too. Nurse asked pt if she had heartburn and she said yes. Pt stated it feels like the food gets stuck st the bottom of her throat. Attending aware.

## 2022-12-11 NOTE — H&P (Signed)
History and Physical    Patient: Madison Decker GEX:[id number] DOB: 02-May-1990 DOA: 12/10/2022 DOS: the patient was seen and examined on 12/11/2022 PCP: Rica Records, FNP  Patient coming from: Home  Chief Complaint:  Chief Complaint  Patient presents with   Cough   HPI: Madison Decker is a 32 y.o. female with medical history significant of angioedema, presents to the ED with a chief complaint of dyspnea.  Patient reports that she has been sick for a while.  She had pneumonia back in July.  She felt better for for 5 days before she started feeling worse again.  She feels like the symptoms started the beginning of August.  She had a coughing spell today with production of clear sputum.  She coughed so hard that her oxygen dropped to 70s.  She was dizzy, lightheaded, no blood, no fever.  Patient denies fever or any hemoptysis.  She has not been on any recent antibiotics per her report.  She did have an appoint with Dr. Work coming up.  She has no oxygen at home.  She is requiring 5 L nasal cannula here.  Patient has no other complaints at this time.  Patient does not smoke and does not drink.  Patient is full code. Review of Systems: As mentioned in the history of present illness. All other systems reviewed and are negative. Past Medical History:  Diagnosis Date   Angio-edema    Medical history non-contributory    Past Surgical History:  Procedure Laterality Date   ARTHROSCOPIC REPAIR ACL Right    Social History:  reports that she has never smoked. She has never used smokeless tobacco. She reports current alcohol use. She reports that she does not use drugs.  No Known Allergies  Family History  Problem Relation Age of Onset   Asthma Mother    Cancer Maternal Grandmother        breast   Thyroid disease Maternal Grandmother     Prior to Admission medications   Medication Sig Start Date End Date Taking? Authorizing Provider  cetirizine (ZYRTEC) 5 MG tablet Take 5 mg by  mouth at bedtime.   Yes [provider]  EPINEPHrine 0.3 mg/0.3 mL IJ SOAJ injection Inject 0.3 mg into the muscle as needed for anaphylaxis. 03/13/20  Yes Novella Olive, FNP  Multiple Vitamins-Minerals (WOMENS MULTIVITAMIN) TABS Take 1 tablet by mouth daily.   Yes [provider]  triamcinolone cream (KENALOG) 0.1 % Apply 1 Application topically 2 (two) times daily. 12/07/22  Yes Del Newman Nip, Tenna Child, FNP  gabapentin (NEURONTIN) 100 MG capsule Take 1 capsule (100 mg total) by mouth 3 (three) times daily. 12/07/22   Rica Records, FNP    Physical Exam: Vitals:   12/11/22 0415 12/11/22 0500 12/11/22 0532 12/11/22 0552  BP: (!) 97/59 (!) 88/53  99/65  Pulse: 95 85  88  Resp: 15 (!) 24  (!) 22  Temp:   98 F (36.7 C)   TempSrc:      SpO2: 93% 94%  98%  Weight:      Height:       1.  General: Patient lying supine in bed,  no acute distress   2. Psychiatric: Alert and oriented x 3, mood and behavior normal for situation, pleasant and cooperative with exam   3. Neurologic: Speech and language are normal, face is symmetric, moves all 4 extremities voluntarily, at baseline without acute deficits on limited exam   4. HEENMT:  Head  is atraumatic, normocephalic, pupils reactive to light, neck is supple, trachea is midline, mucous membranes are moist   5. Respiratory : Lungs are clear to auscultation bilaterally without wheezing, rhonchi, rales, no cyanosis, resting comfortably on 5 L nasal cannula  6. Cardiovascular : Heart rate normal, rhythm is regular, no murmurs, rubs or gallops, no peripheral edema, peripheral pulses palpated   7. Gastrointestinal:  Abdomen is soft, nondistended, nontender to palpation bowel sounds active, no masses or organomegaly palpated   8. Skin:  Skin is warm, dry and intact without rashes, acute lesions, or ulcers on limited exam   9.Musculoskeletal:  No acute deformities or trauma, no asymmetry in tone, no peripheral  edema, peripheral pulses palpated, no tenderness to palpation in the extremities  Data Reviewed: In the ED temp 98.9, heart rate 113-20 33, blood pressure 109/72, O2 sats documented as low as 88, with his high of the need for supplementation at 6 L high flow cannula No leukocytosis with a white blood cell count of 5.5, hemoglobin 13.5 Chemistry reveals a hypokalemia at 2.9 EKG showed a heart rate of 124, sinus tach, QTc 427 When the hospitalist was consulted I advise getting a CTA due to tachycardia, hypoxia the CTA shows no evidence of PE.  Worsened marked severity bilaterally of the airspace disease Admission requested for pneumonia  Assessment and Plan: Pneumonia - Continue Rocephin and Zithromax - Continue steroid - Continue to monitor  Hypokalemia - Replace and recheck    Advance Care Planning:   Code Status: Full Code  Consults: None at this time  Family Communication: No family at bedside  Severity of Illness: The appropriate patient status for this patient is OBSERVATION. Observation status is judged to be reasonable and necessary in order to provide the required intensity of service to ensure the patient's safety. The patient's presenting symptoms, physical exam findings, and initial radiographic and laboratory data in the context of their medical condition is felt to place them at decreased risk for further clinical deterioration. Furthermore, it is anticipated that the patient will be medically stable for discharge from the hospital within 2 midnights of admission.   Author: Lilyan Gilford, DO 12/11/2022 6:32 AM  For on call review [email or web address].

## 2022-12-11 NOTE — Consult Note (Signed)
NAME:  Madison Decker, MRN:  [id number], DOB:  October 23, 1990, LOS: 1 ADMISSION DATE:  12/10/2022, CONSULTATION DATE:  12/12/22  REFERRING MD:  TRH, CHIEF COMPLAINT:  DOE   History of Present Illness:  32 year old female followed by Dr Francine Graven for suspected inflammatory lung disease. Previously steroid responsive. Autoimmune work up to date negative. Steroid taper stopped 7/25.  Presented to the ER at [facility] on 8/30 with worsening symptoms.  Reportedly hypoxemic.  Given IV steroids.  Procalcitonin negative.  CT scan shows similar but slightly worsening left-sided predominant peripheral nodular based opacities.  Concerning for inflammatory infiltrate.  O2 saturations improved remains on 6 L nasal cannula.  Overall breathing, fatigue etc. improved with dose of steroids.  Family in room corroborates that when she is on steroids she feels great but shortly after she stopped steroids she develops fatigue etc. for the last couple weeks she has basically been in bed with severe fatigue and shortness of breath.  Pertinent  Medical History  Never smoker, 20-30 lb wt loss Bilateral joint swelling recent admit July 2024 for multifocal PNA. Seen 7/25 by Francine Graven for f/u: noted to be feeling better, Completing steroid taper that day. Autoimmune work up to date was negative. Over all feeling was she has undetermined inflammatory lung disease.  CT chest 6/29 w/ numerous enlarged mediastinal and hilar and bilateral axillary LNs w/ extensive heterogenous consolidative airspace disease t/o but greatest in Left lung base.   Significant Hospital Events: Including procedures, antibiotic start and stop dates in addition to other pertinent events   8/30 presented to [facility] worsening dyspnea transferred to [facility] long  Interim History / Subjective:  Received steroids.  Family will bit better.  Breathing feels easier.  More energy etc.  Objective   Blood pressure 112/67, pulse 97, temperature 98.1 F (36.7 C), temperature  source Oral, resp. rate (!) 22, height 5\' 4"  (1.626 m), weight 76.7 kg, last menstrual period 11/24/2022, SpO2 99%.        Intake/Output Summary (Last 24 hours) at 12/11/2022 1535 Last data filed at 12/11/2022 0102 Gross per 24 hour  Intake 1745.27 ml  Output --  Net 1745.27 ml   Filed Weights   12/10/22 2045 12/11/22 1505  Weight: 75.3 kg 76.7 kg    Examination: General: Sitting up in bed, no acute distress HENT: Atraumatic normocephalic, moist mucous membranes Eyes: EOMI, no icterus  Lungs: Normal work of breathing, on 6 L Cardiovascular: Warm, no edema, regular rate and rhythm Abdomen: Nondistended bowel sounds present MSK: No synovitis with no joint effusion Neuro: Moves all tremors, no focal deficits, alert Psych: Normal mood, full affect  Resolved Hospital Problem list     Assessment & Plan:  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  Chronic infiltrates as described, left-sided predominant peripheral nodular like.  Prior rheumatologic workup negative.  Sed rate mildly elevated in the past.  Procalcitonin negative.  Sarcoidosis possible.  Mild lymphadenopathy seen on CT. -- Solu-Medrol 125 mg IV daily x 3 days then plan to taper -- Tentative plan for bronchoscopy with biopsy/possible EBUS 12/15/2022  PCCM will reevaluate in the coming days.  Awaiting bronchoscopy.  Best Practice (right click and "Reselect all SmartList Selections" daily)   Per Primary  Labs   CBC: Recent Labs  Lab 12/07/22 1058 12/10/22 2024 12/11/22 0309  WBC 6.0 5.5 6.1  NEUTROABS 4.3 2.9 3.6  HGB 14.0 13.5 11.5*  HCT 44.3 42.7 36.4  MCV 89 89.7 88.3  PLT 314  318 298    Basic Metabolic Panel: Recent Labs  Lab 12/07/22 1058 12/10/22 2024 12/11/22 0309  NA 139 134* 134*  K 3.4* 2.9* 2.8*  CL 101 97* 102  CO2 24 28 26   GLUCOSE 90 97 112*  BUN 7 8 6   CREATININE 0.62 0.64 0.56  CALCIUM 8.9 8.1* 7.9*   GFR: Estimated Creatinine Clearance: 103.1 mL/min  (by C-G formula based on SCr of 0.56 mg/dL). Recent Labs  Lab 12/07/22 1058 12/10/22 2024 12/10/22 2119 12/11/22 0309  PROCALCITON  --   --  <0.10  --   WBC 6.0 5.5  --  6.1  LATICACIDVEN  --  1.2 1.1  --     Liver Function Tests: Recent Labs  Lab 12/07/22 1058 12/10/22 2024 12/11/22 0309  AST 46* 50* 41  ALT 13 18 15   ALKPHOS 72 51 42  BILITOT 0.5 0.8 0.5  PROT 7.4 8.0 6.6  ALBUMIN 3.6* 3.0* 2.5*   No results for input(s): "LIPASE", "AMYLASE" in the last 168 hours. No results for input(s): "AMMONIA" in the last 168 hours.  ABG    Component Value Date/Time   HCO3 31.2 (H) 12/10/2022 2119   O2SAT 17.6 12/10/2022 2119     Coagulation Profile: Recent Labs  Lab 12/10/22 2024  INR 1.0    Cardiac Enzymes: No results for input(s): "CKTOTAL", "CKMB", "CKMBINDEX", "TROPONINI" in the last 168 hours.  HbA1C: Hgb A1c MFr Bld  Date/Time Value Ref Range Status  12/07/2022 10:58 AM 6.1 (H) 4.8 - 5.6 % Final    Comment:             Prediabetes: 5.7 - 6.4          Diabetes: >6.4          Glycemic control for adults with diabetes: <7.0     CBG: No results for input(s): "GLUCAP" in the last 168 hours.  Review of Systems:   No chest pain nutrition.  Orthopnea or PND.  Comprehensive review of systems otherwise negative.  Past Medical History:  She,  has a past medical history of Angio-edema and Medical history non-contributory.   Surgical History:   Past Surgical History:  Procedure Laterality Date   ARTHROSCOPIC REPAIR ACL Right      Social History:   reports that she has never smoked. She has never used smokeless tobacco. She reports current alcohol use. She reports that she does not use drugs.   Family History:  Her family history includes Asthma in her mother; Cancer in her maternal grandmother; Thyroid disease in her maternal grandmother.   Allergies No Known Allergies   Home Medications  Prior to Admission medications   Medication Sig Start Date End  Date Taking? Authorizing Provider  cetirizine (ZYRTEC) 5 MG tablet Take 5 mg by mouth at bedtime.   Yes [provider]  EPINEPHrine 0.3 mg/0.3 mL IJ SOAJ injection Inject 0.3 mg into the muscle as needed for anaphylaxis. 03/13/20  Yes Novella Olive, FNP  Multiple Vitamins-Minerals (WOMENS MULTIVITAMIN) TABS Take 1 tablet by mouth daily.   Yes [provider]  triamcinolone cream (KENALOG) 0.1 % Apply 1 Application topically 2 (two) times daily. 12/07/22  Yes Del Newman Nip, Tenna Child, FNP  gabapentin (NEURONTIN) 100 MG capsule Take 1 capsule (100 mg total) by mouth 3 (three) times daily. 12/07/22   Del Nigel Berthold, FNP     Critical care time: n/a

## 2022-12-11 NOTE — Progress Notes (Addendum)
[facility] PROGRESS NOTE  Madison Decker (DOB: 30-Jan-1991) [id number] PCP: Rica Records, FNP  Brief Narrative: Madison Decker is a 32 y.o. female with a history of allergies/angioedema and admission for pneumonia July 2025, suspicion for autoimmune/inflammatory process not otherwise specified who presented to the ED on 12/10/2022 with worsening dyspnea and cough for the previous week as well as several weeks of weight loss, fatigue, weakness, hand/feet swelling and numbness that worsened after completing prednisone taper.   She was hypoxemic, tachycardic, and tachypneic. CBC and lactic acid were normal, PCT undetectable. Hypoalbuminemia and hypokalemia noted. UPT negative. Strep pneumo UAg neg. CTA showed no PE, but there is worsening severe bilateral airspace disease and enlarged hilar lymphadenopathy.   Subjective: Short of breath with exertion and at rest with cough productive of clear sputum. No new complaints since admission this morning. Mother at bedside. When trying to eat breakfast she reported globus sensation/like solids get stuck in the lower throat and had to regurgitate it. She's been having vomiting associated with the coughing spells as well.   Objective: BP (!) 90/51   Pulse 94   Temp 98 F (36.7 C)   Resp (!) 21   Ht 5\' 5"  (1.651 m)   Wt 75.3 kg   LMP 11/24/2022 (Exact Date)   SpO2 95%   BMI 27.62 kg/m   Gen: 32yo F in no acute distress Pulm: Tachypneic with normal effort, coarse bilaterally  CV: Regular tachycardia without MRG or pitting edema. GI: Soft, NT, ND, +BS Neuro: Alert and oriented. No new focal deficits. Ext: Warm, nontender nonpitting edema involving hands and feet diffusely. No swollen large joints or focal arthritis Skin: Several hyperpigmented, occasionally scarring lesions on back, neck and per report thighs. Faint malar rash.   Assessment & Plan: Acute hypoxic respiratory failure due to CAP superimposed on inflammatory lung  disease: Recently nonspecific/negative work up during last admission. Still with significant opacities and new haziness. Pneumonia is likely cause for more recent development of cough and increased dyspnea on her otherwise subacute (since May-June) decline which was steroid-responsive.  - Not in respiratory distress, though requiring HFNC. Remains appropriate for SDU admission.  - Continue ceftriaxone, azithromycin.  - Continue solumedrol, anticipate prolonged prednisone taper after she improves. Anticipate this will help with extrapulmonary manifestations as well.  - Will consult PCCM for further recommendations, will miss video appt with Dr. Francine Graven today at 4pm.  - Has f/u with rheumatology but not anytime soon, May 04, 2023 with Dr. Corliss Skains. No inpatient consultation available at [facility].   Globus sensation, postprandial emesis: At risk of esophageal stenosis.  - Start PPI and work up with esophagram.  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  - Has f/u with Dr. Terrace Arabia, neurology, 02/26/2023  Sinus tachycardia: TSH 1.750.  - Pt has been referred to cardiology, Dr. Jenene Slicker (appt 10/3), though I suspect this is secondary to other conditions. Will remain on telemetry while admitted.   HbA1c 6.1%.  - CBGs AC/HS, sensitive SSI for steroid induced hyperglycemia.   Tyrone Nine, MD [facility] [email or web address] 12/11/2022, 9:11 AM

## 2022-12-12 ENCOUNTER — Other Ambulatory Visit: Payer: Self-pay | Admitting: Family Medicine

## 2022-12-12 DIAGNOSIS — J9601 Acute respiratory failure with hypoxia: Secondary | ICD-10-CM | POA: Diagnosis not present

## 2022-12-12 LAB — COMPREHENSIVE METABOLIC PANEL
ALT: 15 U/L (ref 0–44)
AST: 41 U/L (ref 15–41)
Albumin: 2.8 g/dL — ABNORMAL LOW (ref 3.5–5.0)
Alkaline Phosphatase: 46 U/L (ref 38–126)
Anion gap: 10 (ref 5–15)
BUN: 9 mg/dL (ref 6–20)
CO2: 25 mmol/L (ref 22–32)
Calcium: 8.2 mg/dL — ABNORMAL LOW (ref 8.9–10.3)
Chloride: 102 mmol/L (ref 98–111)
Creatinine, Ser: 0.54 mg/dL (ref 0.44–1.00)
GFR, Estimated: >60 mL/min (ref >60)
Glucose, Bld: 126 mg/dL — ABNORMAL HIGH (ref 70–99)
Potassium: 3.2 mmol/L — ABNORMAL LOW (ref 3.5–5.1)
Sodium: 137 mmol/L (ref 135–145)
Total Bilirubin: 0.7 mg/dL (ref 0.3–1.2)
Total Protein: 7.3 g/dL (ref 6.5–8.1)

## 2022-12-12 LAB — SEDIMENTATION RATE: Sed Rate: 36 mm/hr — ABNORMAL HIGH (ref 0–22)

## 2022-12-12 LAB — CBC
HCT: 39.5 % (ref 36.0–46.0)
Hemoglobin: 12.3 g/dL (ref 12.0–15.0)
MCH: 28.3 pg (ref 26.0–34.0)
MCHC: 31.1 g/dL (ref 30.0–36.0)
MCV: 91 fL (ref 80.0–100.0)
Platelets: 272 10*3/uL (ref 150–400)
RBC: 4.34 MIL/uL (ref 3.87–5.11)
RDW: 14.7 % (ref 11.5–15.5)
WBC: 4.6 10*3/uL (ref 4.0–10.5)
nRBC: 0 % (ref 0.0–0.2)

## 2022-12-12 LAB — C-REACTIVE PROTEIN: CRP: 1.2 mg/dL — ABNORMAL HIGH (ref <1.0)

## 2022-12-12 LAB — MAGNESIUM: Magnesium: 2.2 mg/dL (ref 1.7–2.4)

## 2022-12-12 MED ORDER — VITAMIN D3 25 MCG (1000 UT) PO CAPS: 1000.0000 [IU] | ORAL_CAPSULE | Freq: Every day | ORAL | 1 refills | Status: AC

## 2022-12-12 NOTE — Progress Notes (Signed)
PROGRESS NOTE  Madison Decker YQI:[id number] DOB: 02/17/1991   PCP: Rica Records, FNP  Patient is from: Home  DOA: 12/10/2022 LOS: 2  Chief complaints Chief Complaint  Patient presents with   Cough     Brief Narrative / Interim history: 32 year old F with PMH of allergy/angioedema and recent admission in 10/2022 with multifocal pulmonary infiltrate and lymphadenopathy and treated with antibiotics and a steroid and discharged home to follow-up with pulmonology.  She presents with progressive dyspnea and cough for about a week.  She also had associated weight loss, fatigue, generalized weakness, extremity swelling and numbness.  CT angio chest negative for PE but worsening multifocal infiltrate and mild increased severity of bilateral healer LAD.  Admitted with working diagnosis of pneumonia/inflammatory lung disease.  Started on antibiotics and Solu-Medrol.  PCCM consulted.   Patient had extensive workup with echocardiogram, autoimmune/serology labs unrevealing prior hospitalization.    Subjective: Seen and examined earlier this morning.  No major events overnight of this morning.  Reports feeling better.  Saturating in upper 90s on 6 L by nasal cannula.  Improved breathing, energy, and cough.  Denies chest pain, GI or UTI symptoms.  Denies dysphagia or odynophagia.  Patient's mother at bedside.  Objective: Vitals:   12/11/22 1818 12/11/22 2228 12/12/22 0204 12/12/22 0610  BP: 137/82 121/78 114/67 113/66  Pulse: 99 92 75 83  Resp: 19 18 18 18   Temp: 98 F (36.7 C) 98.2 F (36.8 C) 98.5 F (36.9 C) 98.1 F (36.7 C)  TempSrc: Oral  Oral Oral  SpO2: 97% 98% 99% 98%  Weight:      Height:        Examination:  GENERAL: No apparent distress.  Nontoxic. HEENT: MMM.  Vision and hearing grossly intact.  NECK: Supple.  No apparent JVD.  RESP:  No IWOB.  Crackles globally. CVS:  RRR. Heart sounds normal.  ABD/GI/GU: BS+. Abd soft, NTND.  MSK/EXT:  Moves extremities. No  apparent deformity. No edema.  SKIN: no apparent skin lesion or wound NEURO: Awake, alert and oriented appropriately.  No apparent focal neuro deficit. PSYCH: Calm. Normal affect.   Procedures:  None  Microbiology summarized: COVID-19, influenza and RSV PCR nonreactive Blood cultures NGTD MRSA PCR screen negative  Assessment and plan: Principal Problem:   Acute respiratory failure with hypoxia (HCC)  Acute hypoxic respiratory failure due to multifocal pulmonary infiltrate: Per EDP note, O2 sat was 73% on RA on arrival to ED, and only improved to 82% on 2 L.  Currently on 6 L by nasal cannula saturating in upper 90s.  CT angio chest negative for PE but worsening infiltrate and healer lymphadenopathy.  No leukocytosis.  Pro-Cal negative.  Patient had normal TTE, serology and autoimmune workup..  Symptoms improving with antibiotic and steroid.   -Continue high-dose Solu-Medrol, CTX and Zithromax -Follow PCCM recommendation -Wean oxygen as able.  Incentive telemetry, OOB and ambulatory saturation -Seems she has follow-up with rheumatology, Dr. Corliss Skains on 05/04/2023    Globus sensation, postprandial emesis: At risk of esophageal stenosis.  Esophagram without significant finding other than some reflux signs. 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  B12 397. -Has f/u with Dr. Terrace Arabia, neurology, 02/26/2023 -Continue gabapentin   Sinus tachycardia: TSH 1.750.  Resolved. - Pt has been referred to cardiology, Dr. Jenene Slicker (appt 10/3), though I suspect this is secondary to other conditions.   Prediabetes: HbA1c 6.1%.  -CBGs AC/HS, sensitive SSI for steroid induced hyperglycemia.  Hypokalemia -Monitor replenish as appropriate  Hyponatremia: Mild -Continue monitoring  Body mass index is 29.02 kg/m.           DVT prophylaxis:  heparin injection 5,000 Units Start: 12/11/22 0600 SCDs Start: 12/11/22 0045  Code Status:  Full code Family Communication: Updated patient's mother at bedside Level of care: Progressive Status is: Inpatient Remains inpatient appropriate because: Respiratory failure   Final disposition: Home Consultants:  Pulmonology  55 minutes with more than 50% spent in reviewing records, counseling patient/family and coordinating care.   Sch Meds:  Scheduled Meds:  Chlorhexidine Gluconate Cloth  6 each Topical Daily   gabapentin  100 mg Oral TID   heparin  5,000 Units Subcutaneous Q8H   methylPREDNISolone (SOLU-MEDROL) injection  125 mg Intravenous Daily   pantoprazole (PROTONIX) IV  40 mg Intravenous Q24H   Continuous Infusions:  azithromycin 500 mg (12/11/22 2127)   cefTRIAXone (ROCEPHIN)  IV 2 g (12/11/22 2012)   PRN Meds:.acetaminophen **OR** acetaminophen, ondansetron **OR** ondansetron (ZOFRAN) IV, oxyCODONE  Antimicrobials: Anti-infectives (From admission, onward)    Start     Dose/Rate Route Frequency Ordered Stop   12/10/22 2000  cefTRIAXone (ROCEPHIN) 2 g in sodium chloride 0.9 % 100 mL IVPB        2 g 200 mL/hr over 30 Minutes Intravenous Every 24 hours 12/10/22 1957 12/15/22 1959   12/10/22 2000  azithromycin (ZITHROMAX) 500 mg in sodium chloride 0.9 % 250 mL IVPB        500 mg 250 mL/hr over 60 Minutes Intravenous Every 24 hours 12/10/22 1957 12/15/22 1959        I have personally reviewed the following labs and images: CBC: Recent Labs  Lab 12/07/22 1058 12/10/22 2024 12/11/22 0309  WBC 6.0 5.5 6.1  NEUTROABS 4.3 2.9 3.6  HGB 14.0 13.5 11.5*  HCT 44.3 42.7 36.4  MCV 89 89.7 88.3  PLT 314 318 298   BMP &GFR Recent Labs  Lab 12/07/22 1058 12/10/22 2024 12/11/22 0309  NA 139 134* 134*  K 3.4* 2.9* 2.8*  CL 101 97* 102  CO2 24 28 26   GLUCOSE 90 97 112*  BUN 7 8 6   CREATININE 0.62 0.64 0.56  CALCIUM 8.9 8.1* 7.9*   Estimated Creatinine Clearance: 103.1 mL/min (by C-G formula based on SCr of 0.56 mg/dL). Liver & Pancreas: Recent Labs   Lab 12/07/22 1058 12/10/22 2024 12/11/22 0309  AST 46* 50* 41  ALT 13 18 15   ALKPHOS 72 51 42  BILITOT 0.5 0.8 0.5  PROT 7.4 8.0 6.6  ALBUMIN 3.6* 3.0* 2.5*   No results for input(s): "LIPASE", "AMYLASE" in the last 168 hours. No results for input(s): "AMMONIA" in the last 168 hours. Diabetic: No results for input(s): "HGBA1C" in the last 72 hours. No results for input(s): "GLUCAP" in the last 168 hours. Cardiac Enzymes: No results for input(s): "CKTOTAL", "CKMB", "CKMBINDEX", "TROPONINI" in the last 168 hours. No results for input(s): "PROBNP" in the last 8760 hours. Coagulation Profile: Recent Labs  Lab 12/10/22 2024  INR 1.0   Thyroid Function Tests: No results for input(s): "TSH", "T4TOTAL", "FREET4", "T3FREE", "THYROIDAB" in the last 72 hours. Lipid Profile: No results for input(s): "CHOL", "HDL", "LDLCALC", "TRIG", "CHOLHDL", "LDLDIRECT" in the last 72 hours. Anemia Panel: No results for input(s): "VITAMINB12", "FOLATE", "FERRITIN", "TIBC", "IRON", "RETICCTPCT" in the last 72 hours. Urine analysis:    Component Value Date/Time   COLORURINE YELLOW 12/11/2022 0050   APPEARANCEUR CLEAR 12/11/2022 0050   LABSPEC 1.010  12/11/2022 0050   PHURINE 7.0 12/11/2022 0050   GLUCOSEU NEGATIVE 12/11/2022 0050   HGBUR NEGATIVE 12/11/2022 0050   BILIRUBINUR NEGATIVE 12/11/2022 0050   KETONESUR 15 (A) 12/11/2022 0050   PROTEINUR 30 (A) 12/11/2022 0050   NITRITE NEGATIVE 12/11/2022 0050   LEUKOCYTESUR NEGATIVE 12/11/2022 0050   Sepsis Labs: Invalid input(s): "PROCALCITONIN", "LACTICIDVEN"  Microbiology: Recent Results (from the past 240 hour(s))  Resp panel by RT-PCR (RSV, Flu A&B, Covid) Anterior Nasal Swab     Status: None   Collection Time: 12/10/22  8:23 PM   Specimen: Anterior Nasal Swab  Result Value Ref Range Status   SARS Coronavirus 2 by RT PCR NEGATIVE NEGATIVE Final    Comment: (NOTE) SARS-CoV-2 target nucleic acids are NOT DETECTED.  The SARS-CoV-2 RNA is  generally detectable in upper respiratory specimens during the acute phase of infection. The lowest concentration of SARS-CoV-2 viral copies this assay can detect is 138 copies/mL. A negative result does not preclude SARS-Cov-2 infection and should not be used as the sole basis for treatment or other patient management decisions. A negative result may occur with  improper specimen collection/handling, submission of specimen other than nasopharyngeal swab, presence of viral mutation(s) within the areas targeted by this assay, and inadequate number of viral copies(<138 copies/mL). A negative result must be combined with clinical observations, patient history, and epidemiological information. The expected result is Negative.  Fact Sheet for Patients:  BloggerCourse.com  Fact Sheet for Healthcare Providers:  SeriousBroker.it  This test is no t yet approved or cleared by the [place] FDA and  has been authorized for detection and/or diagnosis of SARS-CoV-2 by FDA under an Emergency Use Authorization (EUA). This EUA will remain  in effect (meaning this test can be used) for the duration of the COVID-19 declaration under Section 564(b)(1) of the Act, 21 U.S.C.section 360bbb-3(b)(1), unless the authorization is terminated  or revoked sooner.       Influenza A by PCR NEGATIVE NEGATIVE Final   Influenza B by PCR NEGATIVE NEGATIVE Final    Comment: (NOTE) The Xpert Xpress SARS-CoV-2/FLU/RSV plus assay is intended as an aid in the diagnosis of influenza from Nasopharyngeal swab specimens and should not be used as a sole basis for treatment. Nasal washings and aspirates are unacceptable for Xpert Xpress SARS-CoV-2/FLU/RSV testing.  Fact Sheet for Patients: BloggerCourse.com  Fact Sheet for Healthcare Providers: SeriousBroker.it  This test is not yet approved or cleared by the [place] FDA and has been authorized for detection and/or diagnosis of SARS-CoV-2 by FDA under an Emergency Use Authorization (EUA). This EUA will remain in effect (meaning this test can be used) for the duration of the COVID-19 declaration under Section 564(b)(1) of the Act, 21 U.S.C. section 360bbb-3(b)(1), unless the authorization is terminated or revoked.     Resp Syncytial Virus by PCR NEGATIVE NEGATIVE Final    Comment: (NOTE) Fact Sheet for Patients: BloggerCourse.com  Fact Sheet for Healthcare Providers: SeriousBroker.it  This test is not yet approved or cleared by the [place] FDA and has been authorized for detection and/or diagnosis of SARS-CoV-2 by FDA under an Emergency Use Authorization (EUA). This EUA will remain in effect (meaning this test can be used) for the duration of the COVID-19 declaration under Section 564(b)(1) of the Act, 21 U.S.C. section 360bbb-3(b)(1), unless the authorization is terminated or revoked.  Performed at [facility], [address], [place], [place] [address]   Blood Culture (routine x 2)     Status: None (  Preliminary result)   Collection Time: 12/10/22  8:24 PM   Specimen: BLOOD  Result Value Ref Range Status   Specimen Description BLOOD RIGHT ANTECUBITAL  Final   Special Requests   Final    BOTTLES DRAWN AEROBIC AND ANAEROBIC Blood Culture adequate volume   Culture   Final    NO GROWTH 2 DAYS Performed at [facility], [address] [place], [place] [address]    Report Status PENDING  Incomplete  Blood Culture (routine x 2)     Status: None (Preliminary result)   Collection Time: 12/10/22  8:40 PM   Specimen: Left Antecubital; Blood  Result Value Ref Range Status   Specimen Description LEFT ANTECUBITAL  Final   Special Requests   Final    BOTTLES DRAWN AEROBIC AND ANAEROBIC Blood Culture adequate volume   Culture   Final    NO GROWTH 2 DAYS Performed at [facility],  [address], [place], [place] [address]    Report Status PENDING  Incomplete  MRSA Next Gen by PCR, Nasal     Status: None   Collection Time: 12/11/22  3:10 PM   Specimen: Nasal Mucosa; Nasal Swab  Result Value Ref Range Status   MRSA by PCR Next Gen NOT DETECTED NOT DETECTED Final    Comment: (NOTE) The GeneXpert MRSA Assay (FDA approved for NASAL specimens only), is one component of a comprehensive MRSA colonization surveillance program. It is not intended to diagnose MRSA infection nor to guide or monitor treatment for MRSA infections. Test performance is not FDA approved in patients less than 45 years old. Performed at [facility], [address] [place], [place] [address]     Radiology Studies: DG ESOPHAGUS W SINGLE CM (SOL OR THIN BA)  Result Date: 12/11/2022 CLINICAL DATA:  32 year old with dysphagia. Globus sensation. History of pneumonia. EXAM: ESOPHAGUS/BARIUM SWALLOW/TABLET STUDY TECHNIQUE: Combined double and single contrast examination was performed using effervescent crystals, high-density barium, and thin liquid barium. FLUOROSCOPY: Radiation Exposure Index (as provided by the fluoroscopic device): 28.2 mGy Kerma COMPARISON:  Chest CTA 12/10/2022 FINDINGS: Swallowing: Appears normal. No vestibular penetration or aspiration seen. Pharynx: Unremarkable. Esophagus: Normal appearance. Esophageal motility: Within normal limits. Hiatal Hernia: None. Gastroesophageal reflux: Small amount of reflux into the distal esophagus. Ingested 13mm barium tablet: Passed normally. Other: None. IMPRESSION: 1. Normal appearance of the esophagus. No focal stricture or narrowing. 2. Small amount of gastroesophageal reflux. Reflux into the distal esophagus. Electronically Signed   By: Richarda Overlie M.D.   On: 12/11/2022 13:08      Madison Decker [facility]  If 7PM-7AM, please contact night-coverage [email or web address] 12/12/2022, 11:41 AM

## 2022-12-13 DIAGNOSIS — J9601 Acute respiratory failure with hypoxia: Secondary | ICD-10-CM | POA: Diagnosis not present

## 2022-12-13 LAB — RENAL FUNCTION PANEL
Albumin: 2.7 g/dL — ABNORMAL LOW (ref 3.5–5.0)
Anion gap: 7 (ref 5–15)
BUN: 10 mg/dL (ref 6–20)
CO2: 28 mmol/L (ref 22–32)
Calcium: 8.2 mg/dL — ABNORMAL LOW (ref 8.9–10.3)
Chloride: 103 mmol/L (ref 98–111)
Creatinine, Ser: 0.48 mg/dL (ref 0.44–1.00)
GFR, Estimated: >60 mL/min (ref >60)
Glucose, Bld: 107 mg/dL — ABNORMAL HIGH (ref 70–99)
Phosphorus: 4.2 mg/dL (ref 2.5–4.6)
Potassium: 3.5 mmol/L (ref 3.5–5.1)
Sodium: 138 mmol/L (ref 135–145)

## 2022-12-13 LAB — MAGNESIUM: Magnesium: 2.4 mg/dL (ref 1.7–2.4)

## 2022-12-13 MED ORDER — METHYLPREDNISOLONE SODIUM SUCC 125 MG IJ SOLR
80.0000 mg | Freq: Every day | INTRAMUSCULAR | Status: DC
  Administered 2022-12-14 – 2022-12-15 (×2): 80 mg via INTRAVENOUS
  Filled 2022-12-13 (×2): qty 2

## 2022-12-13 MED ORDER — POTASSIUM CHLORIDE CRYS ER 20 MEQ PO TBCR
40.0000 meq | EXTENDED_RELEASE_TABLET | Freq: Once | ORAL | Status: AC
  Administered 2022-12-13: 40 meq via ORAL
  Filled 2022-12-13: qty 2

## 2022-12-13 MED ORDER — PNEUMOCOCCAL 20-VAL CONJ VACC 0.5 ML IM SUSY
0.5000 mL | PREFILLED_SYRINGE | INTRAMUSCULAR | Status: AC
  Administered 2022-12-14: 0.5 mL via INTRAMUSCULAR
  Filled 2022-12-13: qty 0.5

## 2022-12-13 NOTE — Plan of Care (Signed)
  Problem: Activity: Goal: Ability to tolerate increased activity will improve Outcome: Progressing   Problem: Respiratory: Goal: Ability to maintain adequate ventilation will improve Outcome: Progressing Goal: Ability to maintain a clear airway will improve Outcome: Progressing   Problem: Education: Goal: Knowledge of General Education information will improve Description: Including pain rating scale, medication(s)/side effects and non-pharmacologic comfort measures Outcome: Progressing   Problem: Clinical Measurements: Goal: Ability to maintain a body temperature in the normal range will improve Outcome: Progressing   Problem: Clinical Measurements: Goal: Ability to maintain clinical measurements within normal limits will improve Outcome: Progressing Goal: Will remain free from infection Outcome: Progressing Goal: Diagnostic test results will improve Outcome: Progressing Goal: Respiratory complications will improve Outcome: Progressing Goal: Cardiovascular complication will be avoided Outcome: Progressing

## 2022-12-13 NOTE — Plan of Care (Signed)
  Problem: Activity: Goal: Ability to tolerate increased activity will improve Outcome: Progressing   Problem: Education: Goal: Knowledge of General Education information will improve Description: Including pain rating scale, medication(s)/side effects and non-pharmacologic comfort measures Outcome: Progressing   Problem: Clinical Measurements: Goal: Respiratory complications will improve Outcome: Progressing   Problem: Safety: Goal: Ability to remain free from injury will improve Outcome: Progressing

## 2022-12-13 NOTE — Progress Notes (Signed)
PROGRESS NOTE  Madison Decker YNW:[id number] DOB: [birth year]   PCP: Rica Records, FNP  Patient is from: Home  DOA: 12/10/2022 LOS: 3  Chief complaints Chief Complaint  Patient presents with   Cough     Brief Narrative / Interim history: 32 year old F with PMH of allergy/angioedema and recent admission in 10/2022 with multifocal pulmonary infiltrate and lymphadenopathy and treated with antibiotics and a steroid and discharged home to follow-up with pulmonology.  She presents with progressive dyspnea and cough for about a week.  She also had associated weight loss, fatigue, generalized weakness, extremity swelling and numbness.  CT angio chest negative for PE but worsening multifocal infiltrate and mild increased severity of bilateral healer LAD.  Admitted with working diagnosis of pneumonia/inflammatory lung disease.  Started on antibiotics and Solu-Medrol.  PCCM consulted, and planning bronchoscopy.   Patient had extensive workup with echocardiogram, autoimmune/serology labs unrevealing prior hospitalization.    Subjective: Seen and examined earlier this morning.  No major events overnight or this morning. No complaints.   Objective: Vitals:   12/12/22 1100 12/12/22 1240 12/12/22 2042 12/13/22 0514  BP:  118/73 116/80 112/72  Pulse:  99 95 70  Resp:  18 20 20   Temp:  98.8 F (37.1 C) 98.6 F (37 C) 97.8 F (36.6 C)  TempSrc:  Oral Oral Oral  SpO2: (!) 89% 98% 97% 97%  Weight:      Height:        Examination:  GENERAL: No apparent distress.  Nontoxic. HEENT: MMM.  Vision and hearing grossly intact.  NECK: Supple.  No apparent JVD.  RESP:  No IWOB.  Crackles globally. CVS:  RRR. Heart sounds normal.  ABD/GI/GU: BS+. Abd soft, NTND.  MSK/EXT:  Moves extremities. No apparent deformity. No edema.  SKIN: no apparent skin lesion or wound NEURO: Awake, alert and oriented appropriately.  No apparent focal neuro deficit. PSYCH: Calm. Normal affect.   Procedures:   None  Microbiology summarized: COVID-19, influenza and RSV PCR nonreactive Blood cultures NGTD MRSA PCR screen negative  Assessment and plan: Principal Problem:   Acute respiratory failure with hypoxia (HCC)  Acute hypoxic respiratory failure due to multifocal pulmonary infiltrate: Per EDP note, O2 sat was 73% on RA on arrival to ED, and only improved to 82% on 2 L.  Currently on 6 L by nasal cannula saturating in upper 90s.  CT angio chest negative for PE but worsening infiltrate and healer lymphadenopathy.  No leukocytosis.  Pro-Cal negative.  Patient had normal TTE, serology and autoimmune workup..  Symptoms improving with antibiotic and steroid.   -Appreciate PCCM input-bronchoscopy at some point -Continue high-dose Solu-Medrol, CTX and Zithromax -Wean oxygen as able.  Incentive telemetry, OOB and ambulatory saturation -Seems she has follow-up with rheumatology, Dr. Corliss Skains on 05/04/2023    Globus sensation, postprandial emesis: At risk of esophageal stenosis.  Esophagram without significant finding other than some reflux signs. 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  B12 397. -Has f/u with Dr. Terrace Arabia, neurology, 02/26/2023 -Continue gabapentin   Sinus tachycardia: TSH 1.750.  Resolved. - Pt has been referred to cardiology, Dr. Jenene Slicker (appt 10/3), though I suspect this is secondary to other conditions.   Prediabetes: HbA1c 6.1%.  -CBGs AC/HS, sensitive SSI for steroid induced hyperglycemia.   Hypokalemia -Monitor replenish as appropriate  Hyponatremia: Mild -Continue monitoring  Body mass index is 29.02 kg/m.           DVT prophylaxis:  heparin injection 5,000 Units Start: 12/11/22 0600 SCDs Start: 12/11/22 0045  Code Status: Full code Family Communication: Updated patient's sister at bedside Level of care: Med-Surg Status is: Inpatient Remains inpatient appropriate because: Respiratory  failure   Final disposition: Home Consultants:  Pulmonology  35 minutes with more than 50% spent in reviewing records, counseling patient/family and coordinating care.   Sch Meds:  Scheduled Meds:  Chlorhexidine Gluconate Cloth  6 each Topical Daily   gabapentin  100 mg Oral TID   heparin  5,000 Units Subcutaneous Q8H   methylPREDNISolone (SOLU-MEDROL) injection  125 mg Intravenous Daily   pantoprazole (PROTONIX) IV  40 mg Intravenous Q24H   [START ON 12/14/2022] pneumococcal 20-valent conjugate vaccine  0.5 mL Intramuscular Tomorrow-1000   Continuous Infusions:  azithromycin 500 mg (12/12/22 2145)   cefTRIAXone (ROCEPHIN)  IV 2 g (12/12/22 2043)   PRN Meds:.acetaminophen **OR** acetaminophen, ondansetron **OR** ondansetron (ZOFRAN) IV, oxyCODONE  Antimicrobials: Anti-infectives (From admission, onward)    Start     Dose/Rate Route Frequency Ordered Stop   12/10/22 2000  cefTRIAXone (ROCEPHIN) 2 g in sodium chloride 0.9 % 100 mL IVPB        2 g 200 mL/hr over 30 Minutes Intravenous Every 24 hours 12/10/22 1957 12/15/22 1959   12/10/22 2000  azithromycin (ZITHROMAX) 500 mg in sodium chloride 0.9 % 250 mL IVPB        500 mg 250 mL/hr over 60 Minutes Intravenous Every 24 hours 12/10/22 1957 12/15/22 1959        I have personally reviewed the following labs and images: CBC: Recent Labs  Lab 12/07/22 1058 12/10/22 2024 12/11/22 0309 12/12/22 1122  WBC 6.0 5.5 6.1 4.6  NEUTROABS 4.3 2.9 3.6  --   HGB 14.0 13.5 11.5* 12.3  HCT 44.3 42.7 36.4 39.5  MCV 89 89.7 88.3 91.0  PLT 314 318 298 272   BMP &GFR Recent Labs  Lab 12/07/22 1058 12/10/22 2024 12/11/22 0309 12/12/22 1122 12/13/22 0814  NA 139 134* 134* 137 138  K 3.4* 2.9* 2.8* 3.2* 3.5  CL 101 97* 102 102 103  CO2 24 28 26 25 28   GLUCOSE 90 97 112* 126* 107*  BUN 7 8 6 9 10   CREATININE 0.62 0.64 0.56 0.54 0.48  CALCIUM 8.9 8.1* 7.9* 8.2* 8.2*  MG  --   --   --  2.2 2.4  PHOS  --   --   --   --  4.2    Estimated Creatinine Clearance: 103.1 mL/min (by C-G formula based on SCr of 0.48 mg/dL). Liver & Pancreas: Recent Labs  Lab 12/07/22 1058 12/10/22 2024 12/11/22 0309 12/12/22 1122 12/13/22 0814  AST 46* 50* 41 41  --   ALT 13 18 15 15   --   ALKPHOS 72 51 42 46  --   BILITOT 0.5 0.8 0.5 0.7  --   PROT 7.4 8.0 6.6 7.3  --   ALBUMIN 3.6* 3.0* 2.5* 2.8* 2.7*   No results for input(s): "LIPASE", "AMYLASE" in the last 168 hours. No results for input(s): "AMMONIA" in the last 168 hours. Diabetic: No results for input(s): "HGBA1C" in the last 72 hours. No results for input(s): "GLUCAP" in the last 168 hours. Cardiac Enzymes: No results for input(s): "CKTOTAL", "CKMB", "CKMBINDEX", "TROPONINI" in the last 168 hours. No results for input(s): "PROBNP" in the last 8760 hours. Coagulation Profile: Recent Labs  Lab 12/10/22 2024  INR 1.0   Thyroid Function Tests: No results for input(s): "  TSH", "T4TOTAL", "FREET4", "T3FREE", "THYROIDAB" in the last 72 hours. Lipid Profile: No results for input(s): "CHOL", "HDL", "LDLCALC", "TRIG", "CHOLHDL", "LDLDIRECT" in the last 72 hours. Anemia Panel: No results for input(s): "VITAMINB12", "FOLATE", "FERRITIN", "TIBC", "IRON", "RETICCTPCT" in the last 72 hours. Urine analysis:    Component Value Date/Time   COLORURINE YELLOW 12/11/2022 0050   APPEARANCEUR CLEAR 12/11/2022 0050   LABSPEC 1.010 12/11/2022 0050   PHURINE 7.0 12/11/2022 0050   GLUCOSEU NEGATIVE 12/11/2022 0050   HGBUR NEGATIVE 12/11/2022 0050   BILIRUBINUR NEGATIVE 12/11/2022 0050   KETONESUR 15 (A) 12/11/2022 0050   PROTEINUR 30 (A) 12/11/2022 0050   NITRITE NEGATIVE 12/11/2022 0050   LEUKOCYTESUR NEGATIVE 12/11/2022 0050   Sepsis Labs: Invalid input(s): "PROCALCITONIN", "LACTICIDVEN"  Microbiology: Recent Results (from the past 240 hour(s))  Resp panel by RT-PCR (RSV, Flu A&B, Covid) Anterior Nasal Swab     Status: None   Collection Time: 12/10/22  8:23 PM    Specimen: Anterior Nasal Swab  Result Value Ref Range Status   SARS Coronavirus 2 by RT PCR NEGATIVE NEGATIVE Final    Comment: (NOTE) SARS-CoV-2 target nucleic acids are NOT DETECTED.  The SARS-CoV-2 RNA is generally detectable in upper respiratory specimens during the acute phase of infection. The lowest concentration of SARS-CoV-2 viral copies this assay can detect is 138 copies/mL. A negative result does not preclude SARS-Cov-2 infection and should not be used as the sole basis for treatment or other patient management decisions. A negative result may occur with  improper specimen collection/handling, submission of specimen other than nasopharyngeal swab, presence of viral mutation(s) within the areas targeted by this assay, and inadequate number of viral copies(<138 copies/mL). A negative result must be combined with clinical observations, patient history, and epidemiological information. The expected result is Negative.  Fact Sheet for Patients:  BloggerCourse.com  Fact Sheet for Healthcare Providers:  SeriousBroker.it  This test is no t yet approved or cleared by the [place] FDA and  has been authorized for detection and/or diagnosis of SARS-CoV-2 by FDA under an Emergency Use Authorization (EUA). This EUA will remain  in effect (meaning this test can be used) for the duration of the COVID-19 declaration under Section 564(b)(1) of the Act, 21 U.S.C.section 360bbb-3(b)(1), unless the authorization is terminated  or revoked sooner.       Influenza A by PCR NEGATIVE NEGATIVE Final   Influenza B by PCR NEGATIVE NEGATIVE Final    Comment: (NOTE) The Xpert Xpress SARS-CoV-2/FLU/RSV plus assay is intended as an aid in the diagnosis of influenza from Nasopharyngeal swab specimens and should not be used as a sole basis for treatment. Nasal washings and aspirates are unacceptable for Xpert Xpress  SARS-CoV-2/FLU/RSV testing.  Fact Sheet for Patients: BloggerCourse.com  Fact Sheet for Healthcare Providers: SeriousBroker.it  This test is not yet approved or cleared by the [place] FDA and has been authorized for detection and/or diagnosis of SARS-CoV-2 by FDA under an Emergency Use Authorization (EUA). This EUA will remain in effect (meaning this test can be used) for the duration of the COVID-19 declaration under Section 564(b)(1) of the Act, 21 U.S.C. section 360bbb-3(b)(1), unless the authorization is terminated or revoked.     Resp Syncytial Virus by PCR NEGATIVE NEGATIVE Final    Comment: (NOTE) Fact Sheet for Patients: BloggerCourse.com  Fact Sheet for Healthcare Providers: SeriousBroker.it  This test is not yet approved or cleared by the [place] FDA and has been authorized for detection and/or diagnosis of SARS-CoV-2 by  FDA under an Emergency Use Authorization (EUA). This EUA will remain in effect (meaning this test can be used) for the duration of the COVID-19 declaration under Section 564(b)(1) of the Act, 21 U.S.C. section 360bbb-3(b)(1), unless the authorization is terminated or revoked.  Performed at [facility], [address], [place], [place] [address]   Blood Culture (routine x 2)     Status: None (Preliminary result)   Collection Time: 12/10/22  8:24 PM   Specimen: BLOOD  Result Value Ref Range Status   Specimen Description BLOOD RIGHT ANTECUBITAL  Final   Special Requests   Final    BOTTLES DRAWN AEROBIC AND ANAEROBIC Blood Culture adequate volume   Culture   Final    NO GROWTH 3 DAYS Performed at [facility], [address], [place], [place] [address]    Report Status PENDING  Incomplete  Blood Culture (routine x 2)     Status: None (Preliminary result)   Collection Time: 12/10/22  8:40 PM   Specimen: Left Antecubital; Blood  Result  Value Ref Range Status   Specimen Description LEFT ANTECUBITAL  Final   Special Requests   Final    BOTTLES DRAWN AEROBIC AND ANAEROBIC Blood Culture adequate volume   Culture   Final    NO GROWTH 3 DAYS Performed at [facility], [address] [place], [place] [address]    Report Status PENDING  Incomplete  MRSA Next Gen by PCR, Nasal     Status: None   Collection Time: 12/11/22  3:10 PM   Specimen: Nasal Mucosa; Nasal Swab  Result Value Ref Range Status   MRSA by PCR Next Gen NOT DETECTED NOT DETECTED Final    Comment: (NOTE) The GeneXpert MRSA Assay (FDA approved for NASAL specimens only), is one component of a comprehensive MRSA colonization surveillance program. It is not intended to diagnose MRSA infection nor to guide or monitor treatment for MRSA infections. Test performance is not FDA approved in patients less than 30 years old. Performed at [facility], [address] [place], [place] [address]     Radiology Studies: No results found.    Reymundo Winship [facility]  If 7PM-7AM, please contact night-coverage [email or web address] 12/13/2022, 12:08 PM

## 2022-12-14 DIAGNOSIS — J9601 Acute respiratory failure with hypoxia: Secondary | ICD-10-CM | POA: Diagnosis not present

## 2022-12-14 LAB — RENAL FUNCTION PANEL
Albumin: 2.6 g/dL — ABNORMAL LOW (ref 3.5–5.0)
Anion gap: 7 (ref 5–15)
BUN: 11 mg/dL (ref 6–20)
CO2: 25 mmol/L (ref 22–32)
Calcium: 8.1 mg/dL — ABNORMAL LOW (ref 8.9–10.3)
Chloride: 103 mmol/L (ref 98–111)
Creatinine, Ser: 0.45 mg/dL (ref 0.44–1.00)
GFR, Estimated: >60 mL/min (ref >60)
Glucose, Bld: 124 mg/dL — ABNORMAL HIGH (ref 70–99)
Phosphorus: 4.1 mg/dL (ref 2.5–4.6)
Potassium: 3.8 mmol/L (ref 3.5–5.1)
Sodium: 135 mmol/L (ref 135–145)

## 2022-12-14 LAB — CBC
HCT: 37.8 % (ref 36.0–46.0)
Hemoglobin: 11.8 g/dL — ABNORMAL LOW (ref 12.0–15.0)
MCH: 28.2 pg (ref 26.0–34.0)
MCHC: 31.2 g/dL (ref 30.0–36.0)
MCV: 90.4 fL (ref 80.0–100.0)
Platelets: 240 10*3/uL (ref 150–400)
RBC: 4.18 MIL/uL (ref 3.87–5.11)
RDW: 14.6 % (ref 11.5–15.5)
WBC: 7.3 10*3/uL (ref 4.0–10.5)
nRBC: 0 % (ref 0.0–0.2)

## 2022-12-14 LAB — LEGIONELLA PNEUMOPHILA SEROGP 1 UR AG: L. pneumophila Serogp 1 Ur Ag: NEGATIVE

## 2022-12-14 LAB — MAGNESIUM: Magnesium: 2.5 mg/dL — ABNORMAL HIGH (ref 1.7–2.4)

## 2022-12-14 NOTE — Plan of Care (Signed)
Messaged by endo Pt for EBUS 12/15/22 with Dr. Katrinka Blazing PCCM  Orders placed for NPO at midnight   Tessie Fass MSN, AGACNP-BC [facility] Pulmonary/Critical Care Medicine 12/14/2022, 11:42 AM

## 2022-12-14 NOTE — Progress Notes (Signed)
Mobility Specialist - Progress Note  Pre-mobility: 109 bpm HR, 89% SpO2 During mobility: 145 bpm HR, 79% SpO2 Post-mobility: 138 bpm HR, 79% SPO2   12/14/22 1112  Oxygen Therapy  O2 Device Nasal Cannula  O2 Flow Rate (L/min) 4 L/min  Patient Activity (if Appropriate) Ambulating  Mobility  Activity Ambulated independently in hallway  Level of Assistance Standby assist, set-up cues, supervision of patient - no hands on  Assistive Device None  Distance Ambulated (ft) 150 ft  Range of Motion/Exercises Active  $Mobility charge 1 Mobility  Mobility Specialist Start Time (ACUTE ONLY) 1050  Mobility Specialist Stop Time (ACUTE ONLY) 1112  Mobility Specialist Time Calculation (min) (ACUTE ONLY) 22 min   Pt was found in bed and agreeable to ambulate. Stated not having any lightheadedness, no dizziness, no SOB with session. Pt upon returning to room requested to use bathroom. After bathroom SPO2 checked to be 65%. After ~2min SPO2 increased to 79%. RN notified. Pt to bed with all needs met. Call bell in reach and mother in room.  Billey Chang Mobility Specialist

## 2022-12-14 NOTE — Progress Notes (Signed)
PROGRESS NOTE  Madison Decker AOZ:[id number] DOB: 20-Nov-1990   PCP: Rica Records, FNP  Patient is from: Home  DOA: 12/10/2022 LOS: 4  Chief complaints Chief Complaint  Patient presents with   Cough     Brief Narrative / Interim history: 32 year old F with PMH of allergy/angioedema and recent admission in 10/2022 with multifocal pulmonary infiltrate and lymphadenopathy and treated with antibiotics and a steroid and discharged home to follow-up with pulmonology.  She presents with progressive dyspnea and cough for about a week.  She also had associated weight loss, fatigue, generalized weakness, extremity swelling and numbness.  CT angio chest negative for PE but worsening multifocal infiltrate and mild increased severity of bilateral healer LAD.  Admitted with working diagnosis of pneumonia/inflammatory lung disease.  Started on antibiotics and Solu-Medrol.   Patient had extensive workup with echocardiogram, autoimmune/serology labs unrevealing prior hospitalization.  PCCM consulted, and planning EBUS on 9/3.   Subjective: Seen and examined earlier this morning.  No major events overnight of this morning.  No complaints.  Patient's mother at bedside.  Objective: Vitals:   12/13/22 1228 12/13/22 1648 12/13/22 2117 12/14/22 0546  BP: 117/80  120/79 125/81  Pulse: (!) 102  97 65  Resp: 20  17 17   Temp: 98.2 F (36.8 C)  98.2 F (36.8 C) 97.6 F (36.4 C)  TempSrc: Oral  Oral Oral  SpO2: 97% (!) 83% 100% 98%  Weight:      Height:        Examination:  GENERAL: No apparent distress.  Nontoxic. HEENT: MMM.  Vision and hearing grossly intact.  NECK: Supple.  No apparent JVD.  RESP:  No IWOB.  Crackles globally. CVS:  RRR. Heart sounds normal.  ABD/GI/GU: BS+. Abd soft, NTND.  MSK/EXT:  Moves extremities. No apparent deformity. No edema.  SKIN: Areas of skin lesion with hyperpigmentation on the back of the neck, right upper arm... NEURO: Awake, alert and oriented  appropriately.  No apparent focal neuro deficit. PSYCH: Calm. Normal affect.   Procedures:  None  Microbiology summarized: COVID-19, influenza and RSV PCR nonreactive Blood cultures NGTD MRSA PCR screen negative  Assessment and plan: Principal Problem:   Acute respiratory failure with hypoxia (HCC)  Acute hypoxic respiratory failure due to multifocal pulmonary infiltrate: Per EDP note, O2 sat was 73% on RA on arrival to ED and required 6 L.  CT angio chest negative for PE but worsening infiltrate and healer lymphadenopathy.  No leukocytosis.  Pro-Cal negative.  Patient had normal TTE, serology and autoimmune workup..  Symptoms improving with antibiotic and steroid.  Currently saturating in upper 90s on 4 L. -Appreciate PCCM input-plan for EBUS on 9/3. -Continue high-dose Solu-Medrol, CTX and Zithromax -Wean oxygen as able.  Incentive telemetry, OOB and ambulatory saturation -Seems she has follow-up with rheumatology, Dr. Corliss Skains on 05/04/2023    Globus sensation, postprandial emesis: At risk of esophageal stenosis.  Esophagram without significant finding other than some reflux signs. 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  B12 397. -Has f/u with Dr. Terrace Arabia, neurology, 02/26/2023 -Continue gabapentin   Sinus tachycardia: TSH 1.750.  Resolved. - Pt has been referred to cardiology, Dr. Jenene Slicker (appt 10/3), though I suspect this is secondary to other conditions.   Prediabetes: HbA1c 6.1%.  -CBGs AC/HS, sensitive SSI for steroid induced hyperglycemia.   Hypokalemia -Monitor replenish as appropriate  Hyponatremia: Mild -Continue monitoring  Skin lesion: Scattered areas of skin lesion over the posterior lower  part of her neck, left upper arm, lower back.  No signs of infection or ulceration.  Per patient and family, spontaneously emerged over the last few months.  No pruritus.  Usually improves with steroid cream.   Autoimmune? -Punch biopsy?  Body mass index is 29.02 kg/m.           DVT prophylaxis:  heparin injection 5,000 Units Start: 12/11/22 0600 SCDs Start: 12/11/22 0045  Code Status: Full code Family Communication: Updated patient's mother at bedside. Level of care: Med-Surg Status is: Inpatient Remains inpatient appropriate because: Respiratory failure   Final disposition: Home Consultants:  Pulmonology  35 minutes with more than 50% spent in reviewing records, counseling patient/family and coordinating care.   Sch Meds:  Scheduled Meds:  Chlorhexidine Gluconate Cloth  6 each Topical Daily   gabapentin  100 mg Oral TID   heparin  5,000 Units Subcutaneous Q8H   methylPREDNISolone (SOLU-MEDROL) injection  80 mg Intravenous Daily   pantoprazole (PROTONIX) IV  40 mg Intravenous Q24H   pneumococcal 20-valent conjugate vaccine  0.5 mL Intramuscular Tomorrow-1000   Continuous Infusions:  azithromycin 500 mg (12/13/22 2002)   cefTRIAXone (ROCEPHIN)  IV 2 g (12/13/22 2217)   PRN Meds:.acetaminophen **OR** acetaminophen, ondansetron **OR** ondansetron (ZOFRAN) IV, oxyCODONE  Antimicrobials: Anti-infectives (From admission, onward)    Start     Dose/Rate Route Frequency Ordered Stop   12/10/22 2000  cefTRIAXone (ROCEPHIN) 2 g in sodium chloride 0.9 % 100 mL IVPB        2 g 200 mL/hr over 30 Minutes Intravenous Every 24 hours 12/10/22 1957 12/15/22 1959   12/10/22 2000  azithromycin (ZITHROMAX) 500 mg in sodium chloride 0.9 % 250 mL IVPB        500 mg 250 mL/hr over 60 Minutes Intravenous Every 24 hours 12/10/22 1957 12/15/22 1959        I have personally reviewed the following labs and images: CBC: Recent Labs  Lab 12/10/22 2024 12/11/22 0309 12/12/22 1122 12/14/22 0351  WBC 5.5 6.1 4.6 7.3  NEUTROABS 2.9 3.6  --   --   HGB 13.5 11.5* 12.3 11.8*  HCT 42.7 36.4 39.5 37.8  MCV 89.7 88.3 91.0 90.4  PLT 318 298 272 240   BMP &GFR Recent Labs  Lab  12/10/22 2024 12/11/22 0309 12/12/22 1122 12/13/22 0814 12/14/22 0351  NA 134* 134* 137 138 135  K 2.9* 2.8* 3.2* 3.5 3.8  CL 97* 102 102 103 103  CO2 28 26 25 28 25   GLUCOSE 97 112* 126* 107* 124*  BUN 8 6 9 10 11   CREATININE 0.64 0.56 0.54 0.48 0.45  CALCIUM 8.1* 7.9* 8.2* 8.2* 8.1*  MG  --   --  2.2 2.4 2.5*  PHOS  --   --   --  4.2 4.1   Estimated Creatinine Clearance: 103.1 mL/min (by C-G formula based on SCr of 0.45 mg/dL). Liver & Pancreas: Recent Labs  Lab 12/10/22 2024 12/11/22 0309 12/12/22 1122 12/13/22 0814 12/14/22 0351  AST 50* 41 41  --   --   ALT 18 15 15   --   --   ALKPHOS 51 42 46  --   --   BILITOT 0.8 0.5 0.7  --   --   PROT 8.0 6.6 7.3  --   --   ALBUMIN 3.0* 2.5* 2.8* 2.7* 2.6*   No results for input(s): "LIPASE", "AMYLASE" in the last 168 hours. No results for input(s): "AMMONIA" in the last 168 hours.  Diabetic: No results for input(s): "HGBA1C" in the last 72 hours. No results for input(s): "GLUCAP" in the last 168 hours. Cardiac Enzymes: No results for input(s): "CKTOTAL", "CKMB", "CKMBINDEX", "TROPONINI" in the last 168 hours. No results for input(s): "PROBNP" in the last 8760 hours. Coagulation Profile: Recent Labs  Lab 12/10/22 2024  INR 1.0   Thyroid Function Tests: No results for input(s): "TSH", "T4TOTAL", "FREET4", "T3FREE", "THYROIDAB" in the last 72 hours. Lipid Profile: No results for input(s): "CHOL", "HDL", "LDLCALC", "TRIG", "CHOLHDL", "LDLDIRECT" in the last 72 hours. Anemia Panel: No results for input(s): "VITAMINB12", "FOLATE", "FERRITIN", "TIBC", "IRON", "RETICCTPCT" in the last 72 hours. Urine analysis:    Component Value Date/Time   COLORURINE YELLOW 12/11/2022 0050   APPEARANCEUR CLEAR 12/11/2022 0050   LABSPEC 1.010 12/11/2022 0050   PHURINE 7.0 12/11/2022 0050   GLUCOSEU NEGATIVE 12/11/2022 0050   HGBUR NEGATIVE 12/11/2022 0050   BILIRUBINUR NEGATIVE 12/11/2022 0050   KETONESUR 15 (A) 12/11/2022 0050    PROTEINUR 30 (A) 12/11/2022 0050   NITRITE NEGATIVE 12/11/2022 0050   LEUKOCYTESUR NEGATIVE 12/11/2022 0050   Sepsis Labs: Invalid input(s): "PROCALCITONIN", "LACTICIDVEN"  Microbiology: Recent Results (from the past 240 hour(s))  Resp panel by RT-PCR (RSV, Flu A&B, Covid) Anterior Nasal Swab     Status: None   Collection Time: 12/10/22  8:23 PM   Specimen: Anterior Nasal Swab  Result Value Ref Range Status   SARS Coronavirus 2 by RT PCR NEGATIVE NEGATIVE Final    Comment: (NOTE) SARS-CoV-2 target nucleic acids are NOT DETECTED.  The SARS-CoV-2 RNA is generally detectable in upper respiratory specimens during the acute phase of infection. The lowest concentration of SARS-CoV-2 viral copies this assay can detect is 138 copies/mL. A negative result does not preclude SARS-Cov-2 infection and should not be used as the sole basis for treatment or other patient management decisions. A negative result may occur with  improper specimen collection/handling, submission of specimen other than nasopharyngeal swab, presence of viral mutation(s) within the areas targeted by this assay, and inadequate number of viral copies(<138 copies/mL). A negative result must be combined with clinical observations, patient history, and epidemiological information. The expected result is Negative.  Fact Sheet for Patients:  BloggerCourse.com  Fact Sheet for Healthcare Providers:  SeriousBroker.it  This test is no t yet approved or cleared by the [place] FDA and  has been authorized for detection and/or diagnosis of SARS-CoV-2 by FDA under an Emergency Use Authorization (EUA). This EUA will remain  in effect (meaning this test can be used) for the duration of the COVID-19 declaration under Section 564(b)(1) of the Act, 21 U.S.C.section 360bbb-3(b)(1), unless the authorization is terminated  or revoked sooner.       Influenza A by PCR NEGATIVE  NEGATIVE Final   Influenza B by PCR NEGATIVE NEGATIVE Final    Comment: (NOTE) The Xpert Xpress SARS-CoV-2/FLU/RSV plus assay is intended as an aid in the diagnosis of influenza from Nasopharyngeal swab specimens and should not be used as a sole basis for treatment. Nasal washings and aspirates are unacceptable for Xpert Xpress SARS-CoV-2/FLU/RSV testing.  Fact Sheet for Patients: BloggerCourse.com  Fact Sheet for Healthcare Providers: SeriousBroker.it  This test is not yet approved or cleared by the [place] FDA and has been authorized for detection and/or diagnosis of SARS-CoV-2 by FDA under an Emergency Use Authorization (EUA). This EUA will remain in effect (meaning this test can be used) for the duration of the COVID-19 declaration under Section 564(b)(1) of the  Act, 21 U.S.C. section 360bbb-3(b)(1), unless the authorization is terminated or revoked.     Resp Syncytial Virus by PCR NEGATIVE NEGATIVE Final    Comment: (NOTE) Fact Sheet for Patients: BloggerCourse.com  Fact Sheet for Healthcare Providers: SeriousBroker.it  This test is not yet approved or cleared by the [place] FDA and has been authorized for detection and/or diagnosis of SARS-CoV-2 by FDA under an Emergency Use Authorization (EUA). This EUA will remain in effect (meaning this test can be used) for the duration of the COVID-19 declaration under Section 564(b)(1) of the Act, 21 U.S.C. section 360bbb-3(b)(1), unless the authorization is terminated or revoked.  Performed at [facility], [address], [place], [place] [address]   Blood Culture (routine x 2)     Status: None (Preliminary result)   Collection Time: 12/10/22  8:24 PM   Specimen: BLOOD  Result Value Ref Range Status   Specimen Description BLOOD RIGHT ANTECUBITAL  Final   Special Requests   Final    BOTTLES DRAWN AEROBIC AND  ANAEROBIC Blood Culture adequate volume   Culture   Final    NO GROWTH 4 DAYS Performed at [facility], [address], [place], [place] [address]    Report Status PENDING  Incomplete  Blood Culture (routine x 2)     Status: None (Preliminary result)   Collection Time: 12/10/22  8:40 PM   Specimen: Left Antecubital; Blood  Result Value Ref Range Status   Specimen Description LEFT ANTECUBITAL  Final   Special Requests   Final    BOTTLES DRAWN AEROBIC AND ANAEROBIC Blood Culture adequate volume   Culture   Final    NO GROWTH 4 DAYS Performed at [facility], [address], [place], [place] [address]    Report Status PENDING  Incomplete  MRSA Next Gen by PCR, Nasal     Status: None   Collection Time: 12/11/22  3:10 PM   Specimen: Nasal Mucosa; Nasal Swab  Result Value Ref Range Status   MRSA by PCR Next Gen NOT DETECTED NOT DETECTED Final    Comment: (NOTE) The GeneXpert MRSA Assay (FDA approved for NASAL specimens only), is one component of a comprehensive MRSA colonization surveillance program. It is not intended to diagnose MRSA infection nor to guide or monitor treatment for MRSA infections. Test performance is not FDA approved in patients less than 40 years old. Performed at [facility], [address] [place], [place] [address]     Radiology Studies: No results found.    Marda Breidenbach T. Offie Waide [facility]  If 7PM-7AM, please contact night-coverage [email or web address] 12/14/2022, 1:39 PM

## 2022-12-14 NOTE — Plan of Care (Signed)
  Problem: Education: Goal: Knowledge of General Education information will improve Description: Including pain rating scale, medication(s)/side effects and non-pharmacologic comfort measures Outcome: Progressing   Problem: Clinical Measurements: Goal: Diagnostic test results will improve Outcome: Progressing Goal: Respiratory complications will improve Outcome: Progressing   Problem: Activity: Goal: Risk for activity intolerance will decrease Outcome: Progressing   Problem: Safety: Goal: Ability to remain free from injury will improve Outcome: Progressing   

## 2022-12-15 ENCOUNTER — Inpatient Hospital Stay (HOSPITAL_COMMUNITY): Payer: Medicaid Other

## 2022-12-15 ENCOUNTER — Inpatient Hospital Stay (HOSPITAL_COMMUNITY): Payer: Medicaid Other | Admitting: Anesthesiology

## 2022-12-15 ENCOUNTER — Encounter (HOSPITAL_COMMUNITY): Payer: Self-pay | Admitting: Family Medicine

## 2022-12-15 ENCOUNTER — Encounter (HOSPITAL_COMMUNITY)
Admission: EM | Disposition: A | Discharge status: Home / Self Care | Payer: Self-pay | Source: Home / Self Care | Attending: Student

## 2022-12-15 DIAGNOSIS — J849 Interstitial pulmonary disease, unspecified: Secondary | ICD-10-CM | POA: Diagnosis not present

## 2022-12-15 DIAGNOSIS — J9601 Acute respiratory failure with hypoxia: Secondary | ICD-10-CM | POA: Diagnosis not present

## 2022-12-15 DIAGNOSIS — R591 Generalized enlarged lymph nodes: Secondary | ICD-10-CM | POA: Diagnosis not present

## 2022-12-15 HISTORY — PX: BRONCHIAL WASHINGS: SHX5105

## 2022-12-15 HISTORY — PX: BRONCHIAL NEEDLE ASPIRATION BIOPSY: SHX5106

## 2022-12-15 HISTORY — PX: VIDEO BRONCHOSCOPY: SHX5072

## 2022-12-15 HISTORY — PX: ENDOBRONCHIAL ULTRASOUND: SHX5096

## 2022-12-15 HISTORY — PX: BRONCHIAL BRUSHINGS: SHX5108

## 2022-12-15 LAB — BODY FLUID CELL COUNT WITH DIFFERENTIAL
Eos, Fluid: 0 %
Lymphs, Fluid: 31 %
Monocyte-Macrophage-Serous Fluid: 66 % (ref 50–90)
Neutrophil Count, Fluid: 3 % (ref 0–25)
Total Nucleated Cell Count, Fluid: 32 uL (ref 0–1000)

## 2022-12-15 LAB — CULTURE, BLOOD (ROUTINE X 2)
Culture: NO GROWTH
Culture: NO GROWTH
Special Requests: ADEQUATE
Special Requests: ADEQUATE

## 2022-12-15 SURGERY — ENDOBRONCHIAL ULTRASOUND (EBUS)
Anesthesia: General

## 2022-12-15 MED ORDER — MIDAZOLAM HCL 2 MG/2ML IJ SOLN
INTRAMUSCULAR | Status: AC
  Filled 2022-12-15: qty 2

## 2022-12-15 MED ORDER — OXYCODONE HCL 5 MG/5ML PO SOLN: 5.0000 mg | Freq: Once | ORAL | Status: DC | PRN

## 2022-12-15 MED ORDER — DEXAMETHASONE SODIUM PHOSPHATE 10 MG/ML IJ SOLN
INTRAMUSCULAR | Status: DC | PRN
  Administered 2022-12-15: 10 mg via INTRAVENOUS

## 2022-12-15 MED ORDER — HYDROMORPHONE HCL 1 MG/ML IJ SOLN: 0.2500 mg | INTRAMUSCULAR | Status: DC | PRN

## 2022-12-15 MED ORDER — ONDANSETRON HCL 4 MG/2ML IJ SOLN
INTRAMUSCULAR | Status: DC | PRN
  Administered 2022-12-15: 4 mg via INTRAVENOUS

## 2022-12-15 MED ORDER — MENTHOL 3 MG MT LOZG
1.0000 | LOZENGE | OROMUCOSAL | Status: DC | PRN
  Administered 2022-12-15 – 2022-12-16 (×2): 3 mg via ORAL
  Filled 2022-12-15 (×2): qty 9

## 2022-12-15 MED ORDER — LIDOCAINE 2% (20 MG/ML) 5 ML SYRINGE
INTRAMUSCULAR | Status: DC | PRN
  Administered 2022-12-15: 100 mg via INTRAVENOUS

## 2022-12-15 MED ORDER — SUCCINYLCHOLINE CHLORIDE 200 MG/10ML IV SOSY
PREFILLED_SYRINGE | INTRAVENOUS | Status: DC | PRN
  Administered 2022-12-15: 120 mg via INTRAVENOUS

## 2022-12-15 MED ORDER — FENTANYL CITRATE (PF) 100 MCG/2ML IJ SOLN
INTRAMUSCULAR | Status: AC
  Filled 2022-12-15: qty 2

## 2022-12-15 MED ORDER — OXYCODONE HCL 5 MG PO TABS: 5.0000 mg | ORAL_TABLET | Freq: Once | ORAL | Status: DC | PRN

## 2022-12-15 MED ORDER — SUGAMMADEX SODIUM 200 MG/2ML IV SOLN
INTRAVENOUS | Status: DC | PRN
  Administered 2022-12-15: 180 mg via INTRAVENOUS

## 2022-12-15 MED ORDER — MEPERIDINE HCL 50 MG/ML IJ SOLN: 6.2500 mg | INTRAMUSCULAR | Status: DC | PRN

## 2022-12-15 MED ORDER — LACTATED RINGERS IV SOLN
INTRAVENOUS | Status: AC | PRN
  Administered 2022-12-15: 1000 mL via INTRAVENOUS

## 2022-12-15 MED ORDER — ROCURONIUM BROMIDE 10 MG/ML (PF) SYRINGE
PREFILLED_SYRINGE | INTRAVENOUS | Status: DC | PRN
  Administered 2022-12-15: 30 mg via INTRAVENOUS

## 2022-12-15 MED ORDER — PROMETHAZINE HCL 25 MG/ML IJ SOLN: 6.2500 mg | INTRAMUSCULAR | Status: DC | PRN

## 2022-12-15 MED ORDER — PROPOFOL 10 MG/ML IV BOLUS
INTRAVENOUS | Status: DC | PRN
  Administered 2022-12-15: 150 mg via INTRAVENOUS

## 2022-12-15 MED ORDER — FENTANYL CITRATE (PF) 100 MCG/2ML IJ SOLN
INTRAMUSCULAR | Status: DC | PRN
  Administered 2022-12-15: 100 ug via INTRAVENOUS

## 2022-12-15 MED ORDER — PHENOL 1.4 % MT LIQD: 1.0000 | OROMUCOSAL | Status: DC | PRN

## 2022-12-15 MED ORDER — AMISULPRIDE (ANTIEMETIC) 5 MG/2ML IV SOLN: 10.0000 mg | Freq: Once | INTRAVENOUS | Status: DC | PRN

## 2022-12-15 MED ORDER — PROPOFOL 10 MG/ML IV BOLUS
INTRAVENOUS | Status: AC
  Filled 2022-12-15: qty 20

## 2022-12-15 NOTE — Op Note (Signed)
Flexible and EBUS Bronchoscopy Procedure Note  Madison Decker  [id number]  01-09-1991  Date:12/15/22  Time:1:05 PM   Provider Performing:Yanuel Tagg C Katrinka Blazing   Procedure: Flexible bronchoscopy and EBUS Bronchoscopy Bronch w/ brushing Bronch w/ BAL  Indication(s) Nonresolving pneumonia  Consent Risks of the procedure as well as the alternatives and risks of each were explained to the patient and/or caregiver.  Consent for the procedure was obtained.  Anesthesia General Anesthesia   Time Out Verified patient identification, verified procedure, site/side was marked, verified correct patient position, special equipment/implants available, medications/allergies/relevant history reviewed, required imaging and test results available.   Sterile Technique Usual hand hygiene, masks, gowns, and gloves were used   Procedure Description Diagnostic bronchoscope advanced through endotracheal tube and into airway.  Airways were examined down to subsegmental level with findings noted below.  Following diagnostic evaluation, brushing and BAL performed in LLL.  The diagnostic bronchoscope was then removed and the EBUS bronchoscope was advanced into airway with stations 11L and 7 biopsied and sent for slide, cell block, and/or culture.  The EBUS bronchoscope was removed after assuring no active bleeding from biopsy site.  Findings:  - No endobronchial lesions - Clear BAL - Enlarged hilar and mediastinal nodes: sent for cyto and AFB/fungal   Complications/Tolerance None; patient tolerated the procedure well. Chest X-ray is not needed post procedure.   EBL Minimal   Specimen(s) LLL BAL (cyto/culture) LLL brushing (cyto) Station 11L FNA (cyto) Station 7 FNA (cyto, culture)

## 2022-12-15 NOTE — Progress Notes (Signed)
12/15/2022   I have seen and evaluated the patient for ILD NOS   S:  No events, cough improved. Still needing 4LPM O2   O: Blood pressure 96/66, pulse 64, temperature 97.6 F (36.4 C), temperature source Oral, resp. rate 17, height 5\' 3"  (1.6 m), weight 81.6 kg, SpO2 94 %.  No distress Minimal crackles Ext warm Moves to command   A:  Multifocal infiltrates w/ working diagnosis of inflammatory lung disease Mediastinal adenopathy  P:  NPO bronch today with BAL and EBUS guided biopsy (4R +/- 7), BAL LLL r/o opportunistic infections and get idea of any sarcoid type features on EBUS Continue steroids Will touch base with Dewald after bronch to see if anything else we can add   Myrla Halsted MD [facility] Prefer epic messenger for cross cover needs If after hours, please call E-link

## 2022-12-15 NOTE — Anesthesia Preprocedure Evaluation (Signed)
Anesthesia Evaluation  Patient identified by MRN, date of birth, ID band Patient awake    Reviewed: Allergy & Precautions, NPO status , Patient's Chart, lab work & pertinent test results  Airway Mallampati: II  TM Distance: >3 FB Neck ROM: Full    Dental no notable dental hx.    Pulmonary shortness of breath   Pulmonary exam normal breath sounds clear to auscultation       Cardiovascular negative cardio ROS Normal cardiovascular exam Rhythm:Regular Rate:Normal     Neuro/Psych    Depression    negative neurological ROS  negative psych ROS   GI/Hepatic negative GI ROS, Neg liver ROS,,,  Endo/Other  negative endocrine ROS    Renal/GU negative Renal ROS  negative genitourinary   Musculoskeletal negative musculoskeletal ROS (+)    Abdominal   Peds  Hematology negative hematology ROS (+)   Anesthesia Other Findings   Reproductive/Obstetrics                             Anesthesia Physical Anesthesia Plan  ASA: II  Anesthesia Plan: General   Post-op Pain Management: Minimal or no pain anticipated   Induction: Intravenous  PONV Risk Score and Plan: 3 and Treatment may vary due to age or medical condition, Ondansetron, Dexamethasone and Midazolam  Airway Management Planned: Oral ETT  Additional Equipment:   Intra-op Plan:   Post-operative Plan: Extubation in OR  Informed Consent: I have reviewed the patients History and Physical, chart, labs and discussed the procedure including the risks, benefits and alternatives for the proposed anesthesia with the patient or authorized representative who has indicated his/her understanding and acceptance.       Plan Discussed with: Anesthesiologist  Anesthesia Plan Comments:         Anesthesia Quick Evaluation

## 2022-12-15 NOTE — Transfer of Care (Signed)
Immediate Anesthesia Transfer of Care Note  Patient: Madison Decker  Procedure(s) Performed: ENDOBRONCHIAL ULTRASOUND VIDEO BRONCHOSCOPY WITHOUT FLUORO BRONCHIAL WASHINGS BRONCHIAL BRUSHINGS BRONCHIAL NEEDLE ASPIRATION BIOPSIES  Patient Location: Endoscopy Unit  Anesthesia Type:General  Level of Consciousness: awake and alert   Airway & Oxygen Therapy: Patient Spontanous Breathing and Patient connected to face mask oxygen  Post-op Assessment: Report given to RN and Post -op Vital signs reviewed and stable  Post vital signs: Reviewed and stable  Last Vitals:  Vitals Value Taken Time  BP 106/60 12/15/22 1312  Temp    Pulse 107 12/15/22 1313  Resp 27 12/15/22 1313  SpO2 92 % 12/15/22 1313  Vitals shown include unfiled device data.  Last Pain:  Vitals:   12/15/22 1117  TempSrc: Tympanic  PainSc: 0-No pain      Patients Stated Pain Goal: 0 (12/15/22 0926)  Complications: No notable events documented.

## 2022-12-15 NOTE — H&P (View-Only) (Signed)
12/15/2022   I have seen and evaluated the patient for ILD NOS   S:  No events, cough improved. Still needing 4LPM O2   O: Blood pressure 96/66, pulse 64, temperature 97.6 F (36.4 C), temperature source Oral, resp. rate 17, height 5\' 3"  (1.6 m), weight 81.6 kg, SpO2 94 %.  No distress Minimal crackles Ext warm Moves to command   A:  Multifocal infiltrates w/ working diagnosis of inflammatory lung disease Mediastinal adenopathy  P:  NPO bronch today with BAL and EBUS guided biopsy (4R +/- 7), BAL LLL r/o opportunistic infections and get idea of any sarcoid type features on EBUS Continue steroids Will touch base with Dewald after bronch to see if anything else we can add   Myrla Halsted MD [facility] Prefer epic messenger for cross cover needs If after hours, please call E-link

## 2022-12-15 NOTE — Interval H&P Note (Signed)
No changes to hx, patient consents to procedure after hearing risks/benefits/alternatives.

## 2022-12-15 NOTE — Plan of Care (Signed)
  Problem: Activity: Goal: Ability to tolerate increased activity will improve Outcome: Progressing   Problem: Clinical Measurements: Goal: Ability to maintain a body temperature in the normal range will improve Outcome: Progressing   Problem: Respiratory: Goal: Ability to maintain adequate ventilation will improve Outcome: Progressing Goal: Ability to maintain a clear airway will improve Outcome: Progressing   Problem: Education: Goal: Knowledge of General Education information will improve Description: Including pain rating scale, medication(s)/side effects and non-pharmacologic comfort measures Outcome: Progressing   Problem: Health Behavior/Discharge Planning: Goal: Ability to manage health-related needs will improve Outcome: Progressing   Problem: Clinical Measurements: Goal: Ability to maintain clinical measurements within normal limits will improve Outcome: Progressing Goal: Will remain free from infection Outcome: Progressing Goal: Diagnostic test results will improve Outcome: Progressing Goal: Respiratory complications will improve Outcome: Progressing Goal: Cardiovascular complication will be avoided Outcome: Progressing   Problem: Activity: Goal: Risk for activity intolerance will decrease Outcome: Progressing   Problem: Nutrition: Goal: Adequate nutrition will be maintained Outcome: Progressing   Problem: Coping: Goal: Level of anxiety will decrease Outcome: Progressing   Problem: Elimination: Goal: Will not experience complications related to bowel motility Outcome: Progressing Goal: Will not experience complications related to urinary retention Outcome: Progressing   Problem: Pain Managment: Goal: General experience of comfort will improve Outcome: Progressing   Problem: Skin Integrity: Goal: Risk for impaired skin integrity will decrease Outcome: Progressing

## 2022-12-15 NOTE — Plan of Care (Signed)
  Problem: Activity: Goal: Ability to tolerate increased activity will improve Outcome: Not Progressing   Problem: Clinical Measurements: Goal: Ability to maintain a body temperature in the normal range will improve Outcome: Not Progressing   

## 2022-12-15 NOTE — Progress Notes (Signed)
PROGRESS NOTE  Madison Decker ZOX:[id number] DOB: March 30, 1991   PCP: Rica Records, FNP  Patient is from: Home  DOA: 12/10/2022 LOS: 5  Chief complaints Chief Complaint  Patient presents with   Cough     Brief Narrative / Interim history: 32 year old F with PMH of allergy/angioedema and recent admission in 10/2022 with multifocal pulmonary infiltrate and lymphadenopathy and treated with antibiotics and a steroid and discharged home to follow-up with pulmonology.  She presents with progressive dyspnea and cough for about a week.  She also had associated weight loss, fatigue, generalized weakness, extremity swelling and numbness.  CT angio chest negative for PE but worsening multifocal infiltrate and mild increased severity of bilateral healer LAD.  Admitted with working diagnosis of pneumonia/inflammatory lung disease.  Started on antibiotics and Solu-Medrol.   Patient had extensive workup with echocardiogram, autoimmune/serology labs unrevealing prior hospitalization.  PCCM consulted, and she underwent EBUS on 8/3.  Remains on Solu-Medrol.   Subjective: Seen and examined earlier this morning before she went down for EBUS.  No major events overnight of this morning.  No complaints.  Saturating in low 90s on 3 L by nasal cannula.   Objective: Vitals:   12/15/22 1335 12/15/22 1339 12/15/22 1406 12/15/22 1409  BP:  (!) 96/54 106/77   Pulse: (!) 105 (!) 108 (!) 117 (!) 111  Resp: (!) 23 (!) 21    Temp:      TempSrc:      SpO2: (!) 86% (!) 89% 90% 92%  Weight:      Height:        Examination:  GENERAL: No apparent distress.  Nontoxic. HEENT: MMM.  Vision and hearing grossly intact.  NECK: Supple.  No apparent JVD.  RESP:  No IWOB.  Crackles globally. CVS:  RRR. Heart sounds normal.  ABD/GI/GU: BS+. Abd soft, NTND.  MSK/EXT:  Moves extremities. No apparent deformity. No edema.  SKIN: Areas of skin lesion with hyperpigmentation on the back of the neck, right upper  arm... NEURO: Awake, alert and oriented appropriately.  No apparent focal neuro deficit. PSYCH: Calm. Normal affect.   Procedures:  None  Microbiology summarized: COVID-19, influenza and RSV PCR nonreactive Blood cultures NGTD MRSA PCR screen negative  Assessment and plan: Principal Problem:   Acute respiratory failure with hypoxia (HCC)  Acute hypoxic respiratory failure due to multifocal pulmonary infiltrate: Per EDP note, O2 sat was 73% on RA on arrival to ED and required 6 L.  CT angio chest negative for PE but worsening infiltrate and healer lymphadenopathy.  No leukocytosis.  Pro-Cal negative.  Patient had normal TTE, serology and autoimmune workup..  Symptoms improving with antibiotic and steroid.  Saturating in low 90s on 3 L by nasal cannula -S/p EBUS on 8/3.  Follow cultures and cytology. -Solu-Medrol decreased to 80 mg daily on 9/2. -Ceftriaxone and Zithromax from 8/29-9/2. -Wean oxygen as able.  Incentive telemetry, OOB and ambulatory saturation -Seems she has follow-up with rheumatology, Dr. Corliss Skains on 05/04/2023    Globus sensation, postprandial emesis: At risk of esophageal stenosis.  Esophagram without significant finding other than some reflux signs. 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  B12 397. -Has f/u with Dr. Terrace Arabia, neurology, 02/26/2023 -Continue gabapentin   Sinus tachycardia: TSH 1.750.  Mild tachycardia after EBUS. -Pt has been referred to cardiology, Dr. Jenene Slicker (appt 10/3), though I suspect this is secondary to other conditions.   Prediabetes: HbA1c 6.1%.  -CBGs AC/HS, sensitive  SSI for steroid induced hyperglycemia.   Hypokalemia -Monitor replenish as appropriate  Hyponatremia: Mild -Continue monitoring  Skin lesion: Scattered areas of skin lesion over the posterior lower part of her neck, left upper arm, lower back.  No signs of infection or ulceration.  Per patient and family,  spontaneously emerged over the last few months.  No pruritus.  Usually improves with steroid cream.  Autoimmune? -Consider punch biopsy if BAL not revealing.  Body mass index is 29.02 kg/m.           DVT prophylaxis:  heparin injection 5,000 Units Start: 12/11/22 0600 SCDs Start: 12/11/22 0045  Code Status: Full code Family Communication: Updated patient's mother at bedside. Level of care: Med-Surg Status is: Inpatient Remains inpatient appropriate because: Respiratory failure   Final disposition: Home Consultants:  Pulmonology  35 minutes with more than 50% spent in reviewing records, counseling patient/family and coordinating care.   Sch Meds:  Scheduled Meds:  Chlorhexidine Gluconate Cloth  6 each Topical Daily   gabapentin  100 mg Oral TID   heparin  5,000 Units Subcutaneous Q8H   methylPREDNISolone (SOLU-MEDROL) injection  80 mg Intravenous Daily   pantoprazole (PROTONIX) IV  40 mg Intravenous Q24H   Continuous Infusions:   PRN Meds:.acetaminophen **OR** acetaminophen, amisulpride, HYDROmorphone (DILAUDID) injection, meperidine (DEMEROL) injection, ondansetron **OR** ondansetron (ZOFRAN) IV, oxyCODONE, oxyCODONE **OR** oxyCODONE, promethazine  Antimicrobials: Anti-infectives (From admission, onward)    Start     Dose/Rate Route Frequency Ordered Stop   12/10/22 2000  cefTRIAXone (ROCEPHIN) 2 g in sodium chloride 0.9 % 100 mL IVPB        2 g 200 mL/hr over 30 Minutes Intravenous Every 24 hours 12/10/22 1957 12/14/22 2211   12/10/22 2000  azithromycin (ZITHROMAX) 500 mg in sodium chloride 0.9 % 250 mL IVPB        500 mg 250 mL/hr over 60 Minutes Intravenous Every 24 hours 12/10/22 1957 12/14/22 2351        I have personally reviewed the following labs and images: CBC: Recent Labs  Lab 12/10/22 2024 12/11/22 0309 12/12/22 1122 12/14/22 0351  WBC 5.5 6.1 4.6 7.3  NEUTROABS 2.9 3.6  --   --   HGB 13.5 11.5* 12.3 11.8*  HCT 42.7 36.4 39.5 37.8  MCV  89.7 88.3 91.0 90.4  PLT 318 298 272 240   BMP &GFR Recent Labs  Lab 12/10/22 2024 12/11/22 0309 12/12/22 1122 12/13/22 0814 12/14/22 0351  NA 134* 134* 137 138 135  K 2.9* 2.8* 3.2* 3.5 3.8  CL 97* 102 102 103 103  CO2 28 26 25 28 25   GLUCOSE 97 112* 126* 107* 124*  BUN 8 6 9 10 11   CREATININE 0.64 0.56 0.54 0.48 0.45  CALCIUM 8.1* 7.9* 8.2* 8.2* 8.1*  MG  --   --  2.2 2.4 2.5*  PHOS  --   --   --  4.2 4.1   Estimated Creatinine Clearance: 103.1 mL/min (by C-G formula based on SCr of 0.45 mg/dL). Liver & Pancreas: Recent Labs  Lab 12/10/22 2024 12/11/22 0309 12/12/22 1122 12/13/22 0814 12/14/22 0351  AST 50* 41 41  --   --   ALT 18 15 15   --   --   ALKPHOS 51 42 46  --   --   BILITOT 0.8 0.5 0.7  --   --   PROT 8.0 6.6 7.3  --   --   ALBUMIN 3.0* 2.5* 2.8* 2.7* 2.6*   No results for  input(s): "LIPASE", "AMYLASE" in the last 168 hours. No results for input(s): "AMMONIA" in the last 168 hours. Diabetic: No results for input(s): "HGBA1C" in the last 72 hours. No results for input(s): "GLUCAP" in the last 168 hours. Cardiac Enzymes: No results for input(s): "CKTOTAL", "CKMB", "CKMBINDEX", "TROPONINI" in the last 168 hours. No results for input(s): "PROBNP" in the last 8760 hours. Coagulation Profile: Recent Labs  Lab 12/10/22 2024  INR 1.0   Thyroid Function Tests: No results for input(s): "TSH", "T4TOTAL", "FREET4", "T3FREE", "THYROIDAB" in the last 72 hours. Lipid Profile: No results for input(s): "CHOL", "HDL", "LDLCALC", "TRIG", "CHOLHDL", "LDLDIRECT" in the last 72 hours. Anemia Panel: No results for input(s): "VITAMINB12", "FOLATE", "FERRITIN", "TIBC", "IRON", "RETICCTPCT" in the last 72 hours. Urine analysis:    Component Value Date/Time   COLORURINE YELLOW 12/11/2022 0050   APPEARANCEUR CLEAR 12/11/2022 0050   LABSPEC 1.010 12/11/2022 0050   PHURINE 7.0 12/11/2022 0050   GLUCOSEU NEGATIVE 12/11/2022 0050   HGBUR NEGATIVE 12/11/2022 0050    BILIRUBINUR NEGATIVE 12/11/2022 0050   KETONESUR 15 (A) 12/11/2022 0050   PROTEINUR 30 (A) 12/11/2022 0050   NITRITE NEGATIVE 12/11/2022 0050   LEUKOCYTESUR NEGATIVE 12/11/2022 0050   Sepsis Labs: Invalid input(s): "PROCALCITONIN", "LACTICIDVEN"  Microbiology: Recent Results (from the past 240 hour(s))  Resp panel by RT-PCR (RSV, Flu A&B, Covid) Anterior Nasal Swab     Status: None   Collection Time: 12/10/22  8:23 PM   Specimen: Anterior Nasal Swab  Result Value Ref Range Status   SARS Coronavirus 2 by RT PCR NEGATIVE NEGATIVE Final    Comment: (NOTE) SARS-CoV-2 target nucleic acids are NOT DETECTED.  The SARS-CoV-2 RNA is generally detectable in upper respiratory specimens during the acute phase of infection. The lowest concentration of SARS-CoV-2 viral copies this assay can detect is 138 copies/mL. A negative result does not preclude SARS-Cov-2 infection and should not be used as the sole basis for treatment or other patient management decisions. A negative result may occur with  improper specimen collection/handling, submission of specimen other than nasopharyngeal swab, presence of viral mutation(s) within the areas targeted by this assay, and inadequate number of viral copies(<138 copies/mL). A negative result must be combined with clinical observations, patient history, and epidemiological information. The expected result is Negative.  Fact Sheet for Patients:  BloggerCourse.com  Fact Sheet for Healthcare Providers:  SeriousBroker.it  This test is no t yet approved or cleared by the [place] FDA and  has been authorized for detection and/or diagnosis of SARS-CoV-2 by FDA under an Emergency Use Authorization (EUA). This EUA will remain  in effect (meaning this test can be used) for the duration of the COVID-19 declaration under Section 564(b)(1) of the Act, 21 U.S.C.section 360bbb-3(b)(1), unless the authorization  is terminated  or revoked sooner.       Influenza A by PCR NEGATIVE NEGATIVE Final   Influenza B by PCR NEGATIVE NEGATIVE Final    Comment: (NOTE) The Xpert Xpress SARS-CoV-2/FLU/RSV plus assay is intended as an aid in the diagnosis of influenza from Nasopharyngeal swab specimens and should not be used as a sole basis for treatment. Nasal washings and aspirates are unacceptable for Xpert Xpress SARS-CoV-2/FLU/RSV testing.  Fact Sheet for Patients: BloggerCourse.com  Fact Sheet for Healthcare Providers: SeriousBroker.it  This test is not yet approved or cleared by the [place] FDA and has been authorized for detection and/or diagnosis of SARS-CoV-2 by FDA under an Emergency Use Authorization (EUA). This EUA will remain in effect (  meaning this test can be used) for the duration of the COVID-19 declaration under Section 564(b)(1) of the Act, 21 U.S.C. section 360bbb-3(b)(1), unless the authorization is terminated or revoked.     Resp Syncytial Virus by PCR NEGATIVE NEGATIVE Final    Comment: (NOTE) Fact Sheet for Patients: BloggerCourse.com  Fact Sheet for Healthcare Providers: SeriousBroker.it  This test is not yet approved or cleared by the [place] FDA and has been authorized for detection and/or diagnosis of SARS-CoV-2 by FDA under an Emergency Use Authorization (EUA). This EUA will remain in effect (meaning this test can be used) for the duration of the COVID-19 declaration under Section 564(b)(1) of the Act, 21 U.S.C. section 360bbb-3(b)(1), unless the authorization is terminated or revoked.  Performed at [facility], [address], [place], [place] [address]   Blood Culture (routine x 2)     Status: None   Collection Time: 12/10/22  8:24 PM   Specimen: BLOOD  Result Value Ref Range Status   Specimen Description BLOOD RIGHT ANTECUBITAL  Final   Special  Requests   Final    BOTTLES DRAWN AEROBIC AND ANAEROBIC Blood Culture adequate volume   Culture   Final    NO GROWTH 5 DAYS Performed at [facility], [address], [place], [place] [address]    Report Status 12/15/2022 FINAL  Final  Blood Culture (routine x 2)     Status: None   Collection Time: 12/10/22  8:40 PM   Specimen: Left Antecubital; Blood  Result Value Ref Range Status   Specimen Description LEFT ANTECUBITAL  Final   Special Requests   Final    BOTTLES DRAWN AEROBIC AND ANAEROBIC Blood Culture adequate volume   Culture   Final    NO GROWTH 5 DAYS Performed at [facility], [address] [place], [place] [address]    Report Status 12/15/2022 FINAL  Final  MRSA Next Gen by PCR, Nasal     Status: None   Collection Time: 12/11/22  3:10 PM   Specimen: Nasal Mucosa; Nasal Swab  Result Value Ref Range Status   MRSA by PCR Next Gen NOT DETECTED NOT DETECTED Final    Comment: (NOTE) The GeneXpert MRSA Assay (FDA approved for NASAL specimens only), is one component of a comprehensive MRSA colonization surveillance program. It is not intended to diagnose MRSA infection nor to guide or monitor treatment for MRSA infections. Test performance is not FDA approved in patients less than 20 years old. Performed at [facility], [address], [place], [place] [address]     Radiology Studies: DG Chest 1 View  Result Date: 12/15/2022 CLINICAL DATA:  Pneumonia, cough. EXAM: CHEST  1 VIEW COMPARISON:  December 10, 2022. FINDINGS: Stable cardiomediastinal silhouette. Stable left basilar opacity is noted concerning for pneumonia or atelectasis with associated effusion. Stable right basilar opacity is noted concerning for atelectasis or pneumonia. Bony thorax is unremarkable. IMPRESSION: Stable bibasilar opacities as noted above. Electronically Signed   By: Lupita Raider M.D.   On: 12/15/2022 12:29      Ladarion Munyon T. Atonya Templer [facility]  If 7PM-7AM, please contact  night-coverage [email or web address] 12/15/2022, 2:38 PM

## 2022-12-15 NOTE — Anesthesia Procedure Notes (Signed)
Procedure Name: Intubation Date/Time: 12/15/2022 12:13 PM  Performed by: Florene Route, CRNAPre-anesthesia Checklist: Patient identified, Emergency Drugs available, Suction available and Patient being monitored Patient Re-evaluated:Patient Re-evaluated prior to induction Oxygen Delivery Method: Circle system utilized Preoxygenation: Pre-oxygenation with 100% oxygen Induction Type: IV induction Ventilation: Mask ventilation without difficulty Laryngoscope Size: Miller and 2 Grade View: Grade II Tube type: Oral Tube size: 8.0 mm Number of attempts: 1 Airway Equipment and Method: Stylet and Oral airway Placement Confirmation: ETT inserted through vocal cords under direct vision, positive ETCO2 and breath sounds checked- equal and bilateral Secured at: 20 cm Tube secured with: Tape Dental Injury: Teeth and Oropharynx as per pre-operative assessment

## 2022-12-16 ENCOUNTER — Telehealth: Payer: Self-pay | Admitting: Internal Medicine

## 2022-12-16 ENCOUNTER — Telehealth: Payer: Medicaid Other | Admitting: Family Medicine

## 2022-12-16 DIAGNOSIS — J9601 Acute respiratory failure with hypoxia: Secondary | ICD-10-CM | POA: Diagnosis not present

## 2022-12-16 LAB — PNEUMOCYSTIS JIROVECI SMEAR BY DFA

## 2022-12-16 MED ORDER — PREDNISONE 20 MG PO TABS
40.0000 mg | ORAL_TABLET | Freq: Every day | ORAL | Status: DC
  Administered 2022-12-17: 40 mg via ORAL
  Filled 2022-12-16: qty 2

## 2022-12-16 MED ORDER — PREDNISONE 20 MG PO TABS: 20.0000 mg | ORAL_TABLET | Freq: Every day | ORAL | Status: DC

## 2022-12-16 MED ORDER — INFLUENZA VIRUS VACC SPLIT PF (FLUZONE) 0.5 ML IM SUSY
0.5000 mL | PREFILLED_SYRINGE | INTRAMUSCULAR | Status: AC
  Administered 2022-12-17: 0.5 mL via INTRAMUSCULAR
  Filled 2022-12-16: qty 0.5

## 2022-12-16 MED ORDER — PANTOPRAZOLE SODIUM 40 MG PO TBEC
40.0000 mg | DELAYED_RELEASE_TABLET | Freq: Every day | ORAL | Status: DC
  Administered 2022-12-17: 40 mg via ORAL
  Filled 2022-12-16: qty 1

## 2022-12-16 MED ORDER — SULFAMETHOXAZOLE-TRIMETHOPRIM 800-160 MG PO TABS
1.0000 | ORAL_TABLET | ORAL | Status: DC
  Administered 2022-12-16: 1 via ORAL
  Filled 2022-12-16: qty 1

## 2022-12-16 NOTE — Anesthesia Postprocedure Evaluation (Signed)
Anesthesia Post Note  Patient: Madison Decker  Procedure(s) Performed: ENDOBRONCHIAL ULTRASOUND VIDEO BRONCHOSCOPY WITHOUT FLUORO BRONCHIAL WASHINGS BRONCHIAL BRUSHINGS BRONCHIAL NEEDLE ASPIRATION BIOPSIES     Patient location during evaluation: PACU Anesthesia Type: General Level of consciousness: awake and alert Pain management: pain level controlled Vital Signs Assessment: post-procedure vital signs reviewed and stable Respiratory status: spontaneous breathing, nonlabored ventilation and respiratory function stable Cardiovascular status: blood pressure returned to baseline and stable Postop Assessment: no apparent nausea or vomiting Anesthetic complications: no   No notable events documented.  Last Vitals:  Vitals:   12/16/22 0424 12/16/22 0830  BP: 113/69 108/74  Pulse: 76 (!) 108  Resp:  14  Temp: 36.9 C 36.6 C  SpO2: 97% (!) 85%    Last Pain:  Vitals:   12/16/22 0830  TempSrc: Oral  PainSc:                  Lowella Curb

## 2022-12-16 NOTE — Progress Notes (Signed)
12/16/2022   I have seen and evaluated the patient for ILD NOS   S:  Feeling better Cough resolved  O: Blood pressure 96/66, pulse 64, temperature 97.6 F (36.4 C), temperature source Oral, resp. rate 17, height 5\' 3"  (1.6 m), weight 81.6 kg, SpO2 94 %.  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

## 2022-12-16 NOTE — Progress Notes (Signed)
[facility] PROGRESS NOTE    Progress Note  [place] Decker  GEX:[id number] DOB: 07-07-1990 DOA: 12/10/2022 PCP: Rica Records, FNP     Brief Narrative:   [place] Decker is an 32 y.o. female past medical history of angioedema, with a last admission on July 2024 for multifocal pneumonia and lymphadenopathy treated with antibiotics and discharged on steroids and antibiotics to follow-up with pulmonary as an outpatient.  Came into the hospital with progressive dyspnea and cough associated weight loss continue angio negative for PE but worsening for multifocal infiltrates bilaterally treated for pneumonia and inflammatory lung disease darted on antibiotics and steroids.  PCCM was consulted she underwent EBUS on 11/14/2022  Assessment/Plan:   Acute respiratory failure with hypoxia question IDL: On arrival to the ED she was satting 73% had to be placed on 3 L. CT angio of the chest showed negative for PE but bilateral infiltrates and hilar lymphadenopathy.  She had no leukocytosis. Started on IV steroids and antibiotic she relates her symptoms are improving. She is currently requiring 3 L of oxygen. Pulmonary and critical care was consulted she status post BAL and EBUS on 11/14/2022, cytology and cultures are pending.  Prior rheumatologic workup is negative. Calcitonin is negative ESR is elevated. Pleated course of antibiotics in house on 12/14/2022. Follow-up with rheumatology on 2025.  Normal sensation: Continue PPI, esophagogram without significant reflux sign.  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 Threasa Beards neurology on 02/26/2023 continue gabapentin.  Sinus tachycardia: Now resolved heart rate ranging from 95-76 she has a follow-up with cardiology as an outpatient.  Prediabetes mellitus: With a hemoglobin A1c of 6.1. She is on steroids which will make her blood glucose erratic continue sliding scale insulin.  Hypokalemia: Repleted, now  improved.  Skin lesion: Autoimmune workup as an outpatient.    DVT prophylaxis: lovenox Family Communication: Mother Status is: Inpatient Remains inpatient appropriate because: Acute respiratory failure with hypoxia    Code Status:     Code Status Orders  (From admission, onward)           Start     Ordered   12/11/22 0033  Full code  Continuous       Question:  By:  Answer:  Consent: discussion documented in EHR   12/11/22 0034           Code Status History     Date Active Date Inactive Code Status Order ID Comments User Context   10/16/2022 1902 10/19/2022 0009 Full Code [id number]  Orland Mustard, MD ED   07/17/2019 1727 07/19/2019 1812 Full Code [id number]  Marylene Land, CNM Inpatient   07/16/2019 2008 07/17/2019 1555 Full Code [id number]  Joselyn Arrow, MD Inpatient         IV Access:   Peripheral IV   Procedures and diagnostic studies:   DG Chest 1 View  Result Date: 12/15/2022 CLINICAL DATA:  Pneumonia, cough. EXAM: CHEST  1 VIEW COMPARISON:  December 10, 2022. FINDINGS: Stable cardiomediastinal silhouette. Stable left basilar opacity is noted concerning for pneumonia or atelectasis with associated effusion. Stable right basilar opacity is noted concerning for atelectasis or pneumonia. Bony thorax is unremarkable. IMPRESSION: Stable bibasilar opacities as noted above. Electronically Signed   By: Lupita Raider M.D.   On: 12/15/2022 12:29     Medical Consultants:   None.   Subjective:    [place] Decker she relates her breathing is better her appetite is returning.  Objective:    Vitals:  12/15/22 1706 12/15/22 2005 12/16/22 0015 12/16/22 0424  BP: 108/69 106/73 113/72 113/69  Pulse: 95 89 73 76  Resp:  18 20   Temp: 97.9 F (36.6 C) 98.9 F (37.2 C) 98.8 F (37.1 C) 98.4 F (36.9 C)  TempSrc: Oral Oral Oral Oral  SpO2: 98% 97% 96% 97%  Weight:      Height:       SpO2: 97 % O2 Flow Rate (L/min): 3 L/min   Intake/Output  Summary (Last 24 hours) at 12/16/2022 0733 Last data filed at 12/15/2022 1313 Gross per 24 hour  Intake 400 ml  Output --  Net 400 ml   Filed Weights   12/10/22 2045 12/11/22 1505  Weight: 75.3 kg 76.7 kg    Exam: General exam: In no acute distress. Respiratory system: Good air movement and crackles bilaterally Cardiovascular system: S1 & S2 heard, RRR. No JVD. Gastrointestinal system: Abdomen is nondistended, soft and nontender.  Extremities: No pedal edema. Skin: No rashes, lesions or ulcers Psychiatry: Judgement and insight appear normal. Mood & affect appropriate.    Data Reviewed:    Labs: Basic Metabolic Panel: Recent Labs  Lab 12/10/22 2024 12/11/22 0309 12/12/22 1122 12/13/22 0814 12/14/22 0351  NA 134* 134* 137 138 135  K 2.9* 2.8* 3.2* 3.5 3.8  CL 97* 102 102 103 103  CO2 28 26 25 28 25   GLUCOSE 97 112* 126* 107* 124*  BUN 8 6 9 10 11   CREATININE 0.64 0.56 0.54 0.48 0.45  CALCIUM 8.1* 7.9* 8.2* 8.2* 8.1*  MG  --   --  2.2 2.4 2.5*  PHOS  --   --   --  4.2 4.1   GFR Estimated Creatinine Clearance: 103.1 mL/min (by C-G formula based on SCr of 0.45 mg/dL). Liver Function Tests: Recent Labs  Lab 12/10/22 2024 12/11/22 0309 12/12/22 1122 12/13/22 0814 12/14/22 0351  AST 50* 41 41  --   --   ALT 18 15 15   --   --   ALKPHOS 51 42 46  --   --   BILITOT 0.8 0.5 0.7  --   --   PROT 8.0 6.6 7.3  --   --   ALBUMIN 3.0* 2.5* 2.8* 2.7* 2.6*   No results for input(s): "LIPASE", "AMYLASE" in the last 168 hours. No results for input(s): "AMMONIA" in the last 168 hours. Coagulation profile Recent Labs  Lab 12/10/22 2024  INR 1.0   COVID-19 Labs  No results for input(s): "DDIMER", "FERRITIN", "LDH", "CRP" in the last 72 hours.  Lab Results  Component Value Date   SARSCOV2NAA NEGATIVE 12/10/2022   SARSCOV2NAA NEGATIVE 10/16/2022   SARSCOV2NAA NEGATIVE 10/10/2022   SARSCOV2NAA NEGATIVE 07/16/2019    CBC: Recent Labs  Lab 12/10/22 2024  12/11/22 0309 12/12/22 1122 12/14/22 0351  WBC 5.5 6.1 4.6 7.3  NEUTROABS 2.9 3.6  --   --   HGB 13.5 11.5* 12.3 11.8*  HCT 42.7 36.4 39.5 37.8  MCV 89.7 88.3 91.0 90.4  PLT 318 298 272 240   Cardiac Enzymes: No results for input(s): "CKTOTAL", "CKMB", "CKMBINDEX", "TROPONINI" in the last 168 hours. BNP (last 3 results) No results for input(s): "PROBNP" in the last 8760 hours. CBG: No results for input(s): "GLUCAP" in the last 168 hours. D-Dimer: No results for input(s): "DDIMER" in the last 72 hours. Hgb A1c: No results for input(s): "HGBA1C" in the last 72 hours. Lipid Profile: No results for input(s): "CHOL", "HDL", "LDLCALC", "TRIG", "CHOLHDL", "LDLDIRECT" in the  last 72 hours. Thyroid function studies: No results for input(s): "TSH", "T4TOTAL", "T3FREE", "THYROIDAB" in the last 72 hours.  Invalid input(s): "FREET3" Anemia work up: No results for input(s): "VITAMINB12", "FOLATE", "FERRITIN", "TIBC", "IRON", "RETICCTPCT" in the last 72 hours. Sepsis Labs: Recent Labs  Lab 12/10/22 2024 12/10/22 2119 12/11/22 0309 12/12/22 1122 12/14/22 0351  PROCALCITON  --  <0.10  --   --   --   WBC 5.5  --  6.1 4.6 7.3  LATICACIDVEN 1.2 1.1  --   --   --    Microbiology Recent Results (from the past 240 hour(s))  Resp panel by RT-PCR (RSV, Flu A&B, Covid) Anterior Nasal Swab     Status: None   Collection Time: 12/10/22  8:23 PM   Specimen: Anterior Nasal Swab  Result Value Ref Range Status   SARS Coronavirus 2 by RT PCR NEGATIVE NEGATIVE Final    Comment: (NOTE) SARS-CoV-2 target nucleic acids are NOT DETECTED.  The SARS-CoV-2 RNA is generally detectable in upper respiratory specimens during the acute phase of infection. The lowest concentration of SARS-CoV-2 viral copies this assay can detect is 138 copies/mL. A negative result does not preclude SARS-Cov-2 infection and should not be used as the sole basis for treatment or other patient management decisions. A negative  result may occur with  improper specimen collection/handling, submission of specimen other than nasopharyngeal swab, presence of viral mutation(s) within the areas targeted by this assay, and inadequate number of viral copies(<138 copies/mL). A negative result must be combined with clinical observations, patient history, and epidemiological information. The expected result is Negative.  Fact Sheet for Patients:  BloggerCourse.com  Fact Sheet for Healthcare Providers:  SeriousBroker.it  This test is no t yet approved or cleared by the [place] FDA and  has been authorized for detection and/or diagnosis of SARS-CoV-2 by FDA under an Emergency Use Authorization (EUA). This EUA will remain  in effect (meaning this test can be used) for the duration of the COVID-19 declaration under Section 564(b)(1) of the Act, 21 U.S.C.section 360bbb-3(b)(1), unless the authorization is terminated  or revoked sooner.       Influenza A by PCR NEGATIVE NEGATIVE Final   Influenza B by PCR NEGATIVE NEGATIVE Final    Comment: (NOTE) The Xpert Xpress SARS-CoV-2/FLU/RSV plus assay is intended as an aid in the diagnosis of influenza from Nasopharyngeal swab specimens and should not be used as a sole basis for treatment. Nasal washings and aspirates are unacceptable for Xpert Xpress SARS-CoV-2/FLU/RSV testing.  Fact Sheet for Patients: BloggerCourse.com  Fact Sheet for Healthcare Providers: SeriousBroker.it  This test is not yet approved or cleared by the [place] FDA and has been authorized for detection and/or diagnosis of SARS-CoV-2 by FDA under an Emergency Use Authorization (EUA). This EUA will remain in effect (meaning this test can be used) for the duration of the COVID-19 declaration under Section 564(b)(1) of the Act, 21 U.S.C. section 360bbb-3(b)(1), unless the authorization is  terminated or revoked.     Resp Syncytial Virus by PCR NEGATIVE NEGATIVE Final    Comment: (NOTE) Fact Sheet for Patients: BloggerCourse.com  Fact Sheet for Healthcare Providers: SeriousBroker.it  This test is not yet approved or cleared by the [place] FDA and has been authorized for detection and/or diagnosis of SARS-CoV-2 by FDA under an Emergency Use Authorization (EUA). This EUA will remain in effect (meaning this test can be used) for the duration of the COVID-19 declaration under Section 564(b)(1) of the Act, 21  U.S.C. section 360bbb-3(b)(1), unless the authorization is terminated or revoked.  Performed at [facility], [address], [place], [place] [address]   Blood Culture (routine x 2)     Status: None   Collection Time: 12/10/22  8:24 PM   Specimen: BLOOD  Result Value Ref Range Status   Specimen Description BLOOD RIGHT ANTECUBITAL  Final   Special Requests   Final    BOTTLES DRAWN AEROBIC AND ANAEROBIC Blood Culture adequate volume   Culture   Final    NO GROWTH 5 DAYS Performed at [facility], [address], [place], [place] [address]    Report Status 12/15/2022 FINAL  Final  Blood Culture (routine x 2)     Status: None   Collection Time: 12/10/22  8:40 PM   Specimen: Left Antecubital; Blood  Result Value Ref Range Status   Specimen Description LEFT ANTECUBITAL  Final   Special Requests   Final    BOTTLES DRAWN AEROBIC AND ANAEROBIC Blood Culture adequate volume   Culture   Final    NO GROWTH 5 DAYS Performed at [facility], [address], [place], [place] [address]    Report Status 12/15/2022 FINAL  Final  MRSA Next Gen by PCR, Nasal     Status: None   Collection Time: 12/11/22  3:10 PM   Specimen: Nasal Mucosa; Nasal Swab  Result Value Ref Range Status   MRSA by PCR Next Gen NOT DETECTED NOT DETECTED Final    Comment: (NOTE) The GeneXpert MRSA Assay (FDA approved for NASAL specimens only), is  one component of a comprehensive MRSA colonization surveillance program. It is not intended to diagnose MRSA infection nor to guide or monitor treatment for MRSA infections. Test performance is not FDA approved in patients less than 54 years old. Performed at [facility], [address], [place], [place] [address]   Pneumocystis smear by DFA     Status: None   Collection Time: 12/15/22 12:32 PM   Specimen: Bronchial Alveolar Lavage; Respiratory  Result Value Ref Range Status   Specimen Source-PJSRC BRONCHIAL ALVEOLAR LAVAGE  Final   Pneumocystis jiroveci Ag See Scanned report in [place] Link  Final    Comment: Performed at [facility], [address], [place], [place] [address]  Culture, BAL-quantitative w Gram Stain     Status: None (Preliminary result)   Collection Time: 12/15/22 12:32 PM   Specimen: Bronchial Alveolar Lavage; Respiratory  Result Value Ref Range Status   Specimen Description   Final    BRONCHIAL ALVEOLAR LAVAGE Performed at [facility], [address] [place], [place] [address]    Special Requests   Final    NONE Performed at [facility], [address], [place], [place] [address]    Gram Stain   Final    NO WBC SEEN NO ORGANISMS SEEN Performed at [facility], [address] [place], [place] [address]    Culture PENDING  Incomplete   Report Status PENDING  Incomplete     Medications:    Chlorhexidine Gluconate Cloth  6 each Topical Daily   gabapentin  100 mg Oral TID   heparin  5,000 Units Subcutaneous Q8H   [START ON 12/17/2022] influenza vac split trivalent PF  0.5 mL Intramuscular Tomorrow-1000   methylPREDNISolone (SOLU-MEDROL) injection  80 mg Intravenous Daily   pantoprazole (PROTONIX) IV  40 mg Intravenous Q24H   Continuous Infusions:    LOS: 6 days   Darin Engels  David Stall  [facility]  12/16/2022, 7:33 AM

## 2022-12-16 NOTE — Evaluation (Signed)
Took patient on a walk in the hall about 200 feet and monitored her Oxygen level. Her heart rate did go up while walking. Oxygen did drop down to 86 and recovered in seconds. She was currently on 3L of Nasal Cannula oxygen. She was able to walk without assistance.

## 2022-12-16 NOTE — Progress Notes (Signed)
Patient ambulated hallway on 3L O2.  Sats were 91%, decreased O2 to 2L and sats then decreased to 83% quickly.  Oxygen increased back to 3L for remainder of walk.  Upon return to room sats were 79% on 3L.  Pt tolerated fairly, sats increased back to 90% with a minute of rest.

## 2022-12-17 ENCOUNTER — Encounter (HOSPITAL_COMMUNITY): Payer: Self-pay | Admitting: Internal Medicine

## 2022-12-17 DIAGNOSIS — J9601 Acute respiratory failure with hypoxia: Secondary | ICD-10-CM | POA: Diagnosis not present

## 2022-12-17 LAB — ACID FAST SMEAR (AFB, MYCOBACTERIA): Acid Fast Smear: NEGATIVE

## 2022-12-17 LAB — CYTOLOGY - NON PAP

## 2022-12-17 MED ORDER — PREDNISONE 20 MG PO TABS: ORAL_TABLET | ORAL | 0 refills | Status: DC

## 2022-12-17 MED ORDER — PANTOPRAZOLE SODIUM 40 MG PO TBEC: 40.0000 mg | DELAYED_RELEASE_TABLET | Freq: Every day | ORAL | 0 refills | Status: DC

## 2022-12-17 MED ORDER — SULFAMETHOXAZOLE-TRIMETHOPRIM 800-160 MG PO TABS: 1.0000 | ORAL_TABLET | ORAL | 0 refills | Status: DC

## 2022-12-17 NOTE — TOC Transition Note (Signed)
Transition of Care [facility]) - CM/SW Discharge Note   Patient Details  Name: [place] Decker MRN: [id number] Date of Birth: 06-20-1990  [facility]) CM/SW Contact:  Howell Rucks, RN Phone Number: 12/17/2022, 10:11 AM   Clinical Narrative:  Met with pt and pt's mother at bedside to introduce role of TOC/NCM and review for dc planning. Pt reports she has a PCP and pharmacy in place, no current home care services or home DME, pt's mother reports pt is coming to her home after discharge to assist with care, pt's mother to provide transport. TOC consult for Home 02, [employer] rep-Jermaine to deliver 02 to bedside. No further TOC needs identified.       Final next level of care: Home/Self Care Barriers to Discharge: Barriers Resolved   Patient Goals and CMS Choice      Discharge Placement                         Discharge Plan and Services Additional resources added to the After Visit Summary for                    DME Agency: [employer] (Home 02) Date DME Agency Contacted: 12/17/22 Time DME Agency Contacted: 1011 Representative spoke with at DME Agency: Vaughan Basta            Social Determinants of Health (SDOH) Interventions SDOH Screenings   Food Insecurity: No Food Insecurity (12/11/2022)  Housing: Patient Declined (12/11/2022)  Transportation Needs: No Transportation Needs (12/11/2022)  Utilities: Patient Declined (12/11/2022)  Alcohol Screen: Low Risk  (01/27/2019)  Depression (PHQ2-9): Medium Risk (12/07/2022)  Financial Resource Strain: Low Risk  (01/27/2019)  Physical Activity: Inactive (01/27/2019)  Social Connections: Socially Isolated (01/27/2019)  Stress: No Stress Concern Present (01/27/2019)  Tobacco Use: Low Risk  (12/15/2022)     Readmission Risk Interventions    12/17/2022   10:09 AM  Readmission Risk Prevention Plan  Transportation Screening Complete  PCP or Specialist Appt within 5-7 Days Complete  Home Care Screening  Complete  Medication Review (RN CM) Complete

## 2022-12-17 NOTE — Telephone Encounter (Signed)
Patient scheduled.

## 2022-12-17 NOTE — Discharge Summary (Signed)
Physician Discharge Summary  Alexah Goike QMV:[id number] DOB: 04-05-1991 DOA: 12/10/2022  PCP: Rica Records, FNP  Admit date: 12/10/2022 Discharge date: 12/17/2022  Admitted From: Home Disposition:  Home  Recommendations for Outpatient Follow-up:  Follow up with Pulmonary in 1-2 weeks Please obtain BMP/CBC in one week. Follow-up with rheumatology in 2 weeks. Please follow up on the following pending results:  Home Health:no Equipment/Devices:home oxygen  Discharge Condition:Stable CODE STATUS:Full Diet recommendation: Heart Healthy  Brief/Interim Summary: 32 y.o. female past medical history of angioedema, with a last admission on July 2024 for multifocal pneumonia and lymphadenopathy treated with antibiotics and discharged on steroids and antibiotics to follow-up with pulmonary as an outpatient.  Came into the hospital with progressive dyspnea and cough associated weight loss continue angio negative for PE but worsening for multifocal infiltrates bilaterally treated for pneumonia and inflammatory lung disease darted on antibiotics and steroids.  PCCM was consulted she underwent EBUS on 11/14/2022   Discharge Diagnoses:  Principal Problem:   Acute respiratory failure with hypoxia (HCC)  Acute respiratory failure with hypoxia question IDL: Arrival to the ED she was satting 73% on room air had to be placed on 6 L. CT angio chest was negative for PE but it did show bilateral infiltrates and hilar lymphadenopathy with no leukocytosis. Pulmonary and critical care was consulted they performed BAL and EBUS on 11/14/2022 cytology and cultures are pending they will follow-up with workup. She was started on steroids and antibiotics her symptoms are to slowly improving. We were able to wean her down to 3 L. She will continue with at home. She will follow-up with stay pulmonary and rheumatology as an outpatient. She has an appointment with rheumatology on 12/27/2022.  Globus sensation  with postprandial emesis: Esophagogram without significant changes of reflux or stenosis. She was continuing a PPI this is now resolved.  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 Debarah Crape neurology on 1 02/26/2023 continue gabapentin.  Sinus tachycardia: Now resolved follow-up with PCP as an outpatient.  Pre- diabetes mellitus type 2 with an A1c of 6.1. Her blood glucose has remained relatively stable on steroids follow-up with PCP as an outpatient.  Hypokalemia: Repleted now resolved.  Skin lesion: Will continue autoimmune workup as an outpatient with rheumatology.    Discharge Instructions  Discharge Instructions     Diet - low sodium heart healthy   Complete by: As directed    Increase activity slowly   Complete by: As directed       Allergies as of 12/17/2022       Reactions   Sesame Seed Extract [sesame Oil] Swelling   Swelling of lips        Medication List     STOP taking these medications    cetirizine 5 MG tablet Commonly known as: ZYRTEC       TAKE these medications    EPINEPHrine 0.3 mg/0.3 mL Soaj injection Commonly known as: EPI-PEN Inject 0.3 mg into the muscle as needed for anaphylaxis.   gabapentin 100 MG capsule Commonly known as: NEURONTIN Take 1 capsule (100 mg total) by mouth 3 (three) times daily.   pantoprazole 40 MG tablet Commonly known as: PROTONIX Take 1 tablet (40 mg total) by mouth daily.   predniSONE 20 MG tablet Commonly known as: DELTASONE Take 2 tablets (40 mg total) by mouth daily with breakfast for 14 days, THEN 1 tablet (20 mg total) daily with breakfast for 1 day. Start taking on: December 17, 2022  sulfamethoxazole-trimethoprim 800-160 MG tablet Commonly known as: BACTRIM DS Take 1 tablet by mouth 3 (three) times a week for 21 days. Start taking on: December 18, 2022   triamcinolone cream 0.1 % Commonly known as: KENALOG Apply 1 Application topically 2 (two) times daily.    Vitamin D3 25 MCG (1000 UT) Caps Take 1 capsule (1,000 Units total) by mouth daily.   Womens Multivitamin Tabs Take 1 tablet by mouth daily.               Durable Medical Equipment  (From admission, onward)           Start     Ordered   12/17/22 0807  For home use only DME oxygen  Once       Question Answer Comment  Length of Need 6 Months   Mode or (Route) Nasal cannula   Liters per Minute 3   Oxygen delivery system Gas      12/17/22 0807            Allergies  Allergen Reactions   Sesame Seed Extract [Sesame Oil] Swelling    Swelling of lips    Consultations: Pulm critical care   Procedures/Studies: DG Chest 1 View  Result Date: 12/15/2022 CLINICAL DATA:  Pneumonia, cough. EXAM: CHEST  1 VIEW COMPARISON:  December 10, 2022. FINDINGS: Stable cardiomediastinal silhouette. Stable left basilar opacity is noted concerning for pneumonia or atelectasis with associated effusion. Stable right basilar opacity is noted concerning for atelectasis or pneumonia. Bony thorax is unremarkable. IMPRESSION: Stable bibasilar opacities as noted above. Electronically Signed   By: Lupita Raider M.D.   On: 12/15/2022 12:29   DG ESOPHAGUS W SINGLE CM (SOL OR THIN BA)  Result Date: 12/11/2022 CLINICAL DATA:  32 year old with dysphagia. Globus sensation. History of pneumonia. EXAM: ESOPHAGUS/BARIUM SWALLOW/TABLET STUDY TECHNIQUE: Combined double and single contrast examination was performed using effervescent crystals, high-density barium, and thin liquid barium. FLUOROSCOPY: Radiation Exposure Index (as provided by the fluoroscopic device): 28.2 mGy Kerma COMPARISON:  Chest CTA 12/10/2022 FINDINGS: Swallowing: Appears normal. No vestibular penetration or aspiration seen. Pharynx: Unremarkable. Esophagus: Normal appearance. Esophageal motility: Within normal limits. Hiatal Hernia: None. Gastroesophageal reflux: Small amount of reflux into the distal esophagus. Ingested 13mm barium  tablet: Passed normally. Other: None. IMPRESSION: 1. Normal appearance of the esophagus. No focal stricture or narrowing. 2. Small amount of gastroesophageal reflux. Reflux into the distal esophagus. Electronically Signed   By: Richarda Overlie M.D.   On: 12/11/2022 13:08   CT Angio Chest PE W/Cm &/Or Wo Cm  Result Date: 12/10/2022 CLINICAL DATA:  Cough and body aches. EXAM: CT ANGIOGRAPHY CHEST WITH CONTRAST TECHNIQUE: Multidetector CT imaging of the chest was performed using the standard protocol during bolus administration of intravenous contrast. Multiplanar CT image reconstructions and MIPs were obtained to evaluate the vascular anatomy. RADIATION DOSE REDUCTION: This exam was performed according to the departmental dose-optimization program which includes automated exposure control, adjustment of the mA and/or kV according to patient size and/or use of iterative reconstruction technique. CONTRAST:  75mL OMNIPAQUE IOHEXOL 350 MG/ML SOLN COMPARISON:  October 10, 2022 FINDINGS: Cardiovascular: The thoracic aorta is normal in appearance. Satisfactory opacification of the pulmonary arteries to the segmental level. No evidence of pulmonary embolism. There is mild, stable cardiomegaly. No pericardial effusion. Mediastinum/Nodes: There is mild AP window and bilateral hilar lymphadenopathy which is mildly increased in severity when compared to the prior study. Thyroid gland, trachea, and esophagus demonstrate no significant findings. Lungs/Pleura: Marked  severity bilateral airspace disease is seen. This is most prominent within the left upper lobe, right middle lobe and bilateral lower lobes and is mildly increased in severity when compared to the prior study. No pleural effusion or pneumothorax is identified. Upper Abdomen: There is diffuse fatty infiltration of the liver parenchyma. Musculoskeletal: No chest wall abnormality. No acute or significant osseous findings. Review of the MIP images confirms the above findings.  IMPRESSION: 1. No evidence of pulmonary embolism. 2. Worsening marked severity bilateral airspace disease, concerning for worsening multifocal infiltrates. 3. Mild AP window and bilateral hilar lymphadenopathy, mildly increased in severity when compared to the prior study. 4. Fatty liver. Electronically Signed   By: Aram Candela M.D.   On: 12/10/2022 23:21   DG Chest Port 1 View  Result Date: 12/10/2022 CLINICAL DATA:  Questionable sepsis - evaluate for abnormality EXAM: PORTABLE CHEST 1 VIEW COMPARISON:  CTA Chest 10/10/22 FINDINGS: Redemonstrated patchy multifocal airspace opacities in the left mid and lower lung fields, unchanged prior exam. Compared to prior exam there are new hazy opacities in the right lung base are worrisome for infection. No radiographically apparent displaced rib fractures. Visualized upper abdomen unremarkable. Cardiac and mediastinal contours are unchanged. Visualized upper abdomen is unremarkable. IMPRESSION: 1. New hazy opacities in the right lung base are worrisome for infection. 2. Redemonstrated patchy multifocal airspace opacities in the left mid and lower lung fields, unchanged from prior exam. Electronically Signed   By: Lorenza Cambridge M.D.   On: 12/10/2022 20:52   (Echo, Carotid, EGD, Colonoscopy, ERCP)    Subjective: No complaints  Discharge Exam: Vitals:   12/17/22 0610 12/17/22 0657  BP:    Pulse: (!) 104 97  Resp:    Temp:    SpO2: 96% 97%   Vitals:   12/16/22 2140 12/17/22 0603 12/17/22 0610 12/17/22 0657  BP:  100/60    Pulse: (!) 105 (!) 110 (!) 104 97  Resp:  20    Temp:  98.9 F (37.2 C)    TempSrc:  Oral    SpO2: 99% 96% 96% 97%  Weight:      Height:        General: Pt is alert, awake, not in acute distress Cardiovascular: RRR, S1/S2 +, no rubs, no gallops Respiratory: CTA bilaterally, no wheezing, no rhonchi Abdominal: Soft, NT, ND, bowel sounds + Extremities: no edema, no cyanosis    The results of significant diagnostics  from this hospitalization (including imaging, microbiology, ancillary and laboratory) are listed below for reference.     Microbiology: Recent Results (from the past 240 hour(s))  Resp panel by RT-PCR (RSV, Flu A&B, Covid) Anterior Nasal Swab     Status: None   Collection Time: 12/10/22  8:23 PM   Specimen: Anterior Nasal Swab  Result Value Ref Range Status   SARS Coronavirus 2 by RT PCR NEGATIVE NEGATIVE Final    Comment: (NOTE) SARS-CoV-2 target nucleic acids are NOT DETECTED.  The SARS-CoV-2 RNA is generally detectable in upper respiratory specimens during the acute phase of infection. The lowest concentration of SARS-CoV-2 viral copies this assay can detect is 138 copies/mL. A negative result does not preclude SARS-Cov-2 infection and should not be used as the sole basis for treatment or other patient management decisions. A negative result may occur with  improper specimen collection/handling, submission of specimen other than nasopharyngeal swab, presence of viral mutation(s) within the areas targeted by this assay, and inadequate number of viral copies(<138 copies/mL). A negative result must be  combined with clinical observations, patient history, and epidemiological information. The expected result is Negative.  Fact Sheet for Patients:  BloggerCourse.com  Fact Sheet for Healthcare Providers:  SeriousBroker.it  This test is no t yet approved or cleared by the [place] FDA and  has been authorized for detection and/or diagnosis of SARS-CoV-2 by FDA under an Emergency Use Authorization (EUA). This EUA will remain  in effect (meaning this test can be used) for the duration of the COVID-19 declaration under Section 564(b)(1) of the Act, 21 U.S.C.section 360bbb-3(b)(1), unless the authorization is terminated  or revoked sooner.       Influenza A by PCR NEGATIVE NEGATIVE Final   Influenza B by PCR NEGATIVE NEGATIVE  Final    Comment: (NOTE) The Xpert Xpress SARS-CoV-2/FLU/RSV plus assay is intended as an aid in the diagnosis of influenza from Nasopharyngeal swab specimens and should not be used as a sole basis for treatment. Nasal washings and aspirates are unacceptable for Xpert Xpress SARS-CoV-2/FLU/RSV testing.  Fact Sheet for Patients: BloggerCourse.com  Fact Sheet for Healthcare Providers: SeriousBroker.it  This test is not yet approved or cleared by the [place] FDA and has been authorized for detection and/or diagnosis of SARS-CoV-2 by FDA under an Emergency Use Authorization (EUA). This EUA will remain in effect (meaning this test can be used) for the duration of the COVID-19 declaration under Section 564(b)(1) of the Act, 21 U.S.C. section 360bbb-3(b)(1), unless the authorization is terminated or revoked.     Resp Syncytial Virus by PCR NEGATIVE NEGATIVE Final    Comment: (NOTE) Fact Sheet for Patients: BloggerCourse.com  Fact Sheet for Healthcare Providers: SeriousBroker.it  This test is not yet approved or cleared by the [place] FDA and has been authorized for detection and/or diagnosis of SARS-CoV-2 by FDA under an Emergency Use Authorization (EUA). This EUA will remain in effect (meaning this test can be used) for the duration of the COVID-19 declaration under Section 564(b)(1) of the Act, 21 U.S.C. section 360bbb-3(b)(1), unless the authorization is terminated or revoked.  Performed at [facility], [address] [place], [place] [address]   Blood Culture (routine x 2)     Status: None   Collection Time: 12/10/22  8:24 PM   Specimen: BLOOD  Result Value Ref Range Status   Specimen Description BLOOD RIGHT ANTECUBITAL  Final   Special Requests   Final    BOTTLES DRAWN AEROBIC AND ANAEROBIC Blood Culture adequate volume   Culture   Final    NO GROWTH 5  DAYS Performed at [facility], [address], [place], [place] [address]    Report Status 12/15/2022 FINAL  Final  Blood Culture (routine x 2)     Status: None   Collection Time: 12/10/22  8:40 PM   Specimen: Left Antecubital; Blood  Result Value Ref Range Status   Specimen Description LEFT ANTECUBITAL  Final   Special Requests   Final    BOTTLES DRAWN AEROBIC AND ANAEROBIC Blood Culture adequate volume   Culture   Final    NO GROWTH 5 DAYS Performed at [facility], [address] [place], [place] [address]    Report Status 12/15/2022 FINAL  Final  MRSA Next Gen by PCR, Nasal     Status: None   Collection Time: 12/11/22  3:10 PM   Specimen: Nasal Mucosa; Nasal Swab  Result Value Ref Range Status   MRSA by PCR Next Gen NOT DETECTED NOT DETECTED Final    Comment: (NOTE) The GeneXpert MRSA  Assay (FDA approved for NASAL specimens only), is one component of a comprehensive MRSA colonization surveillance program. It is not intended to diagnose MRSA infection nor to guide or monitor treatment for MRSA infections. Test performance is not FDA approved in patients less than 10 years old. Performed at [facility], [address] [place], [place] [address]   Pneumocystis smear by DFA     Status: None   Collection Time: 12/15/22 12:32 PM   Specimen: Bronchial Alveolar Lavage; Respiratory  Result Value Ref Range Status   Specimen Source-PJSRC BRONCHIAL ALVEOLAR LAVAGE  Final   Pneumocystis jiroveci Ag See Scanned report in [place] Link  Final    Comment: Performed at [facility], [address], [place], [place] [address]  Culture, BAL-quantitative w Gram Stain     Status: None (Preliminary result)   Collection Time: 12/15/22 12:32 PM   Specimen: Bronchial Alveolar Lavage; Respiratory  Result Value Ref Range Status   Specimen Description   Final    BRONCHIAL ALVEOLAR LAVAGE Performed at [facility], [address], [place], [place]  [address]    Special Requests   Final    NONE Performed at [facility], [address] [place], [place] [address]    Gram Stain NO WBC SEEN NO ORGANISMS SEEN   Final   Culture   Final    NO GROWTH < 24 HOURS Performed at [facility], [address], [place], [place] [address]    Report Status PENDING  Incomplete     Labs: BNP (last 3 results) Recent Labs    10/16/22 1807 12/07/22 1058  BNP 7.0 4.1   Basic Metabolic Panel: Recent Labs  Lab 12/10/22 2024 12/11/22 0309 12/12/22 1122 12/13/22 0814 12/14/22 0351  NA 134* 134* 137 138 135  K 2.9* 2.8* 3.2* 3.5 3.8  CL 97* 102 102 103 103  CO2 28 26 25 28 25   GLUCOSE 97 112* 126* 107* 124*  BUN 8 6 9 10 11   CREATININE 0.64 0.56 0.54 0.48 0.45  CALCIUM 8.1* 7.9* 8.2* 8.2* 8.1*  MG  --   --  2.2 2.4 2.5*  PHOS  --   --   --  4.2 4.1   Liver Function Tests: Recent Labs  Lab 12/10/22 2024 12/11/22 0309 12/12/22 1122 12/13/22 0814 12/14/22 0351  AST 50* 41 41  --   --   ALT 18 15 15   --   --   ALKPHOS 51 42 46  --   --   BILITOT 0.8 0.5 0.7  --   --   PROT 8.0 6.6 7.3  --   --   ALBUMIN 3.0* 2.5* 2.8* 2.7* 2.6*   No results for input(s): "LIPASE", "AMYLASE" in the last 168 hours. No results for input(s): "AMMONIA" in the last 168 hours. CBC: Recent Labs  Lab 12/10/22 2024 12/11/22 0309 12/12/22 1122 12/14/22 0351  WBC 5.5 6.1 4.6 7.3  NEUTROABS 2.9 3.6  --   --   HGB 13.5 11.5* 12.3 11.8*  HCT 42.7 36.4 39.5 37.8  MCV 89.7 88.3 91.0 90.4  PLT 318 298 272 240   Cardiac Enzymes: No results for input(s): "CKTOTAL", "CKMB", "CKMBINDEX", "TROPONINI" in the last 168 hours. BNP: Invalid input(s): "POCBNP" CBG: No results for input(s): "GLUCAP" in the last 168 hours. D-Dimer No results for input(s): "DDIMER" in the last 72 hours. Hgb A1c No results for input(s): "HGBA1C" in the last 72 hours. Lipid Profile  No results for input(s): "CHOL", "HDL", "LDLCALC", "TRIG", "CHOLHDL",  "LDLDIRECT" in the last 72 hours. Thyroid function studies No results for input(s): "TSH", "T4TOTAL", "T3FREE", "THYROIDAB" in the last 72 hours.  Invalid input(s): "FREET3" Anemia work up No results for input(s): "VITAMINB12", "FOLATE", "FERRITIN", "TIBC", "IRON", "RETICCTPCT" in the last 72 hours. Urinalysis    Component Value Date/Time   COLORURINE YELLOW 12/11/2022 0050   APPEARANCEUR CLEAR 12/11/2022 0050   LABSPEC 1.010 12/11/2022 0050   PHURINE 7.0 12/11/2022 0050   GLUCOSEU NEGATIVE 12/11/2022 0050   HGBUR NEGATIVE 12/11/2022 0050   BILIRUBINUR NEGATIVE 12/11/2022 0050   KETONESUR 15 (A) 12/11/2022 0050   PROTEINUR 30 (A) 12/11/2022 0050   NITRITE NEGATIVE 12/11/2022 0050   LEUKOCYTESUR NEGATIVE 12/11/2022 0050   Sepsis Labs Recent Labs  Lab 12/10/22 2024 12/11/22 0309 12/12/22 1122 12/14/22 0351  WBC 5.5 6.1 4.6 7.3   Microbiology Recent Results (from the past 240 hour(s))  Resp panel by RT-PCR (RSV, Flu A&B, Covid) Anterior Nasal Swab     Status: None   Collection Time: 12/10/22  8:23 PM   Specimen: Anterior Nasal Swab  Result Value Ref Range Status   SARS Coronavirus 2 by RT PCR NEGATIVE NEGATIVE Final    Comment: (NOTE) SARS-CoV-2 target nucleic acids are NOT DETECTED.  The SARS-CoV-2 RNA is generally detectable in upper respiratory specimens during the acute phase of infection. The lowest concentration of SARS-CoV-2 viral copies this assay can detect is 138 copies/mL. A negative result does not preclude SARS-Cov-2 infection and should not be used as the sole basis for treatment or other patient management decisions. A negative result may occur with  improper specimen collection/handling, submission of specimen other than nasopharyngeal swab, presence of viral mutation(s) within the areas targeted by this assay, and inadequate number of viral copies(<138 copies/mL). A negative result must be combined with clinical observations, patient history, and  epidemiological information. The expected result is Negative.  Fact Sheet for Patients:  BloggerCourse.com  Fact Sheet for Healthcare Providers:  SeriousBroker.it  This test is no t yet approved or cleared by the [place] FDA and  has been authorized for detection and/or diagnosis of SARS-CoV-2 by FDA under an Emergency Use Authorization (EUA). This EUA will remain  in effect (meaning this test can be used) for the duration of the COVID-19 declaration under Section 564(b)(1) of the Act, 21 U.S.C.section 360bbb-3(b)(1), unless the authorization is terminated  or revoked sooner.       Influenza A by PCR NEGATIVE NEGATIVE Final   Influenza B by PCR NEGATIVE NEGATIVE Final    Comment: (NOTE) The Xpert Xpress SARS-CoV-2/FLU/RSV plus assay is intended as an aid in the diagnosis of influenza from Nasopharyngeal swab specimens and should not be used as a sole basis for treatment. Nasal washings and aspirates are unacceptable for Xpert Xpress SARS-CoV-2/FLU/RSV testing.  Fact Sheet for Patients: BloggerCourse.com  Fact Sheet for Healthcare Providers: SeriousBroker.it  This test is not yet approved or cleared by the [place] FDA and has been authorized for detection and/or diagnosis of SARS-CoV-2 by FDA under an Emergency Use Authorization (EUA). This EUA will remain in effect (meaning this test can be used) for the duration of the COVID-19 declaration under Section 564(b)(1) of the Act, 21 U.S.C. section 360bbb-3(b)(1), unless the authorization is terminated or revoked.     Resp Syncytial Virus by PCR NEGATIVE NEGATIVE Final    Comment: (NOTE) Fact Sheet for Patients: BloggerCourse.com  Fact Sheet for Healthcare Providers: SeriousBroker.it  This  test is not yet approved or cleared by the [place] and has been  authorized for detection and/or diagnosis of SARS-CoV-2 by FDA under an Emergency Use Authorization (EUA). This EUA will remain in effect (meaning this test can be used) for the duration of the COVID-19 declaration under Section 564(b)(1) of the Act, 21 U.S.C. section 360bbb-3(b)(1), unless the authorization is terminated or revoked.  Performed at [facility], [address], [place], [place] [address]   Blood Culture (routine x 2)     Status: None   Collection Time: 12/10/22  8:24 PM   Specimen: BLOOD  Result Value Ref Range Status   Specimen Description BLOOD RIGHT ANTECUBITAL  Final   Special Requests   Final    BOTTLES DRAWN AEROBIC AND ANAEROBIC Blood Culture adequate volume   Culture   Final    NO GROWTH 5 DAYS Performed at [facility], [address], [place], [place] [address]    Report Status 12/15/2022 FINAL  Final  Blood Culture (routine x 2)     Status: None   Collection Time: 12/10/22  8:40 PM   Specimen: Left Antecubital; Blood  Result Value Ref Range Status   Specimen Description LEFT ANTECUBITAL  Final   Special Requests   Final    BOTTLES DRAWN AEROBIC AND ANAEROBIC Blood Culture adequate volume   Culture   Final    NO GROWTH 5 DAYS Performed at [facility], [address] [place], [place] [address]    Report Status 12/15/2022 FINAL  Final  MRSA Next Gen by PCR, Nasal     Status: None   Collection Time: 12/11/22  3:10 PM   Specimen: Nasal Mucosa; Nasal Swab  Result Value Ref Range Status   MRSA by PCR Next Gen NOT DETECTED NOT DETECTED Final    Comment: (NOTE) The GeneXpert MRSA Assay (FDA approved for NASAL specimens only), is one component of a comprehensive MRSA colonization surveillance program. It is not intended to diagnose MRSA infection nor to guide or monitor treatment for MRSA infections. Test performance is not FDA approved in patients less than 6 years old. Performed at [facility], [address] [place], [place] [address]   Pneumocystis  smear by DFA     Status: None   Collection Time: 12/15/22 12:32 PM   Specimen: Bronchial Alveolar Lavage; Respiratory  Result Value Ref Range Status   Specimen Source-PJSRC BRONCHIAL ALVEOLAR LAVAGE  Final   Pneumocystis jiroveci Ag See Scanned report in [place] Link  Final    Comment: Performed at [facility], [address] [place], [place] [address]  Culture, BAL-quantitative w Gram Stain     Status: None (Preliminary result)   Collection Time: 12/15/22 12:32 PM   Specimen: Bronchial Alveolar Lavage; Respiratory  Result Value Ref Range Status   Specimen Description   Final    BRONCHIAL ALVEOLAR LAVAGE Performed at [facility], [address], [place], [place] [address]    Special Requests   Final    NONE Performed at [facility], [address] [place], [place] [address]    Gram Stain NO WBC SEEN NO ORGANISMS SEEN   Final   Culture   Final    NO GROWTH < 24 HOURS Performed at [facility], [address], [place], [place] [address]    Report Status PENDING  Incomplete     Time coordinating discharge: Over 35 minutes  SIGNED:   Marinda Elk, MD  [facility] 12/17/2022, 8:07 AM Pager   If 7PM-7AM, please contact night-coverage [email or web address] Password [email or web address]

## 2022-12-17 NOTE — Plan of Care (Signed)

## 2022-12-17 NOTE — Final Progress Note (Signed)
Patient discharge instructions explained to patient and mom, [name]. All questions answered regarding medication regimen after discharge. Patient follow-up appointments verified with patient and mom. Patient PIV removed. Patient safely escorted via wheelchair to main entrance by CNA.

## 2022-12-17 NOTE — Progress Notes (Signed)
SATURATION QUALIFICATIONS: (This note is used to comply with regulatory documentation for home oxygen)  Patient Saturations on Room Air at Rest = 85%  Patient Saturations on Room Air while Ambulating = 85%  Patient Saturations on 4 Liters of oxygen while Ambulating = 86%  Please briefly explain why patient needs home oxygen: Patient oxygen saturations drop when patient oxygen is taken off sitting on side of the bed

## 2022-12-18 ENCOUNTER — Telehealth: Payer: Self-pay

## 2022-12-18 LAB — ACID FAST CULTURE WITH REFLEXED SENSITIVITIES (MYCOBACTERIA)

## 2022-12-18 LAB — ACID FAST SMEAR (AFB, MYCOBACTERIA)

## 2022-12-18 LAB — CULTURE, BAL-QUANTITATIVE W GRAM STAIN
Culture: NO GROWTH
Gram Stain: NONE SEEN

## 2022-12-18 LAB — FUNGUS CULTURE WITH STAIN

## 2022-12-18 NOTE — Transitions of Care (Post Inpatient/ED Visit) (Signed)
12/18/2022  Name: Madison Decker MRN: [id number] DOB: [birth year]  Today's TOC FU Call Status: Today's TOC FU Call Status:: Successful TOC FU Call Completed TOC FU Call Complete Date: 12/18/22 Patient's Name and Date of Birth confirmed.  Transition Care Management Follow-up Telephone Call Date of Discharge: 12/17/22 Discharge Facility: [facility]) Type of Discharge: Inpatient Admission Primary Inpatient Discharge Diagnosis:: Acute Respiratory Failure How have you been since you were released from the hospital?: Better Any questions or concerns?: Yes Patient Questions/Concerns:: Asking about the completion of FMLA paperwork Patient Questions/Concerns Addressed: Notified Provider of Patient Questions/Concerns  Items Reviewed: Did you receive and understand the discharge instructions provided?: Yes Medications obtained,verified, and reconciled?: Yes (Medications Reviewed) Any new allergies since your discharge?: No Dietary orders reviewed?: NA Do you have support at home?: Yes People in Home: parent(s)  Medications Reviewed Today: Medications Reviewed Today     Reviewed by Anthoney Harada, LPN (Licensed Practical Nurse) on 12/18/22 at 1329  Med List Status: <None>   Medication Order Taking? Sig Documenting Provider Last Dose Status Informant  Cholecalciferol (VITAMIN D3) 25 MCG (1000 UT) CAPS [id number] Yes Take 1 capsule (1,000 Units total) by mouth daily. Del Newman Nip, Tenna Child, FNP Taking Active   EPINEPHrine 0.3 mg/0.3 mL IJ SOAJ injection [id number] Yes Inject 0.3 mg into the muscle as needed for anaphylaxis. Novella Olive, FNP Taking Active Self, Mother  gabapentin (NEURONTIN) 100 MG capsule [id number] Yes Take 1 capsule (100 mg total) by mouth 3 (three) times daily. Del Nigel Berthold, FNP Taking Active Self, Mother           Med Note Elesa Massed, Dava Najjar Dec 10, 2022  9:17 PM) Pt has not started this medication yet.  Multiple Vitamins-Minerals (WOMENS  MULTIVITAMIN) TABS [id number] Yes Take 1 tablet by mouth daily. [provider] Taking Active Self, Mother  pantoprazole (PROTONIX) 40 MG tablet [id number] Yes Take 1 tablet (40 mg total) by mouth daily. Marinda Elk, MD Taking Active   predniSONE (DELTASONE) 20 MG tablet [id number] Yes Take 2 tablets (40 mg total) by mouth daily with breakfast for 14 days, THEN 1 tablet (20 mg total) daily with breakfast for 1 day. Marinda Elk, MD Taking Active   sulfamethoxazole-trimethoprim (BACTRIM DS) 800-160 MG tablet [id number] Yes Take 1 tablet by mouth 3 (three) times a week for 21 days. Marinda Elk, MD Taking Active   triamcinolone cream (KENALOG) 0.1 % [id number] Yes Apply 1 Application topically 2 (two) times daily. Del Newman Nip, Tenna Child, FNP Taking Active Self, Mother            Home Care and Equipment/Supplies: Were Home Health Services Ordered?: No Any new equipment or medical supplies ordered?: Yes Name of Medical supply agency?: [employer] Were you able to get the equipment/medical supplies?: Yes Do you have any questions related to the use of the equipment/supplies?: No  Functional Questionnaire: Do you need assistance with bathing/showering or dressing?: No Do you need assistance with meal preparation?: No Do you need assistance with eating?: No Do you have difficulty maintaining continence: No Do you need assistance with getting out of bed/getting out of a chair/moving?: No Do you have difficulty managing or taking your medications?: No  Follow up appointments reviewed: PCP Follow-up appointment confirmed?: Yes Date of PCP follow-up appointment?: 01/18/23 Specialist Hospital Follow-up appointment confirmed?: Yes Date of Specialist follow-up appointment?: 01/04/23 Follow-Up Specialty Provider:: Pulmonology-Dr. Everardo All Do you need transportation to your follow-up appointment?: No Do  you understand care options if your condition(s) worsen?: Yes-patient  verbalized understanding    SIGNATURE Kandis Fantasia, LPN [facility] Health Advisor [facility] You Are. We Are. [facility] Direct Dial [phone]

## 2022-12-18 NOTE — Telephone Encounter (Signed)
Patient called for [facility] and is asking if FMLA forms were received by office as well as would like to keep appointment as is for followup as she is seeing multiple specialists before.

## 2022-12-25 ENCOUNTER — Encounter: Payer: Self-pay | Admitting: Family Medicine

## 2022-12-28 ENCOUNTER — Telehealth: Payer: Self-pay | Admitting: Family Medicine

## 2022-12-28 NOTE — Telephone Encounter (Signed)
FMLA  Noted  Copied Sleeved  Original in PCP box Copy front desk folder

## 2022-12-28 NOTE — Telephone Encounter (Signed)
FMLA forms [employer]  Noted Copied  Sleeved (original in pcp box) Copy in front folder

## 2022-12-29 LAB — MISC LABCORP TEST (SEND OUT): Labcorp test code: 520085

## 2022-12-30 NOTE — Progress Notes (Signed)
Office Visit Note  Patient: Madison Decker             Date of Birth: [birth year]           MRN: [id number]            PCP: Rica Records, FNP Referring: Martina Sinner, MD Visit Date: 12/31/2022 Occupation: Cashier  Subjective:  New Patient (Initial Visit) (Patient states she is having joint pain in her hands mostly. Patient states she did have rashes that pop up mainly on her arms, thighs, neck, and back. )   History of Present Illness: Madison Decker is a 32 y.o. female here for evaluation joint pain and swelling particularly involving both hands ongoing since earlier this year.  Hand pain swelling stiffness starting around March no preceding events.  No lower extremity involvement.  Subsequently developed skin rashes on upper arms.  These were erythematous itchy lasting for a few days at a time did not clear up with antihistamines did improve with oral and topical steroids.  Left behind residual hyperpigmentation.  Subsequent developed rashes on upper back.  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  She saw Dr. Dellis Anes testing showed some mild positive skin testing but not require any additional treatment.  She had what sounds like an isolated episode of angioedema years ago food related with no recurring symptoms.  Does take antihistamines for environmental allergies.  In June she presented to urgent care after a week of rashes breaking out and started she developed some shortness of breath and was treated with course of oral doxycycline and prednisone.  This failed to resolve symptoms leading to the emergency department visit at the end of June CT scan found changes suggestive for multilobar pneumonia and was treated course of azithromycin and given topical Kenalog for the skin rashes.  Despite this she did not improve and after another week presented to the  hospital in July at a point had lost approximately 20-30 pounds over the preceding month.  She received intravenous antibiotics was discharged with azithromycin and cefdinir and a prednisone taper.  Her hand pain and swelling also improved while she was on the steroids but returned about 5 days after completing the taper.  She presented back to the hospital August 29 with again worsening dyspnea have been progressing since earlier in the month was found to have significant hypoxemia and admitted requiring 5 L/min of supplemental oxygen.  In this admission she underwent BAL and EBUS on 9/3 and she was started on antibiotics and steroids again discharged initially on 40 mg daily to decrease to 20 in 2 weeks which is today.  Symptoms have stayed pretty stable since at last discharge but remains on high flow oxygen and becomes severely tachycardic and can only tolerate extremely minimal physical exertion.  Labs revieed 10/2022 ANA neg Scl-70 neg RF neg CCP neg ANCAs neg LDH 253 ESR 25 Vit D 13.1 CRP 0.6 ACE 44 CK, aldolase wnl C3, C4 wnl HIV neg  01/2019 HBV neg  10/17/22 TTE 1. Left ventricular ejection fraction, by estimation, is 55 to 60%. The  left ventricle has normal function. The left ventricle has no regional  wall motion abnormalities. There is mild left ventricular hypertrophy.  Left ventricular diastolic parameters  were normal.   2. Right ventricular systolic function is normal. The right ventricular  size is normal.   3.  The mitral valve is normal in structure. No evidence of mitral valve  regurgitation. No evidence of mitral stenosis.   4. The aortic valve is tricuspid. Aortic valve regurgitation is not  visualized. No aortic stenosis is present.   5. The inferior vena cava is normal in size with greater than 50%  respiratory variability, suggesting right atrial pressure of 3 mmHg.   Activities of Daily Living:  Patient reports morning stiffness for 20 minutes.    Patient Denies nocturnal pain.  Difficulty dressing/grooming: Reports Difficulty climbing stairs: Reports Difficulty getting out of chair: Denies Difficulty using hands for taps, buttons, cutlery, and/or writing: Reports  Review of Systems  Constitutional:  Positive for fatigue.  HENT:  Negative for mouth sores and mouth dryness.   Eyes:  Negative for dryness.  Respiratory:  Positive for shortness of breath.   Cardiovascular:  Positive for chest pain and palpitations.  Gastrointestinal:  Negative for blood in stool, constipation and diarrhea.  Endocrine: Negative for increased urination.  Genitourinary:  Negative for involuntary urination.  Musculoskeletal:  Positive for joint pain, joint pain, joint swelling and morning stiffness. Negative for gait problem, myalgias, muscle weakness, muscle tenderness and myalgias.  Skin:  Positive for rash and sensitivity to sunlight. Negative for color change and hair loss.  Allergic/Immunologic: Negative for susceptible to infections.  Neurological:  Positive for headaches. Negative for dizziness.  Hematological:  Negative for swollen glands.  Psychiatric/Behavioral:  Positive for depressed mood. Negative for sleep disturbance. The patient is nervous/anxious.     PMFS History:  Patient Active Problem List   Diagnosis Date Noted   ILD (interstitial lung disease) (HCC) 01/07/2023   Vitamin D deficiency 12/31/2022   Acute respiratory failure with hypoxia (HCC) 12/10/2022   Rash and nonspecific skin eruption 12/07/2022   Peripheral neuropathy 12/07/2022   Multifocal pneumonia 10/16/2022   Hypokalemia 10/16/2022   Dyspnea with cardiomegaly in setting of pneumonia 10/16/2022   Angio-edema 03/13/2020   Postpartum depression 12/06/2019    Past Medical History:  Diagnosis Date   Angio-edema    Medical history non-contributory     Family History  Problem Relation Age of Onset   Asthma Mother    Cancer Maternal Grandmother        breast    Thyroid disease Maternal Grandmother    Past Surgical History:  Procedure Laterality Date   ARTHROSCOPIC REPAIR ACL Right    BRONCHIAL BRUSHINGS  12/15/2022   Procedure: BRONCHIAL BRUSHINGS;  Surgeon: Lorin Glass, MD;  Location: [facility];  Service: Endoscopy;;   BRONCHIAL NEEDLE ASPIRATION BIOPSY  12/15/2022   Procedure: BRONCHIAL NEEDLE ASPIRATION BIOPSIES;  Surgeon: Lorin Glass, MD;  Location: [facility];  Service: Endoscopy;;   BRONCHIAL WASHINGS  12/15/2022   Procedure: BRONCHIAL WASHINGS;  Surgeon: Lorin Glass, MD;  Location: [facility];  Service: Endoscopy;;   ENDOBRONCHIAL ULTRASOUND N/A 12/15/2022   Procedure: ENDOBRONCHIAL ULTRASOUND;  Surgeon: Lorin Glass, MD;  Location: [facility];  Service: Endoscopy;  Laterality: N/A;   VIDEO BRONCHOSCOPY  12/15/2022   Procedure: VIDEO BRONCHOSCOPY WITHOUT FLUORO;  Surgeon: Lorin Glass, MD;  Location: [facility];  Service: Endoscopy;;   Social History   Social History Narrative   Not on file   Immunization History  Administered Date(s) Administered   HPV Quadrivalent 07/15/2011, 05/22/2013   Influenza, Seasonal, Injecte, Preservative Fre 12/17/2022   Influenza,inj,Quad PF,6+ Mos 01/27/2019   Influenza-Unspecified 06/03/2022   PNEUMOCOCCAL CONJUGATE-20 12/14/2022   Tdap 01/13/2005, 10/17/2013, 05/08/2019  Objective: Vital Signs: BP 107/71 (BP Location: Right Arm, Patient Position: Sitting, Cuff Size: Normal)   Pulse (!) 116   Resp 16   Ht 5\' 6"  (1.676 m)   Wt 161 lb (73 kg)   LMP 11/24/2022 (Exact Date)   BMI 25.99 kg/m    Physical Exam HENT:     Mouth/Throat:     Mouth: Mucous membranes are moist.     Pharynx: Oropharynx is clear.  Eyes:     Conjunctiva/sclera: Conjunctivae normal.  Cardiovascular:     Rate and Rhythm: Regular rhythm. Tachycardia present.  Pulmonary:     Effort: Pulmonary effort is normal.     Comments: Respiratory crackles and decreased air movement in both lung bases On  supplemental oxygen by nasal cannula 4 L/min Musculoskeletal:     Right lower leg: No edema.     Left lower leg: No edema.  Lymphadenopathy:     Cervical: No cervical adenopathy.  Skin:    General: Skin is warm and dry.     Findings: Rash present.  Neurological:     Mental Status: She is alert.  Psychiatric:        Mood and Affect: Mood normal.      Musculoskeletal Exam:  Shoulders full ROM no tenderness or swelling Elbows full ROM no tenderness or swelling Wrists full ROM no tenderness or swelling Fingers full ROM, no palpable joint effusions or tenderness Knees full ROM no tenderness or swelling Ankles full ROM no tenderness or swelling   Investigation: No additional findings.  Imaging: DG Chest 1 View  Result Date: 12/15/2022 CLINICAL DATA:  Pneumonia, cough. EXAM: CHEST  1 VIEW COMPARISON:  December 10, 2022. FINDINGS: Stable cardiomediastinal silhouette. Stable left basilar opacity is noted concerning for pneumonia or atelectasis with associated effusion. Stable right basilar opacity is noted concerning for atelectasis or pneumonia. Bony thorax is unremarkable. IMPRESSION: Stable bibasilar opacities as noted above. Electronically Signed   By: Lupita Raider M.D.   On: 12/15/2022 12:29    Recent Labs: Lab Results  Component Value Date   WBC 7.3 12/14/2022   HGB 11.8 (L) 12/14/2022   PLT 240 12/14/2022   NA 135 12/14/2022   K 3.8 12/14/2022   CL 103 12/14/2022   CO2 25 12/14/2022   GLUCOSE 124 (H) 12/14/2022   BUN 11 12/14/2022   CREATININE 0.45 12/14/2022   BILITOT 0.7 12/12/2022   ALKPHOS 46 12/12/2022   AST 41 12/12/2022   ALT 15 12/12/2022   PROT 7.3 12/12/2022   ALBUMIN 2.6 (L) 12/14/2022   CALCIUM 8.1 (L) 12/14/2022   QFTBGOLDPLUS Indeterminate (A) 10/17/2022    Speciality Comments: No specialty comments available.  Procedures:  No procedures performed Allergies: Sesame seed extract [sesame oil]   Assessment / Plan:     Visit Diagnoses: Rash and  nonspecific skin eruption - Plan: Myositis Specific II Antibodies Panel, Sedimentation rate, C-reactive protein, Complement, total, Anti-DNA antibody, double-stranded  Hyperpigmented skin rashes in multiple areas associated with new onset of hand swelling and lung inflammation will check myositis antibody panel.  Also checking sed rate CRP and complements and double-stranded DNA for other evidence of systemic inflammation.  Symptoms are doing better today on the high-dose prednisone so hard to say if there are any new areas of rash involvement.  Multifocal pneumonia  Dyspnea, unspecified type  Concerning for inflammatory lung disease due to lack of response on multiple courses of antibiotics but does respond with prednisone treatment.  Had recent bronchoscopy seems  like 1 note suspected granulomas on initial slide final result though did not appear to have any malignancy or granulomas.  Angioedema, subsequent encounter  Vitamin D deficiency  Now supplementing on 1000 units daily.  Can recheck to see what new baseline this arrives at or if the supplement dose needs changing.  Probably not contributing to current symptoms but may be important anticipating a prolonged steroid exposure.  Orders: Orders Placed This Encounter  Procedures   Myositis Specific II Antibodies Panel   Sedimentation rate   C-reactive protein   Complement, total   Anti-DNA antibody, double-stranded   Meds ordered this encounter  Medications   DISCONTD: predniSONE (DELTASONE) 20 MG tablet    Sig: Take 1 tablet (20 mg total) by mouth daily with breakfast.    Dispense:  30 tablet    Refill:  0     Follow-Up Instructions: No follow-ups on file.   Fuller Plan, MD  Note - This record has been created using [employer].  Chart creation errors have been sought, but may not always  have been located. Such creation errors do not reflect on  the standard of medical care.

## 2022-12-31 ENCOUNTER — Ambulatory Visit: Payer: Medicaid Other | Attending: Rheumatology | Admitting: Internal Medicine

## 2022-12-31 ENCOUNTER — Encounter: Payer: Self-pay | Admitting: Internal Medicine

## 2022-12-31 VITALS — BP 107/71 | HR 116 | Resp 16 | Ht 66.0 in | Wt 161.0 lb

## 2022-12-31 DIAGNOSIS — R21 Rash and other nonspecific skin eruption: Secondary | ICD-10-CM | POA: Diagnosis not present

## 2022-12-31 DIAGNOSIS — T783XXD Angioneurotic edema, subsequent encounter: Secondary | ICD-10-CM

## 2022-12-31 DIAGNOSIS — R0600 Dyspnea, unspecified: Secondary | ICD-10-CM | POA: Diagnosis not present

## 2022-12-31 DIAGNOSIS — J189 Pneumonia, unspecified organism: Secondary | ICD-10-CM

## 2022-12-31 DIAGNOSIS — E559 Vitamin D deficiency, unspecified: Secondary | ICD-10-CM

## 2022-12-31 DIAGNOSIS — R899 Unspecified abnormal finding in specimens from other organs, systems and tissues: Secondary | ICD-10-CM | POA: Diagnosis not present

## 2022-12-31 MED ORDER — PREDNISONE 20 MG PO TABS
20.0000 mg | ORAL_TABLET | Freq: Every day | ORAL | 0 refills | Status: DC
Start: 2022-12-31 — End: 2023-01-07

## 2023-01-04 ENCOUNTER — Other Ambulatory Visit: Payer: Self-pay | Admitting: Family Medicine

## 2023-01-04 ENCOUNTER — Encounter: Payer: Self-pay | Admitting: Family Medicine

## 2023-01-04 ENCOUNTER — Inpatient Hospital Stay (HOSPITAL_BASED_OUTPATIENT_CLINIC_OR_DEPARTMENT_OTHER): Payer: Medicaid Other | Admitting: Pulmonary Disease

## 2023-01-05 NOTE — Telephone Encounter (Signed)
Hello, When you have the chance, could you please type a letter confirming that the patient is unable to work and that their return date is uncertain. This is due patient diagnosis of  respiratory failure with hypoxia, which requires continuous oxygen support (4 L via nasal cannula).  Additionally, the patient will need support for food stamps, benefits, and childcare assistance.  Thanks!

## 2023-01-06 ENCOUNTER — Ambulatory Visit (HOSPITAL_COMMUNITY)
Admission: RE | Admit: 2023-01-06 | Discharge: 2023-01-06 | Disposition: A | Discharge status: Home / Self Care | Payer: Medicaid Other | Source: Ambulatory Visit | Attending: Pulmonary Disease | Admitting: Pulmonary Disease

## 2023-01-06 DIAGNOSIS — M25542 Pain in joints of left hand: Secondary | ICD-10-CM | POA: Diagnosis present

## 2023-01-06 DIAGNOSIS — M25541 Pain in joints of right hand: Secondary | ICD-10-CM | POA: Insufficient documentation

## 2023-01-06 DIAGNOSIS — J189 Pneumonia, unspecified organism: Secondary | ICD-10-CM | POA: Diagnosis present

## 2023-01-06 LAB — MYOSITIS SPECIFIC II ANTIBODIES PANEL
EJ AB: <11 SI (ref <11)
JO-1 AB: <11 SI (ref <11)
MDA-5 AB: 99 SI — ABNORMAL HIGH (ref <11)
MI-2 ALPHA AB: <11 SI (ref <11)
MI-2 BETA AB: <11 SI (ref <11)
NXP-2 AB: <11 SI (ref <11)
OJ AB: <11 SI (ref <11)
PL-12 AB: <11 SI (ref <11)
PL-7 AB: <11 SI (ref <11)
SRP-AB: <11 SI (ref <11)
TIF-1y AB: <11 SI (ref <11)

## 2023-01-06 LAB — ANTI-DNA ANTIBODY, DOUBLE-STRANDED: ds DNA Ab: 3 IU/mL

## 2023-01-06 LAB — SEDIMENTATION RATE: Sed Rate: 53 mm/h — ABNORMAL HIGH (ref 0–20)

## 2023-01-06 LAB — COMPLEMENT, TOTAL: Compl, Total (CH50): 54 U/mL (ref 31–60)

## 2023-01-06 LAB — C-REACTIVE PROTEIN: CRP: <3 mg/L (ref <8.0)

## 2023-01-06 NOTE — Telephone Encounter (Signed)
Forms faxed to [facility] Patients father picked up hard copy for patient

## 2023-01-07 ENCOUNTER — Ambulatory Visit (HOSPITAL_BASED_OUTPATIENT_CLINIC_OR_DEPARTMENT_OTHER): Payer: Medicaid Other | Admitting: Pulmonary Disease

## 2023-01-07 ENCOUNTER — Encounter (HOSPITAL_BASED_OUTPATIENT_CLINIC_OR_DEPARTMENT_OTHER): Payer: Self-pay | Admitting: Pulmonary Disease

## 2023-01-07 VITALS — BP 108/68 | Ht 66.0 in | Wt 161.0 lb

## 2023-01-07 DIAGNOSIS — J849 Interstitial pulmonary disease, unspecified: Secondary | ICD-10-CM | POA: Insufficient documentation

## 2023-01-07 DIAGNOSIS — J189 Pneumonia, unspecified organism: Secondary | ICD-10-CM

## 2023-01-07 MED ORDER — SULFAMETHOXAZOLE-TRIMETHOPRIM 800-160 MG PO TABS: 1.0000 | ORAL_TABLET | ORAL | 1 refills | Status: DC

## 2023-01-07 MED ORDER — PREDNISONE 20 MG PO TABS
20.0000 mg | ORAL_TABLET | Freq: Every day | ORAL | 2 refills | Status: DC
Start: 2023-01-07 — End: 2023-01-07

## 2023-01-07 MED ORDER — PANTOPRAZOLE SODIUM 40 MG PO TBEC: 40.0000 mg | DELAYED_RELEASE_TABLET | Freq: Every day | ORAL | 5 refills | Status: AC

## 2023-01-07 MED ORDER — PREDNISONE 20 MG PO TABS
20.0000 mg | ORAL_TABLET | Freq: Every day | ORAL | 0 refills | Status: DC
Start: 2023-01-07 — End: 2023-01-07

## 2023-01-07 MED ORDER — BUDESONIDE-FORMOTEROL FUMARATE 160-4.5 MCG/ACT IN AERO: 2.0000 | INHALATION_SPRAY | Freq: Two times a day (BID) | RESPIRATORY_TRACT | 5 refills | Status: DC

## 2023-01-07 MED ORDER — PREDNISONE 10 MG PO TABS: ORAL_TABLET | ORAL | 0 refills | Status: AC

## 2023-01-07 NOTE — Progress Notes (Signed)
Synopsis: Referred in July 2024 for shortness of breath - hospital follow up  Subjective:   PATIENT ID: Madison Decker GENDER: female DOB: 08-16-1990, MRN: [id number]  HPI  Chief Complaint  Patient presents with   Multifocal pneumonia   Madison Decker is a 32 year old woman, never smoker who was admitted for shortness of breath at [facility] and returns to pulmonary clinic for follow up.    She reports having intermittent swelling and joint pains of her hands over recent months and later developing shortness of breath more so with exertion.  She noticed the dyspnea while walking at work, she works in Engineering geologist at [employer].  Chest x-ray showed multifocal pneumonia and she was treated with azithromycin.  She was previously seen in an urgent care where she was given steroids and doxycycline with initial improvement but return of symptoms after completion of this treatment.    CT chest 6/29 shows numerous enlarged mediastinal and hilar lymph nodes and prominent bilateral axillary lymph nodes.  There is very extensive heterogeneous and consolidative airspace opacity throughout the lungs, greatest in the left lung and bilateral bases.  No Pleural effusions.   She returned to the ER 7/5 with shortness of breath, cough and body aches despite taking azithromycin.  She was started on IV ceftriaxone and reports feeling better today.   Other than her hands bilaterally she denies any other joint swelling or pain.  She denies any muscle aches.  She reports 20 to 30 pound weight loss over recent weeks.  She has decreased appetite.   No history of breathing issues.  Her mother and maternal grandmother have asthma.  She denies any smoking or vaping and no drug use.  No family history of autoimmune conditions.  01/07/23 She is followed by Madison Decker. She was recently hospitalized from 12/10/22-12/17/22 for pneumonia that failed outpatient therapy. While hospitalized she underwent EBUS  with TBNA  of 11L and 7 for enlarged hilar and mediastinum on 12/15/22 which was negative malignancy and granulomas. Discharged on prednisone 40 mg x 2 weeks and currently 20 mg one week ago. On bactrim ppx. Since discharge she has oxygen and been compliant. Has shortness of breath and fatigue with exertion. Family present and reports that when stepping down on prednisone strength she has not been able to do as much. When monitoring O2 it has been low at times.  Past Medical History:  Diagnosis Date   Angio-edema    Medical history non-contributory      Family History  Problem Relation Age of Onset   Asthma Mother    Cancer Maternal Grandmother        breast   Thyroid disease Maternal Grandmother      Social History   Socioeconomic History   Marital status: Single    Spouse name: Not on file   Number of children: Not on file   Years of education: Not on file   Highest education level: Some college, no degree  Occupational History   Not on file  Tobacco Use   Smoking status: Never   Smokeless tobacco: Never  Vaping Use   Vaping status: Former   Devices: tried using vape- less than 1 year of use stopped 04-13-21  Substance and Sexual Activity   Alcohol use: Not Currently    Comment: Socially   Drug use: No   Sexual activity: Yes    Birth control/protection: None  Other Topics Concern   Not on file  Social History  Narrative   Not on file   Social Determinants of Health   Financial Resource Strain: Low Risk  (01/27/2019)   Overall Financial Resource Strain (CARDIA)    Difficulty of Paying Living Expenses: Not hard at all  Food Insecurity: No Food Insecurity (12/11/2022)   Hunger Vital Sign    Worried About Running Out of Food in the Last Year: Never true    Ran Out of Food in the Last Year: Never true  Transportation Needs: No Transportation Needs (12/11/2022)   PRAPARE - Administrator, Civil Service (Medical): No    Lack of Transportation (Non-Medical): No  Physical  Activity: Inactive (01/27/2019)   Exercise Vital Sign    Days of Exercise per Week: 0 days    Minutes of Exercise per Session: 0 min  Stress: No Stress Concern Present (01/27/2019)   [employer] of Occupational Health - Occupational Stress Questionnaire    Feeling of Stress : Not at all  Social Connections: Socially Isolated (01/27/2019)   Social Connection and Isolation Panel [NHANES]    Frequency of Communication with Friends and Family: Three times a week    Frequency of Social Gatherings with Friends and Family: More than three times a week    Attends Religious Services: Never    Database administrator or Organizations: No    Attends Banker Meetings: Never    Marital Status: Never married  Intimate Partner Violence: Not At Risk (12/11/2022)   Humiliation, Afraid, Rape, and Kick questionnaire    Fear of Current or Ex-Partner: No    Emotionally Abused: No    Physically Abused: No    Sexually Abused: No     Allergies  Allergen Reactions   Sesame Seed Extract [Sesame Oil] Swelling    Swelling of lips     Outpatient Medications Prior to Visit  Medication Sig Dispense Refill   Cholecalciferol (VITAMIN D3) 25 MCG (1000 UT) CAPS Take 1 capsule (1,000 Units total) by mouth daily. 90 capsule 1   EPINEPHrine 0.3 mg/0.3 mL IJ SOAJ injection Inject 0.3 mg into the muscle as needed for anaphylaxis. 1 each 1   gabapentin (NEURONTIN) 100 MG capsule Take 1 capsule (100 mg total) by mouth 3 (three) times daily. 90 capsule 3   Multiple Vitamins-Minerals (WOMENS MULTIVITAMIN) TABS Take 1 tablet by mouth daily.     pantoprazole (PROTONIX) 40 MG tablet Take 1 tablet (40 mg total) by mouth daily. 30 tablet 0   predniSONE (DELTASONE) 20 MG tablet Take 1 tablet (20 mg total) by mouth daily with breakfast. 30 tablet 0   sulfamethoxazole-trimethoprim (BACTRIM DS) 800-160 MG tablet Take 1 tablet by mouth 3 (three) times a week for 21 days. 9 tablet 0   triamcinolone cream (KENALOG)  0.1 % Apply 1 Application topically 2 (two) times daily. 80 g 3   No facility-administered medications prior to visit.    Review of Systems  Constitutional:  Positive for malaise/fatigue. Negative for chills, diaphoresis, fever and weight loss.  HENT:  Negative for congestion.   Respiratory:  Positive for shortness of breath. Negative for cough, hemoptysis, sputum production and wheezing.   Cardiovascular:  Negative for chest pain, palpitations and leg swelling.      Objective:   Vitals:   01/07/23 1441 01/07/23 1448  BP:  108/68  SpO2:  (!) 87%  Weight: 161 lb (73 kg)   Height: 5\' 6"  (1.676 m)      Physical Exam Vitals reviewed.  Constitutional:  General: She is not in acute distress.    Appearance: She is well-developed. She is not diaphoretic.  HENT:     Head: Normocephalic and atraumatic.     Nose: Nose normal.     Mouth/Throat:     Pharynx: No oropharyngeal exudate.  Eyes:     General: No scleral icterus.    Extraocular Movements: EOM normal.     Conjunctiva/sclera: Conjunctivae normal.     Pupils: Pupils are equal, round, and reactive to light.  Neck:     Vascular: No JVD.     Trachea: No tracheal deviation.  Cardiovascular:     Rate and Rhythm: Normal rate and regular rhythm.     Heart sounds: Normal heart sounds. No murmur heard.    No friction rub. No gallop.  Pulmonary:     Effort: Pulmonary effort is normal. No respiratory distress.     Breath sounds: Normal breath sounds. No stridor. No wheezing or rales.  Abdominal:     General: Bowel sounds are normal. There is no distension.     Palpations: Abdomen is soft.     Tenderness: There is no abdominal tenderness.  Musculoskeletal:        General: No deformity or edema. Normal range of motion.     Cervical back: Normal range of motion and neck supple.  Lymphadenopathy:     Cervical: No cervical adenopathy.  Skin:    General: Skin is warm and dry.     Coloration: Skin is not pale.     Findings:  No erythema or rash.  Neurological:     Mental Status: She is alert and oriented to person, place, and time.     Cranial Nerves: No cranial nerve deficit.     Sensory: No sensory deficit.  Psychiatric:        Mood and Affect: Mood and affect normal.        Behavior: Behavior normal.        Thought Content: Thought content normal.        Judgment: Judgment normal.       CBC    Component Value Date/Time   WBC 7.3 12/14/2022 0351   RBC 4.18 12/14/2022 0351   HGB 11.8 (L) 12/14/2022 0351   HGB 14.0 12/07/2022 1058   HCT 37.8 12/14/2022 0351   HCT 44.3 12/07/2022 1058   PLT 240 12/14/2022 0351   PLT 314 12/07/2022 1058   MCV 90.4 12/14/2022 0351   MCV 89 12/07/2022 1058   MCH 28.2 12/14/2022 0351   MCHC 31.2 12/14/2022 0351   RDW 14.6 12/14/2022 0351   RDW 14.4 12/07/2022 1058   LYMPHSABS 1.2 12/11/2022 0309   LYMPHSABS 0.7 12/07/2022 1058   MONOABS 1.2 (H) 12/11/2022 0309   EOSABS 0.0 12/11/2022 0309   EOSABS 0.0 12/07/2022 1058   BASOSABS 0.0 12/11/2022 0309   BASOSABS 0.0 12/07/2022 1058      Latest Ref Rng & Units 12/14/2022    3:51 AM 12/13/2022    8:14 AM 12/12/2022   11:22 AM  BMP  Glucose 70 - 99 mg/dL 308  657  846   BUN 6 - 20 mg/dL 11  10  9    Creatinine 0.44 - 1.00 mg/dL 9.62  9.52  8.41   Sodium 135 - 145 mmol/L 135  138  137   Potassium 3.5 - 5.1 mmol/L 3.8  3.5  3.2   Chloride 98 - 111 mmol/L 103  103  102   CO2 22 - 32  mmol/L 25  28  25    Calcium 8.9 - 10.3 mg/dL 8.1  8.2  8.2    Chest imaging: CXR 10/16/22 1. No significant change in patchy airspace opacities within the left mid to lower lung and right lower lung. This may be minimally improved, but that may be secondary to improved overall air volumes on the current study. This is again suggestive of multifocal infection. 2. Mild cardiomegaly.  CTA Chest 10/10/22 1. Negative examination for pulmonary embolism. 2. Very extensive heterogeneous and consolidative airspace opacity throughout the  lungs, particularly of the left lung and bilateral lung bases. There may be some underlying component of scarring or fibrosis involving the peripheral left upper lobe. Findings are most consistent with multifocal infection. 3. Numerous enlarged mediastinal and hilar lymph nodes, likely reactive. 4. Cardiomegaly.  CXR 10/10/22 Multifocal patchy heterogeneous pulmonary opacities, concerning for infectious or inflammatory etiology.  CT Chest 01/06/23 On my read, improved infiltrates bilaterally with still peripheral invovlement with dense infiltates  PFT:     No data to display          Labs: Negative ANA, CCP, RF, ANCA, C3/C4, ScL-70, CRP ESR 25 ACE 44 LDH 253 Lysozyme 4.1  Path:  Echo:  Heart Catheterization:     Assessment & Plan:   No diagnosis found.  Discussion: Shiquita Householder is a 32 year old woman, never smoker who was admitted for hospital follow-up for recurrent pneumonia that is likely inflammatory. Reviewed results including biopsy results with no clear diagnosis. CT 01/06/23 improved infiltrate on prednisone however worsening oxygenation after stepping down to 20 mg.  Will plan to restart with slower and more prolonged taper.  Plan: Increased O2 to 8L with activity and sleep. Goal SpO2 >88%  Continue prednisone 40 mg x 4 weeks, then 30 mg x 4 weeks, then 20 mg x 4 weeks with plan to repeat CT chest at that time.   START Symbicort 160-4.5 mcg TWO puffs in the morning and evening. Rinse mouth out after use  Follow-up with Dr. Lisette Abu, MD [facility] Office: [phone]   Current Outpatient Medications:    Cholecalciferol (VITAMIN D3) 25 MCG (1000 UT) CAPS, Take 1 capsule (1,000 Units total) by mouth daily., Disp: 90 capsule, Rfl: 1   EPINEPHrine 0.3 mg/0.3 mL IJ SOAJ injection, Inject 0.3 mg into the muscle as needed for anaphylaxis., Disp: 1 each, Rfl: 1   gabapentin (NEURONTIN) 100 MG capsule, Take 1 capsule  (100 mg total) by mouth 3 (three) times daily., Disp: 90 capsule, Rfl: 3   Multiple Vitamins-Minerals (WOMENS MULTIVITAMIN) TABS, Take 1 tablet by mouth daily., Disp: , Rfl:    pantoprazole (PROTONIX) 40 MG tablet, Take 1 tablet (40 mg total) by mouth daily., Disp: 30 tablet, Rfl: 0   predniSONE (DELTASONE) 20 MG tablet, Take 1 tablet (20 mg total) by mouth daily with breakfast., Disp: 30 tablet, Rfl: 0   sulfamethoxazole-trimethoprim (BACTRIM DS) 800-160 MG tablet, Take 1 tablet by mouth 3 (three) times a week for 21 days., Disp: 9 tablet, Rfl: 0   triamcinolone cream (KENALOG) 0.1 %, Apply 1 Application topically 2 (two) times daily., Disp: 80 g, Rfl: 3

## 2023-01-07 NOTE — Patient Instructions (Signed)
Continue prednisone 40 mg x 4 weeks, then 30 mg x 4 weeks, then 20 mg x 4 weeks with plan to repeat CT chest at that time.   START Symbicort 160-4.5 mcg TWO puffs in the morning and evening. Rinse mouth out after use

## 2023-01-08 ENCOUNTER — Ambulatory Visit: Payer: Medicaid Other | Admitting: Allergy & Immunology

## 2023-01-08 ENCOUNTER — Telehealth (HOSPITAL_BASED_OUTPATIENT_CLINIC_OR_DEPARTMENT_OTHER): Payer: Self-pay | Admitting: Pulmonary Disease

## 2023-01-08 NOTE — Telephone Encounter (Signed)
Spoke with [employer] to explain urgency of situation. She was very helpful and states someone will go to patient's house today to exchange concentrator and refill tanks based on new order.   Spoke with patient's mother (DPR) and advised. Nothing further needed at this time.

## 2023-01-11 ENCOUNTER — Encounter (HOSPITAL_BASED_OUTPATIENT_CLINIC_OR_DEPARTMENT_OTHER): Payer: Self-pay | Admitting: Pulmonary Disease

## 2023-01-11 ENCOUNTER — Encounter: Payer: Self-pay | Admitting: Family Medicine

## 2023-01-12 ENCOUNTER — Other Ambulatory Visit: Payer: Self-pay | Admitting: *Deleted

## 2023-01-12 DIAGNOSIS — J849 Interstitial pulmonary disease, unspecified: Secondary | ICD-10-CM

## 2023-01-12 DIAGNOSIS — M3313 Other dermatomyositis without myopathy: Secondary | ICD-10-CM

## 2023-01-13 ENCOUNTER — Telehealth: Payer: Self-pay | Admitting: Pulmonary Disease

## 2023-01-13 DIAGNOSIS — J849 Interstitial pulmonary disease, unspecified: Secondary | ICD-10-CM

## 2023-01-13 NOTE — Telephone Encounter (Signed)
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  I will be calling patient and letting her know of this plan.   Thanks, Dr. Francine Graven

## 2023-01-14 ENCOUNTER — Ambulatory Visit: Payer: Medicaid Other | Attending: Internal Medicine | Admitting: Internal Medicine

## 2023-01-14 ENCOUNTER — Encounter: Payer: Self-pay | Admitting: Internal Medicine

## 2023-01-14 VITALS — BP 88/58 | HR 129 | Ht 65.0 in | Wt 161.8 lb

## 2023-01-14 DIAGNOSIS — R000 Tachycardia, unspecified: Secondary | ICD-10-CM

## 2023-01-14 DIAGNOSIS — I959 Hypotension, unspecified: Secondary | ICD-10-CM | POA: Diagnosis not present

## 2023-01-14 NOTE — Patient Instructions (Signed)
Medication Instructions:  Your physician recommends that you continue on your current medications as directed. Please refer to the Current Medication list given to you today.   Labwork: None  Testing/Procedures: None  Follow-Up: Your physician recommends that you schedule a follow-up appointment in: 6 months  Any Other Special Instructions Will Be Listed Below (If Applicable).  Call the office if blood pressure stays consistently less than 90 mmhg systolic.    Thank you for choosing [facility]!      If you need a refill on your cardiac medications before your next appointment, please call your pharmacy.

## 2023-01-14 NOTE — Progress Notes (Signed)
Cardiology Office Note  Date: 01/14/2023   ID: Madison Decker, DOB [birth year], MRN [id number]  PCP:  Rica Records, FNP  Cardiologist:  None Electrophysiologist:  None   History of Present Illness: Madison Decker is a 32 y.o. female was recently discharged from the hospital after multiple admissions for recurrent pneumonia not responding to antibiotics and currently on steroids.  On home O2, 8L per minute.  Patient was referred to cardiology clinic prior to the hospital admission mainly for evaluation sinus tachycardia.  Accompanied by mother and father.  Patient has a 39-year-old kid at home.  Asymptomatic, SpO2 83% on 8L per minute today in the clinic.  Instructed patient and patient's family to increase the O2 to maintain goal SpO2 more than 88%.  Her BP was also noted to be around 90 mm increase BP at home.  Her heart rates are better controlled at home.  Today in the clinic, it is elevated.  Mother stated that her heart rates usually elevate whenever she feels anxious.  Also she noticed that when she becomes hypoxic, her heart rates also get elevated.  Does not do much at home due to chronic hypoxia, following rheumatology and pulmonology.  Myositis panel sent out.  Past Medical History:  Diagnosis Date   Angio-edema    Medical history non-contributory     Past Surgical History:  Procedure Laterality Date   ARTHROSCOPIC REPAIR ACL Right    BRONCHIAL BRUSHINGS  12/15/2022   Procedure: BRONCHIAL BRUSHINGS;  Surgeon: Lorin Glass, MD;  Location: [facility];  Service: Endoscopy;;   BRONCHIAL NEEDLE ASPIRATION BIOPSY  12/15/2022   Procedure: BRONCHIAL NEEDLE ASPIRATION BIOPSIES;  Surgeon: Lorin Glass, MD;  Location: [facility];  Service: Endoscopy;;   BRONCHIAL WASHINGS  12/15/2022   Procedure: BRONCHIAL WASHINGS;  Surgeon: Lorin Glass, MD;  Location: [facility];  Service: Endoscopy;;   ENDOBRONCHIAL ULTRASOUND N/A 12/15/2022   Procedure:  ENDOBRONCHIAL ULTRASOUND;  Surgeon: Lorin Glass, MD;  Location: [facility];  Service: Endoscopy;  Laterality: N/A;   VIDEO BRONCHOSCOPY  12/15/2022   Procedure: VIDEO BRONCHOSCOPY WITHOUT FLUORO;  Surgeon: Lorin Glass, MD;  Location: [facility];  Service: Endoscopy;;    Current Outpatient Medications  Medication Sig Dispense Refill   budesonide-formoterol (SYMBICORT) 160-4.5 MCG/ACT inhaler Inhale 2 puffs into the lungs in the morning and at bedtime. 1 each 5   Cholecalciferol (VITAMIN D3) 25 MCG (1000 UT) CAPS Take 1 capsule (1,000 Units total) by mouth daily. 90 capsule 1   EPINEPHrine 0.3 mg/0.3 mL IJ SOAJ injection Inject 0.3 mg into the muscle as needed for anaphylaxis. 1 each 1   gabapentin (NEURONTIN) 100 MG capsule Take 1 capsule (100 mg total) by mouth 3 (three) times daily. 90 capsule 3   Multiple Vitamins-Minerals (WOMENS MULTIVITAMIN) TABS Take 1 tablet by mouth daily.     pantoprazole (PROTONIX) 40 MG tablet Take 1 tablet (40 mg total) by mouth daily. 30 tablet 5   predniSONE (DELTASONE) 10 MG tablet Take 4 tablets (40 mg total) by mouth daily with breakfast for 28 days, THEN 3 tablets (30 mg total) daily with breakfast for 28 days, THEN 2 tablets (20 mg total) daily with breakfast for 28 days. 252 tablet 0   sulfamethoxazole-trimethoprim (BACTRIM DS) 800-160 MG tablet Take 1 tablet by mouth 3 (three) times a week. 36 tablet 1   triamcinolone cream (KENALOG) 0.1 % Apply 1 Application topically 2 (two) times daily. 80 g 3  No current facility-administered medications for this visit.   Allergies:  Sesame seed extract [sesame oil]   Social History: The patient  reports that she has never smoked. She has never used smokeless tobacco. She reports that she does not currently use alcohol. She reports that she does not use drugs.   Family History: The patient's family history includes Asthma in her mother; Cancer in her maternal grandmother; Thyroid disease in her maternal  grandmother.   ROS:  Please see the history of present illness. Otherwise, complete review of systems is positive for none  All other systems are reviewed and negative.   Physical Exam: VS:  BP (!) 88/58   Pulse (!) 129   Ht 5\' 5"  (1.651 m)   Wt 161 lb 12.8 oz (73.4 kg)   LMP 11/24/2022 (Exact Date)   SpO2 (!) 83% Comment: on 8 L/min 24/7  BMI 26.92 kg/m , BMI Body mass index is 26.92 kg/m.  Wt Readings from Last 3 Encounters:  01/14/23 161 lb 12.8 oz (73.4 kg)  01/07/23 161 lb (73 kg)  12/31/22 161 lb (73 kg)    General: Patient appears comfortable at rest. HEENT: Conjunctiva and lids normal, oropharynx clear with moist mucosa. Neck: Supple, no elevated JVP or carotid bruits, no thyromegaly. Lungs: Clear to auscultation, nonlabored breathing at rest. Cardiac: Regular rate and rhythm, no S3 or significant systolic murmur, no pericardial rub. Abdomen: Soft, nontender, no hepatomegaly, bowel sounds present, no guarding or rebound. Extremities: No pitting edema, distal pulses 2+. Skin: Warm and dry. Musculoskeletal: No kyphosis. Neuropsychiatric: Alert and oriented x3, affect grossly appropriate.  Recent Labwork: 12/07/2022: BNP 4.1; TSH 1.750 12/12/2022: ALT 15; AST 41 12/14/2022: BUN 11; Creatinine, Ser 0.45; Hemoglobin 11.8; Magnesium 2.5; Platelets 240; Potassium 3.8; Sodium 135     Component Value Date/Time   CHOL 134 12/07/2022 1058   TRIG 112 12/07/2022 1058   HDL 46 12/07/2022 1058   CHOLHDL 2.9 12/07/2022 1058   LDLCALC 68 12/07/2022 1058     Assessment and Plan:  Acute on chronic hypoxic respiratory failure Inflammatory lung disease, myositis panel sent out Sinus tachycardia, secondary to anxiety/hypoxia Hypotension, rule out autoimmune etiology   -Oxygen saturation today is 83% on 8L O2.  Goal SpO2 > 88%. Talked to the patient and her family, verbalized understanding to increase O2 and update her pulmonologist. Mother reported that her heart rates usually go  up whenever she is hypoxic or when she is anxious. Treat the underlying etiology of sinus tachycardia, hypoxia and anxiety.  It sounds likely possible that patient feels nervous when she comes to doctors offices as her heart rates are better controlled at home.  BP is around 90 mmHg SBP at home. Today in the clinic it's in 80s, asymptomatic.  Encouraged p.o. hydration and liberal salt intake. If still her blood pressures continue to be less than 90 mmHg SBP, she will benefit from addition of midodrine and workup for ?adrenal insufficiency, autoimmune.  Instructed patient's family to reach out to the clinic if her blood pressures remain less than 90 mmHg at home despite p.o. hydration and salt intake.      Medication Adjustments/Labs and Tests Ordered: Current medicines are reviewed at length with the patient today.  Concerns regarding medicines are outlined above.    Disposition:  Follow up  6 months  Signed Carisa Backhaus Verne Spurr, MD, 01/14/2023 2:35 PM    [facility] at [facility] [address] [place], [place], [place] [address]

## 2023-01-14 NOTE — Telephone Encounter (Signed)
Urgent referral was placed.

## 2023-01-18 ENCOUNTER — Ambulatory Visit: Payer: Self-pay | Admitting: Family Medicine

## 2023-01-19 LAB — FUNGUS CULTURE WITH STAIN

## 2023-01-19 LAB — FUNGUS CULTURE RESULT

## 2023-01-19 LAB — FUNGAL ORGANISM REFLEX

## 2023-02-01 ENCOUNTER — Telehealth: Payer: Self-pay | Admitting: Pulmonary Disease

## 2023-02-01 NOTE — Telephone Encounter (Signed)
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 Madison Decker phone number is [phone].

## 2023-02-10 ENCOUNTER — Ambulatory Visit: Payer: Medicaid Other | Admitting: Allergy & Immunology

## 2023-02-17 LAB — ACID FAST CULTURE WITH REFLEXED SENSITIVITIES (MYCOBACTERIA): Acid Fast Culture: NEGATIVE

## 2023-02-26 ENCOUNTER — Ambulatory Visit: Payer: Medicaid Other | Admitting: Neurology

## 2023-03-01 NOTE — Telephone Encounter (Signed)
Noted, thanks.  JD

## 2023-03-10 ENCOUNTER — Other Ambulatory Visit (HOSPITAL_COMMUNITY): Payer: Medicaid Other

## 2023-03-10 ENCOUNTER — Ambulatory Visit (HOSPITAL_COMMUNITY): Admission: RE | Admit: 2023-03-10 | Payer: Medicaid Other | Source: Ambulatory Visit

## 2023-03-22 ENCOUNTER — Ambulatory Visit: Payer: Medicaid Other | Admitting: Pulmonary Disease

## 2023-05-04 ENCOUNTER — Encounter: Payer: Medicaid Other | Admitting: Rheumatology

## 2023-06-03 ENCOUNTER — Ambulatory Visit: Payer: Medicaid Other | Admitting: Rheumatology

## 2023-07-31 ENCOUNTER — Other Ambulatory Visit: Payer: Self-pay

## 2023-07-31 ENCOUNTER — Emergency Department (HOSPITAL_COMMUNITY)

## 2023-07-31 ENCOUNTER — Encounter (HOSPITAL_COMMUNITY): Payer: Self-pay | Admitting: Emergency Medicine

## 2023-07-31 ENCOUNTER — Emergency Department (HOSPITAL_COMMUNITY)
Admission: EM | Admit: 2023-07-31 | Discharge: 2023-08-01 | Disposition: A | Discharge status: Home / Self Care | Attending: Emergency Medicine | Admitting: Emergency Medicine

## 2023-07-31 DIAGNOSIS — R000 Tachycardia, unspecified: Secondary | ICD-10-CM | POA: Insufficient documentation

## 2023-07-31 DIAGNOSIS — R4182 Altered mental status, unspecified: Secondary | ICD-10-CM | POA: Diagnosis present

## 2023-07-31 DIAGNOSIS — T50905A Adverse effect of unspecified drugs, medicaments and biological substances, initial encounter: Secondary | ICD-10-CM

## 2023-07-31 LAB — COMPREHENSIVE METABOLIC PANEL WITH GFR
ALT: 13 U/L (ref 0–44)
AST: 24 U/L (ref 15–41)
Albumin: 3.7 g/dL (ref 3.5–5.0)
Alkaline Phosphatase: 39 U/L (ref 38–126)
Anion gap: 12 (ref 5–15)
BUN: 18 mg/dL (ref 6–20)
CO2: 23 mmol/L (ref 22–32)
Calcium: 9.2 mg/dL (ref 8.9–10.3)
Chloride: 99 mmol/L (ref 98–111)
Creatinine, Ser: 1 mg/dL (ref 0.44–1.00)
GFR, Estimated: >60 mL/min (ref >60)
Glucose, Bld: 92 mg/dL (ref 70–99)
Potassium: 4.3 mmol/L (ref 3.5–5.1)
Sodium: 134 mmol/L — ABNORMAL LOW (ref 135–145)
Total Bilirubin: 0.5 mg/dL (ref 0.0–1.2)
Total Protein: 7.3 g/dL (ref 6.5–8.1)

## 2023-07-31 LAB — CBC
HCT: 34.5 % — ABNORMAL LOW (ref 36.0–46.0)
Hemoglobin: 10.8 g/dL — ABNORMAL LOW (ref 12.0–15.0)
MCH: 29.8 pg (ref 26.0–34.0)
MCHC: 31.3 g/dL (ref 30.0–36.0)
MCV: 95 fL (ref 80.0–100.0)
Platelets: 270 10*3/uL (ref 150–400)
RBC: 3.63 MIL/uL — ABNORMAL LOW (ref 3.87–5.11)
RDW: 13.8 % (ref 11.5–15.5)
WBC: 7.3 10*3/uL (ref 4.0–10.5)
nRBC: 0 % (ref 0.0–0.2)

## 2023-07-31 LAB — BLOOD GAS, VENOUS
Acid-Base Excess: 4.2 mmol/L — ABNORMAL HIGH (ref 0.0–2.0)
Bicarbonate: 27.3 mmol/L (ref 20.0–28.0)
Drawn by: 7049
O2 Saturation: 59.7 %
Patient temperature: 36.7
pCO2, Ven: 35 mmHg — ABNORMAL LOW (ref 44–60)
pH, Ven: 7.5 — ABNORMAL HIGH (ref 7.25–7.43)
pO2, Ven: 32 mmHg (ref 32–45)

## 2023-07-31 NOTE — ED Provider Notes (Signed)
 This patient is a 34 year old female who underwent a bilateral lung transplant about 6 months ago at [facility].  She spent 5 months in the hospital and has only been home for a month or 2.  Today she was drinking tequila and taking marijuana edibles with a family member when she was noted to have 2 episodes of decreased responsiveness.  She is back to her normal self now.  The family endorses that there was no other changes, the patient denies chest pain or shortness of breath, she is not febrile coughing or having any swelling.  She has no dysuria or diarrhea, she has been compliant with her medications.  My exam is unremarkable, vital signs reflect mild tachycardia currently with a heart rate of about 85-90 although initially she was at 115.  She has not been vomiting, she appears well, she has no altered mental status, she follows commands, she is neurologically intact.  Labs and x-ray pending prior to discharge, counseled the patient extensively on drug and alcohol use especially in her postoperative period especially with her sensitive nature and her fragile medical state.  Understanding expressed by both the patient and the family members   Early Glisson, MD 08/01/23 1210

## 2023-07-31 NOTE — ED Provider Notes (Signed)
 [facility] DE[facility]MENT AT [facility] Provider Note   CSN: [id number] Arrival date & time: 07/31/23  2152     History  Chief Complaint  Patient presents with   Altered Mental Status    Madison Decker is a 33 y.o. female.  33 year old female presenting with altered mental status.  Patient is companied by her father who is able to provide collateral information.  Patient was with other family members today, notes that she was drinking tequila but is unable to quantify how much she had, she also took a "gummy edible".  Patient's family members became concerned that she was slow to respond and "not acting like herself".  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  She is compliant with her transplant medications.  She denies shortness of breath, chest pain, abdominal pain, dysuria, loss of consciousness, fall.   Altered Mental Status Associated symptoms: no abdominal pain        Home Medications Prior to Admission medications   Medication Sig Start Date End Date Taking? Authorizing Provider  budesonide -formoterol  (SYMBICORT ) 160-4.5 MCG/ACT inhaler Inhale 2 puffs into the lungs in the morning and at bedtime. 01/07/23   Quillian Brunt, MD  Cholecalciferol (VITAMIN D3) 25 MCG (1000 UT) CAPS Take 1 capsule (1,000 Units total) by mouth daily. 12/12/22   Del Abron Abt, FNP  EPINEPHrine  0.3 mg/0.3 mL IJ SOAJ injection Inject 0.3 mg into the muscle as needed for anaphylaxis. 03/13/20   Mickiel Albany, FNP  gabapentin  (NEURONTIN ) 100 MG capsule Take 1 capsule (100 mg total) by mouth 3 (three) times daily. 12/07/22   Del Abron Abt, FNP  Multiple Vitamins-Minerals (WOMENS MULTIVITAMIN) TABS Take 1 tablet by mouth daily.    [provider]  pantoprazole  (PROTONIX ) 40 MG tablet Take 1 tablet (40 mg total) by mouth daily. 01/07/23   Quillian Brunt, MD   sulfamethoxazole -trimethoprim  (BACTRIM  DS) 800-160 MG tablet Take 1 tablet by mouth 3 (three) times a week. 01/08/23   Quillian Brunt, MD  triamcinolone  cream (KENALOG ) 0.1 % Apply 1 Application topically 2 (two) times daily. 12/07/22   Del Abron Abt, FNP      Allergies    Sesame seed extract [sesame oil]    Review of Systems   Review of Systems  Respiratory:  Negative for shortness of breath.   Cardiovascular:  Negative for chest pain.  Gastrointestinal:  Negative for abdominal pain.  Genitourinary:  Negative for dysuria.    Physical Exam Updated Vital Signs BP 134/73 (BP Location: Right Arm)   Pulse (!) 116   Temp 98.1 F (36.7 C) (Oral)   Resp 17   Ht 5\' 5"  (1.651 m)   Wt 73.4 kg   SpO2 100%   BMI 26.93 kg/m  Physical Exam Vitals and nursing note reviewed.  HENT:     Head: Normocephalic and atraumatic.  Eyes:     Extraocular Movements: Extraocular movements intact.     Pupils: Pupils are equal, round, and reactive to light.  Cardiovascular:     Rate and Rhythm: Tachycardia present.  Pulmonary:     Effort: Pulmonary effort is normal. No respiratory distress.     Breath sounds: Normal breath sounds.  Abdominal:     Palpations: Abdomen is soft.     Tenderness: There is no abdominal tenderness.  Musculoskeletal:     Cervical back: Normal range of motion and neck supple. No rigidity.  Comments: 5 out of 5 strength in bilateral upper and lower extremities. Grip strength intact.  Skin:    General: Skin is warm and dry.  Neurological:     Mental Status: She is alert and oriented to person, place, and time.     Comments: Intact finger-to-nose and rapid alternating movements. Patient is able to follow commands without difficulty.     ED Results / Procedures / Treatments   Labs (all labs ordered are listed, but only abnormal results are displayed) Labs Reviewed  CBC  COMPREHENSIVE METABOLIC PANEL WITH GFR  URINALYSIS, W/ REFLEX TO CULTURE  (INFECTION SUSPECTED)  BLOOD GAS, VENOUS  POC URINE PREG, ED    EKG None  Radiology No results found.  Procedures Procedures    Medications Ordered in ED Medications - No data to display  ED Course/ Medical Decision Making/ A&P                                 Medical Decision Making 33 year old female presenting with altered mental status.  Differential diagnosis includes alcohol intoxication, intoxication secondary to other illicit substances, UTI, pneumonia, postsurgical transplant complication.   Co morbidities that complicate the patient evaluation  Patient is s/p double lung transplant October 2024 at [facility], secondary to dermatomyositis with lung involvement.   Additional history obtained:  Additional history obtained from patient's family members External records from outside source obtained and reviewed including prior notes from [facility]   Lab Tests:  Results pending.   Imaging Studies ordered:  I ordered imaging studies including chest x-ray Results pending.   Cardiac Monitoring: / EKG:  Results pending.   Problem List / ED Course / Critical interventions / Medication management:  Physical exam is unremarkable, patient is alert and able to answer questions clearly without difficulty.  Patient's altered mental status likely secondary to ingestion of alcohol and illicit substances, however with her recent double lung transplant labs and imaging were obtained to rule out underlying infectious process attributing to transient alteration in mental status.  Patient will be appropriate for discharge assuming work-up is benign, patient is agreeable with this plan.   Discussed case with Dr. Lula Sale who will assume patient's care.     Amount and/or Complexity of Data Reviewed Labs: ordered. Radiology: ordered.           Final Clinical Impression(s) / ED Diagnoses Final diagnoses:  None    Rx / DC Orders ED Discharge Orders     None          Adolm Ahumada 07/31/23 2327    Early Glisson, MD 08/01/23 [phone]

## 2023-07-31 NOTE — ED Triage Notes (Signed)
 Pt brought in by family after she had 2 episodes of altered mental status tonight. Pt was with family and began to have periods of not responding normally. Pt states she just feels sleepy.  Pt had bilateral lung transplant about 6 months ago at [facility].

## 2023-08-01 LAB — URINALYSIS, W/ REFLEX TO CULTURE (INFECTION SUSPECTED)
Bilirubin Urine: NEGATIVE
Glucose, UA: NEGATIVE mg/dL
Hgb urine dipstick: NEGATIVE
Ketones, ur: NEGATIVE mg/dL
Leukocytes,Ua: NEGATIVE
Nitrite: NEGATIVE
Protein, ur: NEGATIVE mg/dL
Specific Gravity, Urine: 1.005 (ref 1.005–1.030)
pH: 7 (ref 5.0–8.0)

## 2023-08-01 LAB — HCG, SERUM, QUALITATIVE: Preg, Serum: NEGATIVE

## 2023-08-01 NOTE — Discharge Instructions (Signed)
 Avoid use of edible cannabis.

## 2023-08-02 ENCOUNTER — Ambulatory Visit: Attending: Internal Medicine | Admitting: Internal Medicine

## 2023-08-02 NOTE — Progress Notes (Signed)
 Erroneous encounter - please disregard.

## 2023-08-26 ENCOUNTER — Encounter: Payer: Self-pay | Admitting: Family Medicine

## 2023-08-26 ENCOUNTER — Ambulatory Visit: Admitting: Family Medicine

## 2023-08-26 ENCOUNTER — Other Ambulatory Visit (HOSPITAL_COMMUNITY)
Admission: RE | Admit: 2023-08-26 | Discharge: 2023-08-26 | Disposition: A | Discharge status: Home / Self Care | Source: Ambulatory Visit | Attending: Family Medicine | Admitting: Family Medicine

## 2023-08-26 VITALS — BP 116/76 | HR 68 | Ht 65.0 in | Wt 192.0 lb

## 2023-08-26 DIAGNOSIS — Z01419 Encounter for gynecological examination (general) (routine) without abnormal findings: Secondary | ICD-10-CM

## 2023-08-26 DIAGNOSIS — Z7689 Persons encountering health services in other specified circumstances: Secondary | ICD-10-CM

## 2023-08-26 DIAGNOSIS — Z124 Encounter for screening for malignant neoplasm of cervix: Secondary | ICD-10-CM | POA: Insufficient documentation

## 2023-08-26 NOTE — Progress Notes (Signed)
   Established Patient Office Visit   Subjective  Patient ID: Madison Decker, female    DOB: 07/17/1990  Age: 33 y.o. MRN: [id number]  Chief Complaint  Patient presents with   Annual Exam    PAP  Would like to discuss Birth control options (IUD )    She  has a past medical history of Angio-edema and Medical history non-contributory.  HPI Patient presents to the clinic for cervical cancer screening. For the details of today's visit, please refer to assessment and plan.   Review of Systems  Constitutional:  Negative for chills and fever.  Respiratory:  Negative for shortness of breath.   Cardiovascular:  Negative for chest pain.  Neurological:  Negative for dizziness and headaches.      Objective:     BP 116/76   Pulse 68   Ht 5\' 5"  (1.651 m)   Wt 192 lb (87.1 kg)   LMP 08/10/2023 (Exact Date)   SpO2 96%   BMI 31.95 kg/m  BP Readings from Last 3 Encounters:  08/26/23 116/76  08/01/23 122/82  01/14/23 (!) 88/58      Physical Exam Vitals reviewed.  Constitutional:      General: She is not in acute distress.    Appearance: Normal appearance. She is not ill-appearing, toxic-appearing or diaphoretic.  HENT:     Head: Normocephalic.  Eyes:     General:        Right eye: No discharge.        Left eye: No discharge.     Conjunctiva/sclera: Conjunctivae normal.  Cardiovascular:     Rate and Rhythm: Normal rate.     Pulses: Normal pulses.  Pulmonary:     Effort: Pulmonary effort is normal. No respiratory distress.     Breath sounds: Normal breath sounds.  Genitourinary:    General: Normal vulva.     Exam position: Lithotomy position.     Vagina: Normal.     Cervix: Normal.  Skin:    General: Skin is warm and dry.     Capillary Refill: Capillary refill takes less than 2 seconds.  Neurological:     Mental Status: She is alert.     Coordination: Coordination normal.     Gait: Gait normal.  Psychiatric:        Mood and Affect: Mood normal.         Behavior: Behavior normal.      No results found for any visits on 08/26/23.  The ASCVD Risk score (Arnett DK, et al., 2019) failed to calculate for the following reasons:   The 2019 ASCVD risk score is only valid for ages 10 to 34    Assessment & Plan:  Screening for cervical cancer Assessment & Plan: Pap smear done, Patient tolerated procedure well. Informed patient I will keep them updated on results. Right breast normal without mass, skin or nipple changes or axillary nodes, left breast normal without mass, skin or nipple changes or axillary nodes.  Orders: -     Cytology - PAP  Referral of patient -     Ambulatory referral to Obstetrics / Gynecology    Return in 6 months (on 02/26/2024), or if symptoms worsen or fail to improve, for Follow up.   Avelino Lek Amber Bail, FNP

## 2023-08-26 NOTE — Assessment & Plan Note (Signed)
 Pap smear done, Patient tolerated procedure well. Informed patient I will keep them updated on results. Right breast normal without mass, skin or nipple changes or axillary nodes, left breast normal without mass, skin or nipple changes or axillary nodes.

## 2023-08-26 NOTE — Patient Instructions (Signed)

## 2023-09-02 ENCOUNTER — Ambulatory Visit: Payer: Self-pay | Admitting: Family Medicine

## 2023-09-02 LAB — CYTOLOGY - PAP
Comment: NEGATIVE
Diagnosis: UNDETERMINED — AB
High risk HPV: NEGATIVE

## 2023-10-18 ENCOUNTER — Telehealth: Payer: Self-pay | Admitting: *Deleted

## 2023-10-18 NOTE — Transitions of Care (Post Inpatient/ED Visit) (Signed)
   10/18/2023  Name: Madison Decker MRN: [id number] DOB: Sep 21, 1990  Today's TOC FU Call Status: Today's TOC FU Call Status:: Successful TOC FU Call Completed TOC FU Call Complete Date: 10/18/23 Patient's Name and Date of Birth confirmed.  Transition Care Management Follow-up Telephone Call Date of Discharge: 10/14/23 Discharge Facility: Other [facility]) Name of Other ([facility]) Discharge Facility: [facility] Type of Discharge: Inpatient Admission Primary Inpatient Discharge Diagnosis:: Rt middle lobe pneumonia How have you been since you were released from the hospital?: Better Any questions or concerns?: No  Items Reviewed: Did you receive and understand the discharge instructions provided?: Yes Medications obtained,verified, and reconciled?: No (Patient tired and didn't want to go thru medications. Per patient she has gotten all her medications and is receiving the IV [facility]) Medications Not Reviewed Reasons:: Other: Any new allergies since your discharge?: No Dietary orders reviewed?: No Do you have support at home?: Yes People in Home [RPT]: parent(s) Name of Support/Comfort Primary Source: Madison Decker  Medications Reviewed Today: Medications Reviewed Today   Medications were not reviewed in this encounter     Home Care and Equipment/Supplies: Were Home Health Services Ordered?: Yes Name of Home Health Agency:: [facility] Has Agency set up a time to come to your home?: Yes First Home Health Visit Date: 10/14/23 Any new equipment or medical supplies ordered?: NA  Functional Questionnaire: Do you need assistance with bathing/showering or dressing?: Yes Do you need assistance with meal preparation?: Yes Do you need assistance with eating?: No Do you have difficulty maintaining continence: No Do you need assistance with getting out of bed/getting out of a chair/moving?: No Do you have difficulty managing or taking your medications?: No  Follow up appointments  reviewed: PCP Follow-up appointment confirmed?: Yes Date of PCP follow-up appointment?: 10/28/23 Follow-up Provider: Havery Terry Gals [facility] Follow-up appointment confirmed?: Yes Date of Specialist follow-up appointment?: 10/19/23 Follow-Up Specialty Provider:: /11/2023 11:30 AM [name], MD [facility]   [id number] [name] obgyn   10/25/2023 9:30 AM [name], MD ID-Txp [id number] [facility]   11/02/2023 10:00 AM CLINIC 2A TREATMENT SCHEDULE SPECINFCNT [facility]   11/09/2023 8:30 AM [facility] XR 1 [facility]   11/09/2023 9:00 AM LABS-[id number] [id number] [facility]   11/09/2023 9:30 AM 20 MIN, PULM LAB DS PUL FUNC [facility]   11/09/2023 1:30 PM [name], MD [facility] Do you need transportation to your follow-up appointment?: No Do you understand care options if your condition(s) worsen?: Yes-patient verbalized understanding    Cathlean Headland BSN RN [facility] Health Care Management Coordinator [email or web address] Direct Dial: [phone]  Fax: [phone] Website: [email or web address]

## 2023-10-20 ENCOUNTER — Ambulatory Visit: Admitting: Adult Health

## 2023-10-20 ENCOUNTER — Encounter: Payer: Self-pay | Admitting: Adult Health

## 2023-10-20 VITALS — BP 96/69 | HR 91 | Ht 65.0 in | Wt 182.5 lb

## 2023-10-20 DIAGNOSIS — Z942 Lung transplant status: Secondary | ICD-10-CM | POA: Diagnosis not present

## 2023-10-20 DIAGNOSIS — Z3009 Encounter for other general counseling and advice on contraception: Secondary | ICD-10-CM | POA: Diagnosis not present

## 2023-10-20 NOTE — Progress Notes (Signed)
 Subjective:     Patient ID: Madison Decker, female   DOB: 08/23/1990, 33 y.o.   MRN: [id number]  HPI Madison Decker is a 33 year old black female single, G1P1001, in to discuss getting an IUD. She had a double lung transplant in October 2024, at [facility], had bad pneumonia and scarring.  Last sex 1 month ago.     Component Value Date/Time   DIAGPAP (A) 08/26/2023 1635    - Atypical squamous cells of undetermined significance (ASC-US )   DIAGPAP  01/27/2019 1100    - Negative for intraepithelial lesion or malignancy (NILM)   HPVHIGH Negative 08/26/2023 1635   HPVHIGH Negative 01/27/2019 1100   ADEQPAP  08/26/2023 1635    Satisfactory for evaluation; transformation zone component PRESENT.   ADEQPAP  01/27/2019 1100    Satisfactory for evaluation; transformation zone component PRESENT.    PCP is I Polanco NP  Review of Systems Periods regular Denies MI,stroke, DVT ,breast cancer or migraine with aura Reviewed past medical,surgical, social and family history. Reviewed medications and allergies.     Objective:   Physical Exam BP 96/69 (BP Location: Left Arm, Patient Position: Sitting, Cuff Size: Normal)   Pulse 91   Ht 5' 5 (1.651 m)   Wt 182 lb 8 oz (82.8 kg)   LMP 09/28/2023 (Exact Date)   BMI 30.37 kg/m  UPT is negative Skin warm and dry. Neck: mid line trachea, normal thyroid , good ROM, no lymphadenopathy noted. Lungs: clear to ausculation bilaterally. Cardiovascular: regular rate and rhythm.    AA is 0 Fall risk is low    10/20/2023   11:24 AM 10/18/2023    4:47 PM 08/26/2023    4:14 PM  Depression screen PHQ 2/9  Decreased Interest 0 0 0  Down, Depressed, Hopeless 0 0 0  PHQ - 2 Score 0 0 0  Altered sleeping 1  1  Tired, decreased energy 1  1  Change in appetite 0  0  Feeling bad or failure about yourself  0  0  Trouble concentrating 0  0  Moving slowly or fidgety/restless 0  0  Suicidal thoughts 0  0  PHQ-9 Score 2  2       10/20/2023   11:24 AM 08/26/2023    4:15  PM 12/07/2022    9:27 AM  GAD 7 : Generalized Anxiety Score  Nervous, Anxious, on Edge 1 0   Control/stop worrying 0 0 1  Worry too much - different things 0  0  Trouble relaxing 0  1  Restless 0  1  Easily annoyed or irritable 0  0  Afraid - awful might happen 0  1  Total GAD 7 Score 1    Anxiety Difficulty  Not difficult at all Very difficult    Upstream - 10/20/23 1123       Pregnancy Intention Screening   Does the patient want to become pregnant in the next year? No    Does the patient's partner want to become pregnant in the next year? No    Would the patient like to discuss contraceptive options today? Yes      Contraception Wrap Up   Current Method Female Condom    End Method Abstinence    Contraception Counseling Provided Yes            Assessment:     1. Encounter for general counseling and advice on contraceptive management (Primary) Discussed IUDs, she wants for period management and birth control Mirena is  good option, will discuss with Dr Jayne in am and call pt if any changes No sex Return in 1 week for IUD insertion    2. History of Lung transplant   Plan:     Return in 1 week for IUD insertion

## 2023-10-21 ENCOUNTER — Telehealth: Payer: Self-pay | Admitting: Adult Health

## 2023-10-21 NOTE — Telephone Encounter (Signed)
 Left message that I spoke with Dr Jayne, will get IUD next week as planned

## 2023-10-27 ENCOUNTER — Encounter: Payer: Self-pay | Admitting: Adult Health

## 2023-10-27 ENCOUNTER — Ambulatory Visit: Admitting: Adult Health

## 2023-10-27 VITALS — BP 114/75 | HR 76 | Ht 65.0 in | Wt 182.5 lb

## 2023-10-27 DIAGNOSIS — Z30430 Encounter for insertion of intrauterine contraceptive device: Secondary | ICD-10-CM

## 2023-10-27 DIAGNOSIS — Z3202 Encounter for pregnancy test, result negative: Secondary | ICD-10-CM

## 2023-10-27 LAB — POCT URINE PREGNANCY: Preg Test, Ur: NEGATIVE

## 2023-10-27 MED ORDER — LEVONORGESTREL 20 MCG/DAY IU IUD
1.0000 | INTRAUTERINE_SYSTEM | Freq: Once | INTRAUTERINE | Status: AC
  Administered 2023-10-27: 1 via INTRAUTERINE

## 2023-10-27 NOTE — Progress Notes (Addendum)
 Patient ID: Madison Decker, female   DOB: 27-Apr-1990, 33 y.o.   MRN: [id number]   IUD INSERTION Patient name: Madison Decker MRN [id number]  Date of birth: Nov 13, 1990 Subjective Findings:   Madison Decker is a 33 y.o. G67P1001 African American female being seen today for insertion of a Mirena  IUD.  Patient's last menstrual period was 09/28/2023 (exact date). Last sexual intercourse was 09/20/23 Last pap5/15/25. Results were: ASCUS w/ HRHPV negative  The risks and benefits of the method and placement have been thouroughly reviewed with the patient and all questions were answered.  Specifically the patient is aware of failure rate of 04/998, expulsion of the IUD and of possible perforation.  The patient is aware of irregular bleeding due to the method and understands the incidence of irregular bleeding diminishes with time.  Signed copy of informed consent in chart.      10/20/2023   11:24 AM 10/18/2023    4:47 PM 08/26/2023    4:14 PM 12/07/2022    9:26 AM 01/27/2019   10:45 AM  Depression screen PHQ 2/9  Decreased Interest 0 0 0 2 0  Down, Depressed, Hopeless 0 0 0 1 0  PHQ - 2 Score 0 0 0 3 0  Altered sleeping 1  1 1  0  Tired, decreased energy 1  1 1  0  Change in appetite 0  0 1 0  Feeling bad or failure about yourself  0  0 1 0  Trouble concentrating 0  0 0 0  Moving slowly or fidgety/restless 0  0 0 0  Suicidal thoughts 0  0 0 0  PHQ-9 Score 2  2 7  0  Difficult doing work/chores    Very difficult         10/20/2023   11:24 AM 08/26/2023    4:15 PM 12/07/2022    9:27 AM  GAD 7 : Generalized Anxiety Score  Nervous, Anxious, on Edge 1 0   Control/stop worrying 0 0 1  Worry too much - different things 0  0  Trouble relaxing 0  1  Restless 0  1  Easily annoyed or irritable 0  0  Afraid - awful might happen 0  1  Total GAD 7 Score 1    Anxiety Difficulty  Not difficult at all Very difficult     Pertinent History Reviewed:   Reviewed past medical,surgical,  social, obstetrical and family history.  Reviewed problem list, medications and allergies. Objective Findings & Procedure:   Vitals:   10/27/23 1131  BP: 114/75  Pulse: 76  Weight: 182 lb 8 oz (82.8 kg)  Height: 5' 5 (1.651 m)  Body mass index is 30.37 kg/m.  Results for orders placed or performed in visit on 10/27/23 (from the past 24 hours)  POCT urine pregnancy   Collection Time: 10/27/23 11:37 AM  Result Value Ref Range   Preg Test, Ur Negative Negative     Time out was performed.  A graves speculum was placed in the vagina.  The cervix was visualized, prepped using Betadine.  The uterus was found to be neutral and it sounded to 8 cm.  Mirena   IUD placed per manufacturer's recommendations. The strings were trimmed to approximately 3 cm. The patient tolerated the procedure well.   Informal  sonogram was performed and the proper placement of the IUD was verified by me and [facility].  Chaperone: Clarita Salt Assessment & Plan:   1) Mirena  IUD insertion The patient was given post procedure  instructions, including signs and symptoms of infection and to check for the strings after each menses or each month, and refraining from intercourse or anything in the vagina for 3 days. She was given a care card with date IUD placed, and date IUD to be removed. She is scheduled for a f/u appointment in 4 weeks.  Orders Placed This Encounter  Procedures   POCT urine pregnancy      [name] NP 10/27/2023 12:10 PM

## 2023-11-24 ENCOUNTER — Encounter: Payer: Self-pay | Admitting: Adult Health

## 2023-11-24 ENCOUNTER — Ambulatory Visit: Admitting: Adult Health

## 2023-11-24 VITALS — BP 109/79 | HR 88 | Ht 65.0 in | Wt 189.0 lb

## 2023-11-24 DIAGNOSIS — Z975 Presence of (intrauterine) contraceptive device: Secondary | ICD-10-CM | POA: Insufficient documentation

## 2023-11-24 DIAGNOSIS — Z30431 Encounter for routine checking of intrauterine contraceptive device: Secondary | ICD-10-CM

## 2023-11-24 NOTE — Progress Notes (Signed)
  Subjective:     Patient ID: Madison Decker, female   DOB: 03/19/1991, 33 y.o.   MRN: [id number]  HPI Madison Decker is a 33 year old black female, single, G1P1001 in for IUD check. She had mirena  placed 10/27/23. Can feel strings, no sex yet.     Component Value Date/Time   DIAGPAP (A) 08/26/2023 1635    - Atypical squamous cells of undetermined significance (ASC-US )   DIAGPAP  01/27/2019 1100    - Negative for intraepithelial lesion or malignancy (NILM)   HPVHIGH Negative 08/26/2023 1635   HPVHIGH Negative 01/27/2019 1100   ADEQPAP  08/26/2023 1635    Satisfactory for evaluation; transformation zone component PRESENT.   ADEQPAP  01/27/2019 1100    Satisfactory for evaluation; transformation zone component PRESENT.   PCP is I Polanco, NP  Review of Systems For IUD check Can feel strings No sex yet Reviewed past medical,surgical, social and family history. Reviewed medications and allergies.     Objective:   Physical Exam BP 109/79 (BP Location: Left Arm, Patient Position: Sitting, Cuff Size: Normal)   Pulse 88   Ht 5' 5 (1.651 m)   Wt 189 lb (85.7 kg)   LMP 11/11/2023   BMI 31.45 kg/m     Skin warm and dry.Pelvic: external genitalia is normal in appearance no lesions, vagina: Pink discharge with out odor,urethra has no lesions or masses noted, cervix:smooth and bulbous, +IUD strings at os, uterus: normal size, shape and contour, non tender, no masses felt, adnexa: no masses or tenderness noted. Bladder is non tender and no masses felt.   Upstream - 11/24/23 1025       Pregnancy Intention Screening   Does the patient want to become pregnant in the next year? No    Does the patient's partner want to become pregnant in the next year? No    Would the patient like to discuss contraceptive options today? No      Contraception Wrap Up   Current Method IUD or IUS    End Method IUD or IUS    Contraception Counseling Provided Yes         Examination chaperoned by Clarita Salt LPN Assessment:     1. IUD check up (Primary) +strings at os and she can feel strings   2. IUD (intrauterine device) in place Mirena  placed 10/27/23    Plan:     Follow up prn

## 2024-01-05 ENCOUNTER — Ambulatory Visit (INDEPENDENT_AMBULATORY_CARE_PROVIDER_SITE_OTHER)

## 2024-01-05 DIAGNOSIS — Z23 Encounter for immunization: Secondary | ICD-10-CM

## 2024-01-19 NOTE — Progress Notes (Signed)
 Pulmonary Post Lung Transplant Follow Up     She comes to clinic today for ongoing evaluation and management of her lung allograft and immunosuppression.  Patient ID/HPI: Madison Decker is a 33 y.o. 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 She was admitted 01/14/23 for acute on chronic respiratory failure with expedited lung transplant evaluation. She was cannulated for VV ECMO 01/24/23, and listed for BOLT 01/26/23 [CMV:D-/R-; EBV:D+/R+]  Her post-transplant course has been notable for: - S/P BOLT 01/30/23 with out of OR open chest, continued VV ECMO. Chest closure 10/20. 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 On Reglan - BAL 10/21 w/ Stenotrophomonas- received levofloxacin and minocycline  She was ultimately discharged on 12/3 (POD#45)  (weight 156 lbs)  Post-discharge course notable for - Admitted 10/08/23 - 10/14/23 for MSSA bacteremia and PNA of the RML/RLL. eRVP Also positive for rhinovirus. TTE negative for vegetations. She was discharged on cefazolin for 14 day course  INTERVAL HISTORY 01/19/2024:  Last visit 12/10/23 1 year post TXP  Madison Decker returns for clinic followup today and follow-up of PFTs. Bronchoscopy performed at her last clinic visit showed A0B0, showed PSA and was treated with ciprofloxacin for 14 days History of Present Illness Madison Decker is a 33 year old female with a history of  lung transplant who presents for a follow-up visit.  She feels 'pretty good' and maintains an active lifestyle by walking on the treadmill at home for 15 to 20 minutes daily. No recent changes in breathing, coughing, or viral infections. She has received her flu shot and is awaiting the COVID vaccine as it was unavailable at her primary doctor's office.  Her weight has fluctuated, previously at 198 pounds and now at 203 pounds. She takes prednisone . She is also on Lopressor, taking half a pill in the morning and at bedtime for heart rate control, with generally stable blood pressure at home.  Her potassium level was elevated at 5.3 mmol/L, which she attributes to eating fries the day before. She usually avoids high-potassium foods. Her current medications include vitamin D  50,000 IU weekly, Lexapro , Noxafil, and Bactrim .  She is scheduled for a breast ultrasound in November and has an upcoming appointment with infectious disease at the end of the month. She receives monthly infusions at a clinic an hour away from her home.   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 02/03/2023   Transaminitis 02/03/2023   Past Surgical History:  Procedure Laterality Date   EXTRACORPOREAL MEMBRANE OXYGENATION (ECMO) / EXTRACORPOREAL LIFE SUPPORT (ECLS) VENO-VENOUS Left 01/24/2023   Procedure: EXTRACORPOREAL MEMBRANE OXYGENATION (ECMO)/EXTRACORPOREAL LIFE SUPPORT (ECLS) PROVIDED BY PHYSICIAN; INITIATION, VENO-VENOUS;  Surgeon: Tobie Pauline Rives, MD;  Location: [facility];  Service: Cardiothoracic;  Laterality: Left;   TRANSPLANT LUNG BILATERAL W/CPB Bilateral 01/30/2023   Procedure: LUNG TRANSPLANT, DOUBLE (BILATERAL SEQUENTIAL OR EN BLOC); WITH CARDIOPULMONARY BYPASS;  Surgeon: Elmo Cough  Carmin, MD;  Location: [facility];  Service: Cardiothoracic;  Laterality:  Bilateral;   SECONDARY CLOSURE SURGICAL WOUND CHEST N/A 01/31/2023   Procedure: SECONDARY CLOSURE OF SURGICAL WOUND OR DEHISCENCE, EXTENSIVE OR COMPLICATED; CHEST;  Surgeon: Elmo Donnice Carmin, MD;  Location: [facility];  Service: Cardiothoracic;  Laterality: N/A;   BRONCHOSCOPY FLEXIBLE Bilateral 01/31/2023   Procedure: BRONCHOSCOPY, FLEXIBLE, INCLUDING FLUOROSCOPIC GUIDANCE, WHEN PERFORMED; DIAGNOSTIC, WITH CELL WASHING, WHEN PERFORMED (SEPARATE PROCEDURE);  Surgeon: Elmo Donnice Carmin, MD;  Location: [facility];  Service: Cardiothoracic;  Laterality: Bilateral;   APPLICATION WOUND VAC N/A 01/31/2023   Procedure: NEGATIVE PRESSURE WOUND THERAPY, INCLUDING TOPICAL APPLICATION(S), ASSESSMENT, INSTRUCTION(S) FOR ONGOING CARE, PER SESSION; TOTAL WOUND(S) SURFACE AREA LESS THAN OR EQUAL TO 50 SQUARE CENTIMETERS;  Surgeon: Elmo Donnice Carmin, MD;  Location: [facility];  Service: Cardiothoracic;  Laterality: N/A;   BRONCHOSCOPY W/TRANSBRONCHIAL BIOPSY FLEXIBLE Bilateral 03/29/2023   Procedure: BRONCHOSCOPY, FLEXIBLE, INCLUDING FLUOROSCOPIC GUIDANCE, WHEN PERFORMED; WITH TRANSBRONCHIAL LUNG BIOPSY(S), SINGLE LOBE;  Surgeon: Whitson, Jordan Rockwell, MD;  [facility];  Service: Pulmonary;  Laterality: Bilateral;   BRONCHOSCOPY N/A 03/29/2023   Procedure: BRONCHOSCOPY, RIGID OR FLEXIBLE, INCLUDING FLUOROSCOPIC GUIDANCE, WHEN PERFORMED; WITH BRONCHIAL ALVEOLAR LAVAGE;  Surgeon: Whitson, Jordan , MD;  [facility];  Service: Pulmonary;  Laterality: N/A;   BRONCHOSCOPY W/TRANSBRONCHIAL BIOPSY FLEXIBLE Bilateral 05/26/2023   Procedure: BRONCHOSCOPY, FLEXIBLE, INCLUDING FLUOROSCOPIC GUIDANCE, WHEN PERFORMED; WITH TRANSBRONCHIAL LUNG BIOPSY(S), SINGLE LOBE;  Surgeon: Neysa Comer Caldron, MD;  [facility];  Service: Pulmonary;  Laterality: Bilateral;    BRONCHOSCOPY N/A 05/26/2023   Procedure: BRONCHOSCOPY, RIGID OR FLEXIBLE, INCLUDING FLUOROSCOPIC GUIDANCE, WHEN PERFORMED; WITH BRONCHIAL ALVEOLAR LAVAGE;  Surgeon: Neysa Comer Caldron, MD;  [facility];  Service: Pulmonary;  Laterality: N/A;   BRONCHOSCOPY W/TRANSBRONCHIAL BIOPSY FLEXIBLE  05/26/2023   Procedure: BRONCHOSCOPY, RIGID OR FLEXIBLE, INCLUDING FLUOROSCOPIC GUIDANCE, WHEN PERFORMED; WITH TRANSBRONCHIAL LUNG BIOPSY(S), EACH ADDITIONAL LOBE (LIST IN ADDITION TO PRIMARY PROCEDURE);  Surgeon: Neysa Comer Caldron, MD;  [facility];  Service: Pulmonary;;   EGD N/A 06/07/2023   Procedure: ESOPHAGOGASTRODUODENOSCOPY, FLEXIBLE, TRANSORAL; DIAGNOSTIC, INCLUDING COLLECTION OF SPECIMEN(S) BY BRUSHING OR WASHING, WHEN PERFORMED (SEPARATE PROCEDURE);  Surgeon: Louann Alm Hora, MD;  Location: [facility];  Service: Gastroenterology;  Laterality: N/A;   BRONCHOSCOPY W/TRANSBRONCHIAL BIOPSY FLEXIBLE Bilateral 08/20/2023   Procedure: BRONCHOSCOPY, FLEXIBLE, INCLUDING FLUOROSCOPIC GUIDANCE, WHEN PERFORMED; WITH TRANSBRONCHIAL LUNG BIOPSY(S), SINGLE LOBE;  Surgeon: Hildegard Gala Chess, MD;  [facility];  Service: Pulmonary;  Laterality: Bilateral;   BRONCHOSCOPY N/A 08/20/2023   Procedure: BRONCHOSCOPY, RIGID OR FLEXIBLE, INCLUDING FLUOROSCOPIC GUIDANCE, WHEN PERFORMED; WITH BRONCHIAL ALVEOLAR LAVAGE;  Surgeon: Hildegard Gala Chess, MD;  [facility];  Service: Pulmonary;  Laterality: N/A;   BRONCHOSCOPY W/TRANSBRONCHIAL BIOPSY FLEXIBLE  08/20/2023   Procedure: BRONCHOSCOPY, RIGID OR FLEXIBLE, INCLUDING FLUOROSCOPIC GUIDANCE, WHEN PERFORMED; WITH TRANSBRONCHIAL LUNG BIOPSY(S), EACH ADDITIONAL LOBE (LIST IN ADDITION TO PRIMARY PROCEDURE);  Surgeon: Hildegard Gala Chess, MD;  [facility];  Service:  Pulmonary;;   BRONCHOSCOPY W/TRANSBRONCHIAL BIOPSY FLEXIBLE Bilateral 11/09/2023   Procedure: BRONCHOSCOPY, FLEXIBLE, INCLUDING FLUOROSCOPIC GUIDANCE, WHEN PERFORMED; WITH TRANSBRONCHIAL LUNG BIOPSY(S), SINGLE LOBE;  Surgeon: Jesus Almond Sieving, MD;  [facility];  Service: Pulmonary;  Laterality: Bilateral;   BRONCHOSCOPY N/A 11/09/2023   Procedure: BRONCHOSCOPY, RIGID  OR FLEXIBLE, INCLUDING FLUOROSCOPIC GUIDANCE, WHEN PERFORMED; WITH BRONCHIAL ALVEOLAR LAVAGE;  Surgeon: Jesus Almond Sieving, MD;  [facility];  Service: Pulmonary;  Laterality: N/A;   Social History   Socioeconomic History   Marital status: Single  Tobacco Use   Smoking status: Never    Passive exposure: Past (pt has had tobacco before, not an active smoker, rarely uses)   Smokeless tobacco: Never  Vaping Use   Vaping status: Never Used  Substance and Sexual Activity   Alcohol use: Not Currently   Drug use: Never   Social Drivers of Health   Financial Resource Strain: Low Risk  (10/20/2023)   Received from [facility]   Overall Financial Resource Strain (CARDIA)    How hard is it for you to pay for the very basics like food, housing, medical care, and heating?: Not hard at all  Food Insecurity: No Food Insecurity (10/20/2023)   Received from [facility]   Hunger Vital Sign    Within the past 12 months, you worried that your food would run out before you got the money to buy more.: Never true    Within the past 12 months, the food you bought just didn't last and you didn't have money to get more.: Never true  Transportation Needs: No Transportation Needs (10/20/2023)   Received from [facility] - Transportation    In the past 12 months, has lack of transportation kept you from medical appointments or from getting medications?: No    In the past 12 months, has lack of transportation kept you from meetings, work, or  from getting things needed for daily living?: No  Physical Activity: Insufficiently Active (10/20/2023)   Received from [facility]   Exercise Vital Sign    On average, how many days per week do you engage in moderate to strenuous exercise (like a brisk walk)?: 3 days    On average, how many minutes do you engage in exercise at this level?: 30 min  Stress: No Stress Concern Present (10/20/2023)   Received from [facility] - Occupational Stress Questionnaire    Do you feel stress - tense, restless, nervous, or anxious, or unable to sleep at night because your mind is troubled all the time - these days?: Not at all  Social Connections: Moderately Isolated (10/20/2023)   Received from [facility]   Social Connection and Isolation Panel    In a typical week, how many times do you talk on the phone with family, friends, or neighbors?: Three times a week    How often do you get together with friends or relatives?: Once a week    How often do you attend church or religious services?: 1 to 4 times per year    Do you belong to any clubs or organizations such as church groups, unions, fraternal or athletic groups, or school groups?: No    How often do you attend meetings of the clubs or organizations you belong to?: Never    Are you married, widowed, divorced, separated, never married, or living with a partner?: Never married  Housing Stability: Low Risk  (10/11/2023)   Housing Stability Vital Sign    Unable to Pay for Housing in the Last Year: No    Number of Times Moved in the Last Year: 0    Homeless in the Last Year: No   Family History  Problem Relation Age of Onset  Breast cancer Maternal Grandmother    Anesthesia problems Neg Hx    Ovarian cancer Neg Hx     A comprehensive 12 point ROS was undertaken with Ms. Gora and is notable for the positive and relevant negative responses as documented in the HPI/interval history, and all other  systems are negative.   Current Outpatient Medications  Medication Sig Dispense Refill   acetaminophen  (TYLENOL ) 500 MG tablet Take 1 tablet (500 mg total) by mouth every 4 (four) hours as needed for Pain 100 tablet 0   acyclovir (ZOVIRAX) 400 MG tablet Take 1 tablet (400 mg total) by mouth every 12 (twelve) hours 180 tablet 3   amikacin liposomal (ARIKAYCE) 590 mg/8.4 mL inhalation suspension Inhale 8.4 mLs (590 mg total) into the lungs once daily 252 mL 11   cholecalciferol (VITAMIN D3) 1,250 mcg (50,000 unit) capsule Take 1 capsule by mouth once a week 12 capsule 1   cyanocobalamin (VITAMIN B12) 1000 MCG tablet Take 1 tablet (1,000 mcg total) by mouth once daily 100 tablet 3   escitalopram  oxalate (LEXAPRO ) 20 MG tablet Take 1 tablet (20 mg total) by mouth once daily 90 tablet 3   FUROsemide (LASIX) 20 MG tablet Take 1 tablet (20 mg) by mouth once daily as needed for Edema (If you gain 5 pounds or more in 2 days, contact your coordinator for instruction) 30 tablet 1   guanFACINE (TENEX) 1 MG tablet Take 0.5 tablet (0.5 mg total) by mouth at bedtime 45 tablet 3   immune globulin (GAMMAGARD S/D) 10 % injection Inject 400 mLs (40 g total) into the vein monthly Pharmacy to determine diluent , concentration, and rate of administration. Next dose due 11/04/23     levonorgestreL  (MIRENA  52 MG) IUD Insert 1 each into the uterus once     magnesium oxide-magnesium amino acid chelate (MG-PLUS-PROTEIN) tablet Take 3 tablets by mouth 3 (three) times daily with meals 810 tablet 3   metoprolol TARTrate (LOPRESSOR) 25 MG tablet Take 0.5 tablet (12.5 mg total) by mouth every 12 (twelve) hours 90 tablet 3   mycophenolate (CELLCEPT) 250 mg capsule Take 4 capsules (1,000 mg total) by mouth every 12 (twelve) hours 120 capsule 11   omadacycline (NUZYRA) 150 mg tablet Take 2 tablets (300 mg total) by mouth once daily 60 tablet 11   omeprazole (PRILOSEC) 20 MG DR capsule Take 1 capsule (20 mg total) by  mouth once daily 90 capsule 3   ondansetron  (ZOFRAN -ODT) 4 MG disintegrating tablet Take 1 tablet (4 mg total) by mouth every 8 (eight) hours as needed for Nausea or Vomiting 30 tablet 2   polyethylene glycol (MIRALAX) powder Take 17 g by mouth once daily as needed for Constipation     posaconazole (NOXAFIL) 100 mg DR tablet Take 3 tablets (300 mg total) by mouth once daily 90 tablet 11   predniSONE  (DELTASONE ) 5 MG tablet Take 1 tablet (5 mg total) by mouth once daily 30 tablet 11   sennosides-docusate (SENOKOT-S) 8.6-50 mg tablet Take 2 tablets by mouth 2 (two) times daily as needed for Constipation 120 tablet 2   sulfamethoxazole -trimethoprim  (BACTRIM  SS) 400-80 mg tablet Take 1 tablet (80 mg of trimethoprim  total) by mouth once daily 90 tablet 3   tacrolimus (PROGRAF) 0.5 MG capsule Take two of the 1mg  capsules every 12 hours. ( 2mg  in am and 2mg  in pm ) 30 capsule 11   tacrolimus (PROGRAF) 1 MG capsule Take two of the 1mg  capsules every 12 hours. ( 2mg  in  am and 2mg  in pm ) 150 capsule 11   tacrolimus (PROGRAF) 5 MG capsule Take two of the 1mg  capsules every 12 hours. ( 2mg  in am and 2mg  in pm ) 180 capsule 3   tedizolid (SIVEXTRO) 200 mg Tab tablet Take 1 tablet (200 mg total) by mouth once daily 30 tablet 11   sterile water irrigation solution Irrigate with 12,000 mLs as directed (Patient not taking: Reported on 01/19/2024)     No current facility-administered medications for this visit.   Allergies  Allergen Reactions   Sesame Seed Other (See Comments)    Swelling of the lips   Nsaids (Non-Steroidal Anti-Inflammatory Drug) Other (See Comments)    S/p BOLT   Immunization History  Administered Date(s) Administered   Covid-19 Vaccine 91mcg/0.5ml (>=42yrs) SPIKEVAX Moderna (NON-FORMULARY AT [facility]) 01/21/2023   Flu Vaccine IIV3, IM PF (73mo+)(Fluarix, FluLaval, Fluzone ) 12/17/2022, 01/05/2024   HEP B (>=44YR) VACCINE (ENGERIX-B/RECOMBIVAX) 01/21/2023, 01/28/2023   HPV  Quadrivalent 07/15/2011, 05/22/2013   Influenza IIV4, IM pres-free 01/27/2019   Influenza, IM unspecified 06/03/2022   Pneumococcal (PCV20) (>=6WKS) vaccine (Prevnar 20 ) (aka PCV 20) 12/14/2022   TDAP (>=46YR) VACCINE (ADACEL/BOOSTRIX ) 01/13/2005, 10/17/2013, 05/08/2019   I have reviewed the patient's medical history, surgical history, allergies, medications, social and family history in detail and updated the electronic medical record.  Objective: Physical Exam BP 128/81 (BP Location: Left upper arm, Patient Position: Sitting, BP Cuff Size: Adult)   Pulse 76   Temp 36.7 C (98.1 F) (Oral)   Resp 16   Ht 165.1 cm (5' 5)   Wt 92.2 kg (203 lb 4.2 oz)   LMP 01/05/2024 (Exact Date)   SpO2 97% Comment: Room air  BMI 33.82 kg/m   BP Readings from Last 3 Encounters:  01/19/24 128/81  01/19/24 (!) 151/86  12/28/23 107/68   Wt Readings from Last 3 Encounters:  01/19/24 92.2 kg (203 lb 4.2 oz)  12/28/23 90.4 kg (199 lb 4.7 oz)  12/20/23 90.2 kg (198 lb 13.7 oz)   Constitutional: Well-appearing, no acute distress, sitting comfortably; accompanied by parents HEENT:Sclerae anicteric. No thrush or oral ulcers Neck: Neck supple. No lymphadenopathy.  Cardiovascular: Regular rate and rhythm. No rubs, murmurs or gallups. Pulmonary/Chest: Normal work of breathing. Lungs clear to auscultation bilaterally Abdominal: Soft. Nontender, nondistended. Bowel sounds are normal.  Extremities: negative for clubbing, No cyanosis or edema. Neurological: Alert and oriented to person, place, and time.   Moves all extremities well. Skin: Skin is warm and dry. Incision c/d/i Psychiatric: Normal mood and affect.   DATA:  PFTs:     Latest Ref Rng & Units 08/20/2023    7:54 AM 11/09/2023   10:25 AM 12/10/2023    8:55 AM 01/19/2024    9:31 AM  Spriometry  FVC Pre L 3.02  2.47  2.84  2.71  P  FEV1 Pre L 2.45  1.53  1.82  2.04  P  FEV1/FVC Pre % 80.99  61.98  64.19  75.26  P  FEF25-75% Pre L/s 2.33   0.66  0.80  1.57  P    P Preliminary result   Labs: Recent Labs    11/08/23 0810 12/10/23 0803 01/19/24 0841  NA 137 138 139  K 4.1 4.6 5.3*  CL 104 106 104  CO2 22 22 27   BUN 22* 31* 29*  CREATININE 1.7* 1.8* 1.7*  GLUCOSE 95 83 94  GFR 41 38 41  ANIONGAP 11 10 8   MG 1.8 2.0 2.2  CALCIUM 9.2  9.6 9.5  TOTALPROTEIN 7.6 7.8 7.7  ALB 3.4* 3.5 3.5  TBILI 0.6 0.5 0.6  AST 16 20 29   ALT 9* 9* 11  ALKPHOS 45 53 49   Recent Labs    01/30/23 0542 01/30/23 1204 10/25/23 0940 11/01/23 0816 11/08/23 0810 12/10/23 0803 01/19/24 0841  WBC 11.6*   < > 7.7 7.8 7.3 10.7* 8.7  HGB 6.4*   < > 10.9* 11.9 11.2* 11.6* 12.2  HCT 19.7*   < > 35.1 38.4 35.3 37.0 38.7  PLT 63*   < > 379 248 204 277 283  MCV 92   < > 92 94 92 93 93  RDWCV 20.0*   < > 13.8 14.1 14.0 14.5 14.4  NEUTOPHILPCT 71.9  --  74.5 75.5  --   --   --   LYMPHOPCT 11.5  --  9.1* 9.2*  --   --   --   MONOPCT 11.7  --  12.9* 11.9  --   --   --   EOSPCT 0.0  --  1.8 1.9  --   --   --   BASOPCT 0.2  --  0.7 0.5  --   --   --   IGP 4.7*  --  1.0* 1.0*  --   --   --   NEUTCT 8.3  --  5.7 5.9  --   --   --   LYMPHCT 1.3  --  0.7 0.7  --   --   --   MONOCT 1.4*  --  1.0* 0.9  --   --   --   EOSCT 0.00  --  0.14 0.15  --   --   --   BASOCT 0.02  --  0.05 0.04  --   --   --   IMMGRANCOUNT 0.54*  --  0.08* 0.08*  --   --   --    < > = values in this interval not displayed.   Recent Labs    10/10/23 1555 11/08/23 0810 01/19/24 0841  PT 13.2* 11.9 11.9  INR 1.1 1.0 1.0  APTT 26.4* 26.6* 26.7*   FK506 (Tacrolimus)  Date Value Ref Range Status  12/10/2023 11.0 5.0 - 20.0 ng/mL Final  11/08/2023 8.2 5.0 - 20.0 ng/mL Final  11/01/2023 13.7 5.0 - 20.0 ng/mL Final  10/12/2023 9.0 5.0 - 20.0 ng/mL Final  08/20/2023 7.3 5.0 - 20.0 ng/mL Final   Tacrolimus (FK506) External  Date Value Ref Range Status  10/26/2023 14.2 5.0 - 20.0 ng/mL Final  10/18/2023 24.0 (H) 5.0 - 20.0 ng/mL Final  09/21/2023 8.4 5.0 - 20.0 ng/mL  Final  09/02/2023 8.0 5.0 - 20.0 ng/mL Final  06/23/2023 10.4 5.0 - 20.0 ng/mL Final   CMV DNA QUANT By PCR, Plasma  Date Value Ref Range Status  12/10/2023 Not Detected Not Detected Final  11/08/2023 Not Detected Not Detected Final  10/10/2023 Not Detected Not Detected Final  08/20/2023 Not Detected Not Detected Final  05/25/2023 Not Detected Not Detected Final  04/23/2023 Not Detected Not Detected Final  04/02/2023 Not Detected Not Detected Final  03/26/2023 Not Detected Not Detected Final  03/19/2023 Not Detected Not Detected Final  03/05/2023 Not Detected Not Detected Final  02/19/2023 Not Detected Not Detected Final  02/08/2023 Not Detected Not Detected Final   HLA Antibodies: PRE TRANSPLANT 01/15/23: screen- class I = 68%, class 2 = 99% @ 1:1 (class 1=0%, class 2=41% @ 1:16)  Class 1= A2 A23 A24 A68  A69 B35 B51 B53 (not present at 1:16)  Class 2= DR11 DQA1*02:01 DQA1*03:01 DQA1*03:02 DQA1*03:03 DQA1*04:01 DQA1*05:01 DQA1*05:03 DQA1*05:05 DQA1*06:01 DQ2 DQ4 DQ7 DQ8 DQ9 DP2 DP3 DPB1*04:02 DP6 DP9 DP10 DP14 DP17 DP18 DP20 DP28 (DR11 DQA1*04:01 DQA1*05:01 DQA1*05:03 DQA1*05:05 DQA1*06:01 DQ7 DQ8 DQ9 DP2 DP3 DPB1*04:02 DP6 DP9 DP10 DP14 DP17 DP18 DP20 DP28 present at 1:16) 01/16/23: screen- class I = 54%, class 2 = 99% @ 1:1 (class 1=TU%, class 2=95% @ 1:16) 01/30/23: screen- class I = 10%, class 2 = 90% @ 1:1 (class 1=0%, class 2=46% @ 1:16)  Class 1= 17.08 (A*02:06 A25 A*34:01 A66 A68 , no DSA)  Class 2= 96.84 (DR1 DR11 DQ2 DQ4 DQ7 DQ8 DQ9 DQA1*02:01 DQA1*03:01 DQA1*03:02 DQA1*03:03 DQA1*04:01 DQA1*05:01 DQA1*05:03 DQA1*05:05 DQA1*06:01 DP2 DP3 DP6 DP9 DP10 DP14 DP17 DP18 DP20 DP28 DPB1*04:02, no DSA)  POST-TRANSPLANT FLOW CROSSMATCH - T cell negative, B cell positive (MCS 45.0, 53.5. Current cutoff is 140) 01/30/23: screen- class I = 4%, class 2 = 89%  Class 1= 12.60 (A*02:06 A68 , no DSA)  Class 2= 92.04 (DR11 DQA1*05:01 DQA1*05:03 DQA1*05:05 DQA1*06:01 DQ4 DQ7 DQ8 DQ9 DP2 DP3  DPB1*04:02 DP9 DP10 DP14 DP17 DP18 DP20 DP28, DSA to DP3)  02/03/23: screen- class I = 47%, class 2 = 76%  Class 1= 69.43 (A2 A23 A24 A68 A69, no DSA)  Class 2= 95.04 (DR11 DQA1*02:01 DQA1*03:01 DQA1*03:02 DQA1*03:03 DQA1*04:01 DQA1*05:01 DQA1*05:03 DQA1*05:05 DQA1*06:01 DQ2 DQ4 DQ7 DQ8 DQ9 DP2 DP3 DPB1*04:02 DP9 DP10 DP14 DP17 DP18 DP20 DP28, DSA to DP3- not present at 1:16)  02/10/23: screen- class I = 88%, class 2 = 93%  Class 1= 95.93 (A1 A2 A3 A11 A23 A24 A36 A68 A69 B44 B45 B53 B82 , DSA to A3- MFI 993, not present at 1:16)  Class 2= 95.04 (DR11 DQ7 DQ8 DQ9 DP2 DP3 DPB1*04:02 DP6 DP9 DP10 DP14 DP17 DP18 DP20 DP28, DSA to DP3- MFI 8565 at 1:1, MFI 1379 at 1:16)  02/17/23: screen- class I = 46%, class 2 = 100%  Class 1= 95.03 (A1 A2 A3 A11 A23 A24 A68 A69 B44 B45 B82 , DSA to A3- MFI 543, not present at 1:16)  Class 2= 95.20 (DR4 DR7 DR11 DQA1*02:01 DQA1*03:01 DQA1*03:02 DQA1*03:03 DQA1*04:01 DQA1*05:01 DQA1*05:03 DQA1*05:05 DQA1*06:01 DQ2 DQ4 DQ7 DQ8 DQ9 DP2 DP3 DPB1*04:02 DP6 DP9 DP10 DP14 DP17 DP18 DP20 DP28, DSA to DP3- MFI 5125 at 1:1, not present at 1:16)  02/24/23: screen- class I = 48%, class 2 = 100%  Class 1= 75.56 (A2 A23 A24 A68 A69 B44 B45 B82 , no DSAs)  Class 2= 95.20 (DR4 DR7 DR11 DQA1*02:01 DQA1*03:01 DQA1*03:02 DQA1*03:03 DQA1*04:01 DQA1*05:01 DQA1*05:03 DQA1*05:05 DQA1*06:01 DQ2 DQ4 DQ7 DQ8 DQ9 DP2 DP3 DPB1*04:02 DP6 DP9 DP10 DP14 DP17 DP18 DP20 DP28, DSA to DP3- MFI 5591 at 1:1, not present at 1:16)  03/03/23: screen- class I = 46%, class 2 = 98%  Class 1= 92.66 (A1 A2 A11 A23 A24 A68 A69 B35 B44 B45 B53 B76 B82 , no DSAs)  Class 2= 95.28 (DR4 DR7 DR9 DR11 DR12 DQA1*02:01 DQA1*03:01 DQA1*03:02 DQA1*03:03 DQA1*04:01 DQA1*05:01 DQA1*05:03 DQA1*05:05 DQA1*06:01 DQ2 DQ4 DQ7 DQ8 DQ9 DP2 DP3 DPB1*04:02 DP6 DP9 DP10 DP14 DP17 DP18 DP20 DP28, DSA to DP3- MFI 4759 at 1:1, not present at 1:16)  03/26/23:: screen- class I = 42%, class 2 = 97%  Class 1= 69.37 (A2 A23 A24 A68 A69 , no  DSAs)  Class 2= 95.17 (DR11 DQA1*02:01 DQA1*03:01 DQA1*03:02 DQA1*03:03 DQA1*04:01 DQA1*05:01 DQA1*05:03 DQA1*05:05 DQA1*06:01 DQ2 DQ4 DQ7 DQ8 DQ9 DP2 DP3  DPB1*04:02 DP6 DP9 DP10 DP14 DP17 DP18 DP20 DP28 , DSA to DP3- MFI 2752 at 1:1, not present at 1:16)  04/23/23:: screen- class I = 49%, class 2 = 99%  Class 1= 76.84 (A2 A23 A24 A68 A69 B44 B45 B53  , no DSAs)  Class 2= 95.17 (DR7 DR11 DQA1*02:01 DQA1*03:01 DQA1*03:02 DQA1*03:03 DQA1*04:01 DQA1*05:01 DQA1*05:03 DQA1*05:05 DQA1*06:01 DQ2 DQ4 DQ7 DQ8 DQ9 DP2 DP3 DPB1*04:02 DP6 DP9 DP10 DP14 DP17 DP18 DP20 DP28 , DSA to DP3- MFI 3558 at 1:1, not present at 1:16)  05/25/23:: screen- class I = 48%, class 2 = 82%  Class 1= 69.37 (A2 A23 A24 A68 A69 , no DSAs)  Class 2= 95.17 (DDR11 DQA1*02:01 DQA1*03:01 DQA1*03:02 DQA1*03:03 DQA1*04:01 DQA1*05:01 DQA1*05:03 DQA1*05:05 DQA1*06:01 DQ2 DQ4 DQ7 DQ8 DQ9 DP2 DP3 DPB1*04:02 DP6 DP9 DP10 DP14 DP17 DP18 DP20 DP28  , DSA to DP3- MFI 1989 at 1:1, not present at 1:16)  08/20/23:: screen- class I = 48%, class 2 = 88%  Class 1= 69.37 (A2 A23 A24 A68 A69 , no DSAs)  Class 2= 95.17 (DR11 DQA1*02:01 DQA1*03:01 DQA1*03:02 DQA1*03:03 DQA1*04:01 DQA1*05:01 DQA1*05:03 DQA1*05:05 DQA1*06:01 DQ2 DQ4 DQ7 DQ8 DQ9 DP2 DP3 DPB1*04:02 DP9 DP10 DP14 DP17 DP18 DP20 DP28, DSA to DP3- MFI 1355 at 1:1, not present at 1:16)  11/08/23:: screen- class I = 48%, class 2 = 88%  Class 1= 69.37 (A2 A23 A24 A68 A69 , no DSAs)  Class 2= 95.17 (DR7 DR11 DQA1*02:01 DQA1*03:01 DQA1*03:02 DQA1*03:03 DQA1*04:01 DQA1*05:01 DQA1*05:03 DQA1*05:05 DQA1*06:01 DQ2 DQ4 DQ7 DQ8 DQ9 DP2 DP3 DPB1*04:02 DP6 DP9 DP10 DP14 DP17 DP18 DP20 DP28, DSA to DP3- MFI 1730 at 1:1, not present at 1:16)   Significant Micro 01/30/23- Donor BAL- rare MRSA 02/01/23- BAL- rare Stenotrophomonas maltophilia 10/09/23- Bcx (OSH)- MSSA (with PNA and bacteremia) 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 Right native lung and  left native lung, pneumonectomy for transplant x2:   Right lung, 483 grams, and left lung, 459 grams, with diffuse, fibrotic alveolar septal expansion which is dominant in the lower lobes accompanied by focal areas of ongoing injury. See comment.   Comment: The morphologic findings are of areas of diffuse alveolar septal expansion by dense fibrosis. There are small foci of exudative and organizing airspace injury (alveolitis), consistent with active progression of disease. 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   TBBX 03/29/23 A0B0 05/26/23 A0B0 08/20/23  A0B0 11/09/23 A0B0  Chest radiograph not performed  Assessment: Lima Rapaport is a 33 y.o. 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  She comes to clinic today for ongoing evaluation and management of her lung allograft and immunosuppression.   Plan: Lung allograft status:  PFTs (reviewed and compared with priors by me) today, with improvement noted.  Most recent bronch 11/09/23 with no rejection, presence of PSA which was treated with ciprofloxacin.   Plan for repeat bronchoscopy today. Would prefer to avoid steroid given history of psychosis, will see if biopsies with presence of rejection noted rather than empiric treatment  HLA HLAs with historically highly sensitized, presence of positive B cell crossmatch (but below level of MCS cutoff). 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  Received PLEX, carfilzomab, rituximab, IVIG for highly sensitized host. Continues on IVIG, would likely plan to continue  IVIG monthly given persistence of DR3 DSA.  Last MFI DP3--1730 but will need to get results from August and today  If stable could space out the IVIG   Immunosuppression:   The patient is currently on tacrolimus, mycophenolate mofetil, and prednisone . FK506:  Lab Results  Component Value Date   FK506 11.0 12/10/2023   FK506 8.2 11/08/2023   FK506 13.7 11/01/2023   FK goal is 10-12. We will adjust the dose accordingly. From a bone marrow and side effect standpoint, She is tolerating mycophenolate mofetil and prednisone .    CMV/EBV Status: The patient is CMV donor negative/recipient negative which puts the patient at low risk for CMV disease.  We will continue acyclovir for 3 months for general viral prophylaxis. Stop ACV.  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    Await BAL from today and get a CT chest (postponed) to next week.  If  recurrent isolation of Mycobacterium abscessus,  would start anti-mycobacterial therapy  ID following for (iv imipenem, oral tedizolid, inhaled amikacin, and possibly oral omadacycline).   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  Estimated Creatinine Clearance: 53.8 mL/min (A) (based on SCr of 1.7 mg/dL (H)).  This is not around her baseline.  We will continue monitoring renal  function closely, avoiding nephrotoxic agents, minimizing diuretics, and dosing medications based on renal function.    Cardiac  Pre-transplant heart cath with PA 21/6/14, PCWP 6, CO 5.2, CI 2.9, PVR 1.5 Tachycardia noted post-procedure, on metoprolol   Gastrointestinal GERD, patulous esophagus - PPI- omeprazole 20 mg daily. pH probe 3/17 with [place] 3.3, normal manometry Gastroparesis- SGES 11/13 moderately delayed at 2 and 4 hours (32% - NL > 40%, 59% - NL > 90% respectively). Currently tolerating diet without symptoms Cleared for reg/thins 10/22 Tolerating diet- weight fairly stable  Heme Counts reviewed & stable  Neuro/Psych/Pain  Post-operative psychosis, delirium, query PTSD- on escitalopram , guanfacine, vitamin B12. Continues to follow-up with Dr Delores. If need for steroids, may consider giving Zyprexa  Endocrine Diabetes/Steroids- not needing SSI at this time Vitamin D - on replacement (high dose- 50K units weekly) given last level of only 19 on 10/4   Health Maintenance:  Covid Vaccine:  Flu Vaccine TDAP:  Pneumovax:  Prevnair: Shingrix:  Colonoscopy: Dexa Scan: Mammogram: 08/18/23 presence of 0.5 cm mass in L breast, likely benign, US  in 6 months Pap smear:  Skin exam:   Sahira Hach is a complicated patient with multiple medical problems including lung transplant and immunosuppression management. Greater than 45 minutes was spent coordinating this patient's care.  SUMMARY PLAN (01/19/2024): - Stop acyclovir - US  of breast scheduled - Your PFTs are slightly up from the last drop, good to see that you are doing better  - Continue to exercise regularly - flu and  COVID vaccines -Your potassium was high today. Please watch out and avoid high potassium diet like potatoes,fries, bananas, citrus and monitor labels. We will need to repeat labs based on prograf level   Attestation Statement:   I personally performed the service. (TP)  HAKIM AZFAR ALI, MD

## 2024-02-28 ENCOUNTER — Ambulatory Visit: Admitting: Family Medicine

## 2024-04-17 ENCOUNTER — Emergency Department (HOSPITAL_COMMUNITY)

## 2024-04-17 ENCOUNTER — Other Ambulatory Visit: Payer: Self-pay

## 2024-04-17 ENCOUNTER — Encounter (HOSPITAL_COMMUNITY): Payer: Self-pay

## 2024-04-17 ENCOUNTER — Emergency Department (HOSPITAL_COMMUNITY)
Admission: EM | Admit: 2024-04-17 | Discharge: 2024-04-17 | Disposition: A | Discharge status: Home / Self Care | Attending: Emergency Medicine | Admitting: Emergency Medicine

## 2024-04-17 DIAGNOSIS — E860 Dehydration: Secondary | ICD-10-CM | POA: Diagnosis not present

## 2024-04-17 DIAGNOSIS — J101 Influenza due to other identified influenza virus with other respiratory manifestations: Secondary | ICD-10-CM | POA: Insufficient documentation

## 2024-04-17 DIAGNOSIS — R519 Headache, unspecified: Secondary | ICD-10-CM | POA: Diagnosis present

## 2024-04-17 LAB — CBC WITH DIFFERENTIAL/PLATELET
Basophils Absolute: 0 K/uL (ref 0.0–0.1)
Basophils Relative: 0 %
Eosinophils Absolute: 0.2 K/uL (ref 0.0–0.5)
Eosinophils Relative: 3 %
HCT: 37.8 % (ref 36.0–46.0)
Hemoglobin: 11.5 g/dL — ABNORMAL LOW (ref 12.0–15.0)
Lymphocytes Relative: 6 %
Lymphs Abs: 0.4 K/uL — ABNORMAL LOW (ref 0.7–4.0)
MCH: 28.3 pg (ref 26.0–34.0)
MCHC: 30.4 g/dL (ref 30.0–36.0)
MCV: 93.1 fL (ref 80.0–100.0)
Monocytes Absolute: 0.5 K/uL (ref 0.1–1.0)
Monocytes Relative: 8 %
Neutro Abs: 5.6 K/uL (ref 1.7–7.7)
Neutrophils Relative %: 83 %
Platelets: 332 K/uL (ref 150–400)
RBC: 4.06 MIL/uL (ref 3.87–5.11)
RDW: 13.9 % (ref 11.5–15.5)
Smear Review: NORMAL
WBC: 6.8 K/uL (ref 4.0–10.5)
nRBC: 0 % (ref 0.0–0.2)

## 2024-04-17 LAB — COMPREHENSIVE METABOLIC PANEL WITH GFR
ALT: <5 U/L (ref 0–44)
AST: 27 U/L (ref 15–41)
Albumin: 3.8 g/dL (ref 3.5–5.0)
Alkaline Phosphatase: 82 U/L (ref 38–126)
Anion gap: 12 (ref 5–15)
BUN: 20 mg/dL (ref 6–20)
CO2: 22 mmol/L (ref 22–32)
Calcium: 9 mg/dL (ref 8.9–10.3)
Chloride: 103 mmol/L (ref 98–111)
Creatinine, Ser: 1.25 mg/dL — ABNORMAL HIGH (ref 0.44–1.00)
GFR, Estimated: 59 mL/min — ABNORMAL LOW
Glucose, Bld: 93 mg/dL (ref 70–99)
Potassium: 4.6 mmol/L (ref 3.5–5.1)
Sodium: 137 mmol/L (ref 135–145)
Total Bilirubin: 0.4 mg/dL (ref 0.0–1.2)
Total Protein: 7.4 g/dL (ref 6.5–8.1)

## 2024-04-17 LAB — GROUP A STREP BY PCR: Group A Strep by PCR: NOT DETECTED

## 2024-04-17 LAB — RESP PANEL BY RT-PCR (RSV, FLU A&B, COVID)  RVPGX2
Influenza A by PCR: POSITIVE — AB
Influenza B by PCR: NEGATIVE
Resp Syncytial Virus by PCR: NEGATIVE
SARS Coronavirus 2 by RT PCR: NEGATIVE

## 2024-04-17 MED ORDER — OSELTAMIVIR PHOSPHATE 75 MG PO CAPS: 75.0000 mg | ORAL_CAPSULE | Freq: Two times a day (BID) | ORAL | 0 refills | Status: AC

## 2024-04-17 MED ORDER — OSELTAMIVIR PHOSPHATE 75 MG PO CAPS
75.0000 mg | ORAL_CAPSULE | Freq: Once | ORAL | Status: AC
  Administered 2024-04-17: 75 mg via ORAL
  Filled 2024-04-17: qty 1

## 2024-04-17 NOTE — ED Provider Notes (Signed)
 " [place] EMERGENCY DEPARTMENT AT [facility] Provider Note   CSN: [id number] Arrival date & time: 04/17/24  1636     Patient presents with: Headache   Madison Decker is a 34 y.o. female.  Patient with past history significant for interstitial lung disease, bilateral lung transplant who presents to the emergency department today with concerns of a headache, earache, and sore throat.  Denies any sick contacts.  No reported shortness of breath or chest pain.  She is unsure if she is currently on who was sick recently although she does report that her family at home has been sick with somewhat similar symptoms although not as notable as her current course of illness.  Reports symptoms started yesterday.   Headache      Prior to Admission medications  Medication Sig Start Date End Date Taking? Authorizing Provider  oseltamivir  (TAMIFLU ) 75 MG capsule Take 1 capsule (75 mg total) by mouth every 12 (twelve) hours for 5 days. 04/17/24 04/22/24 Yes Bianka Liberati A, PA-C  acetaminophen  (TYLENOL ) 500 MG tablet Take by mouth every 6 (six) hours as needed for moderate pain (pain score 4-6). 05/04/23   [provider]  acyclovir (ZOVIRAX) 400 MG tablet Take 400 mg by mouth 2 (two) times daily.    [provider]  Cholecalciferol (VITAMIN D3) 25 MCG (1000 UT) CAPS Take 1 capsule (1,000 Units total) by mouth daily. 12/12/22   Del Orbe Polanco, Iliana, FNP  cyanocobalamin (VITAMIN B12) 1000 MCG tablet Take 1,000 mcg by mouth daily. 05/04/23 05/03/24  [provider]  EPINEPHrine  0.3 mg/0.3 mL IJ SOAJ injection Inject 0.3 mg into the muscle as needed for anaphylaxis. 03/13/20   Booker Darice SAUNDERS, FNP  escitalopram  (LEXAPRO ) 10 MG tablet Take 15 mg by mouth daily. Patient taking differently: Take 10 mg by mouth daily.    [provider]  furosemide (LASIX) 20 MG tablet Take 20 mg by mouth daily as needed for fluid. 05/04/23 05/03/24  [provider]   gabapentin  (NEURONTIN ) 100 MG capsule Take 1 capsule (100 mg total) by mouth 3 (three) times daily. 12/07/22   Del Wilhelmena Lloyd Sola, FNP  guanFACINE (TENEX) 1 MG tablet Take 0.5 mg by mouth at bedtime.    [provider]  levonorgestrel  (MIRENA ) 20 MCG/DAY IUD 1 each by Intrauterine route once.    [provider]  metoprolol tartrate (LOPRESSOR) 25 MG tablet Take 12.5 mg by mouth 2 (two) times daily. 05/04/23 05/03/24  [provider]  Multiple Vitamins-Minerals (WOMENS MULTIVITAMIN) TABS Take 1 tablet by mouth daily.    [provider]  mycophenolate (CELLCEPT) 250 MG capsule Take 250 mg by mouth 2 (two) times daily.    [provider]  omeprazole (PRILOSEC) 20 MG capsule Take 20 mg by mouth daily. 05/04/23   [provider]  ondansetron  (ZOFRAN -ODT) 4 MG disintegrating tablet Take 1 tablet by mouth every 8 (eight) hours as needed. 05/04/23   [provider]  pantoprazole  (PROTONIX ) 40 MG tablet Take 1 tablet (40 mg total) by mouth daily. 01/07/23   Kassie Acquanetta Bradley, MD  polyethylene glycol powder [facility]) 17 GM/SCOOP powder Take 17 g by mouth daily as needed for mild constipation. 05/04/23   [provider]  predniSONE  (DELTASONE ) 5 MG tablet Take 15 mg by mouth daily. 05/04/23   [provider]  Specialty Vitamins Products (MAGNESIUM, AMINO ACID CHELATE,) 133 MG tablet Take 3 tablets by mouth 3 (three) times daily. 07/29/23   [provider]  Specialty Vitamins Products (MG PLUS PROTEIN) 133 MG TABS Take 3 tablets by mouth 3 (three) times daily. 05/04/23   [provider]  tacrolimus (PROGRAF) 0.5 MG capsule Take 0.5 mg by mouth daily. 08/23/23 08/22/24  [provider]    Allergies: Sesame oil and Nsaids    Review of Systems  Neurological:  Positive for headaches.  All other systems reviewed and are negative.   Updated Vital Signs BP 121/74 (BP Location: Right Arm)   Pulse 84    Temp 98.1 F (36.7 C) (Oral)   Resp 20   Ht 5' 5 (1.651 m)   Wt 90.7 kg   SpO2 96%   BMI 33.28 kg/m   Physical Exam Vitals and nursing note reviewed.  Constitutional:      General: She is not in acute distress.    Appearance: She is well-developed.  HENT:     Head: Normocephalic and atraumatic.  Eyes:     Conjunctiva/sclera: Conjunctivae normal.  Cardiovascular:     Rate and Rhythm: Normal rate and regular rhythm.     Heart sounds: No murmur heard. Pulmonary:     Effort: Pulmonary effort is normal. No respiratory distress.     Breath sounds: Normal breath sounds. No wheezing, rhonchi or rales.  Abdominal:     Palpations: Abdomen is soft.     Tenderness: There is no abdominal tenderness.  Musculoskeletal:        General: No swelling.     Cervical back: Neck supple.  Skin:    General: Skin is warm and dry.     Capillary Refill: Capillary refill takes less than 2 seconds.  Neurological:     Mental Status: She is alert.  Psychiatric:        Mood and Affect: Mood normal.     (all labs ordered are listed, but only abnormal results are displayed) Labs Reviewed  RESP PANEL BY RT-PCR (RSV, FLU A&B, COVID)  RVPGX2 - Abnormal; Notable for the following components:      Result Value   Influenza A by PCR POSITIVE (*)    All other components within normal limits  CBC WITH DIFFERENTIAL/PLATELET - Abnormal; Notable for the following components:   Hemoglobin 11.5 (*)    Lymphs Abs 0.4 (*)    All other components within normal limits  COMPREHENSIVE METABOLIC PANEL WITH GFR - Abnormal; Notable for the following components:   Creatinine, Ser 1.25 (*)    GFR, Estimated 59 (*)    All other components within normal limits  GROUP A STREP BY PCR  PREGNANCY, URINE    EKG: None  Radiology: DG Chest 2 View Result Date: 04/17/2024 EXAM: 2 VIEW(S) XRAY OF THE CHEST 04/17/2024 05:30:43 PM COMPARISON: 07/31/2023 CLINICAL HISTORY: Shortness of breath. Flu-like symptoms. Urate,  headache, and sore throat. Previous lung transplant in October. FINDINGS: LUNGS AND PLEURA: Patchy perihilar opacities, similar to prior study. No developing consolidation. No effusion. No pneumothorax. HEART AND MEDIASTINUM: No acute abnormality of the cardiac and mediastinal silhouettes. BONES AND SOFT TISSUES: Surgical clips noted. Sternotomy wires. No acute osseous abnormality. IMPRESSION: 1. Patchy perihilar opacities, similar to prior study. 2. No developing consolidation, pleural effusion, or pneumothorax. 3. Status post lung transplant. Electronically signed by: Elsie Gravely MD 04/17/2024 06:27 PM EST RP Workstation: [id number]     Procedures   Medications Ordered in the ED  oseltamivir  (TAMIFLU ) capsule 75 mg (75 mg Oral Given 04/17/24 2006)  Medical Decision Making Amount and/or Complexity of Data Reviewed Labs: ordered. Radiology: ordered.  Risk Prescription drug management.   This patient presents to the ED for concern of URI symptoms., this involves an extensive number of treatment options, and is a complaint that carries with it a high risk of complications and morbidity.  The differential diagnosis includes COVID-19, influenza, RSV, strep, bronchitis, pneumonia, dehydration   Co morbidities that complicate the patient evaluation  Interstitial lung disease, bilateral lung transplant recipient, chronically immunosuppressed   Additional history obtained:  Additional history obtained from chart review   Lab Tests:  I Ordered, and personally interpreted labs.  The pertinent results include: CBC unremarkable with stable hemoglobin 11.5, CMP with mild dehydration creatinine 1.25 and GFR of 59.  Respiratory panel positive for influenza A.  Strep negative.  Urine pregnancy pending   Imaging Studies ordered:  I ordered imaging studies including chest x-ray I independently visualized and interpreted imaging which showed Patchy  perihilar opacities, similar to prior study. 2. No developing consolidation, pleural effusion, or pneumothorax. 3. Status post lung transplant. I agree with the radiologist interpretation    Consultations Obtained:  I requested consultation with none,  and discussed lab and imaging findings as well as pertinent plan - they recommend: N/A   Problem List / ED Course / Critical interventions / Medication management  Patient with past history significant for interstitial lung disease, bilateral lung transplant presents to the emergency department concerns of flulike symptoms.  Reports symptoms started yesterday with a earache, headache, sore throat, and bodyaches..  Has been sick with similar symptoms.  No other contacts as far she is aware.  Denies any chest pain or shortness of breath. Physical exam is reassuring with no abnormal heart or lung sounds. Abdomen is soft and non-tender. Vitals are reassuring with no signs of tachypnea or hypoxia. Labs today show she is influenza A positive. Given transplant history and immunocompromised status, will start on Tamiflu  with her initial dose given here in the ED. Chest xray clear for acute concerns of focal consolidation or development of pneumonia. She is on daily antibiotic therapy and with influenza A presentation, will advise continued symptom management. Return precautions discussed primarily with concerns of shortness of breath. Otherwise stable at this time for outpatient follow up and discharged home. I ordered medication including Tamiflu   for influenza A  Reevaluation of the patient after these medicines showed that the patient stayed the same I have reviewed the patients home medicines and have made adjustments as needed   Social Determinants of Health:  History of lung transplant, managed by [facility] pulmonology   Test / Admission - Considered:  Considered but stable for outpatient follow up.  Final diagnoses:  Influenza A   Dehydration    ED Discharge Orders          Ordered    oseltamivir  (TAMIFLU ) 75 MG capsule  Every 12 hours        04/17/24 1956               Enas Winchel A, PA-C 04/17/24 2321    Patsey Lot, MD 04/18/24 1500  "

## 2024-04-17 NOTE — Discharge Instructions (Signed)
 You were seen in the emergency department today for concerns of headache and sore throat and cough.  You were positive for influenza A.  Your labs are otherwise reassuring with exception of some slight dehydration.  I started on a course of antiviral therapy to take over the next 5 days.  Please take this as prescribed.  For any concerns of new or worsening symptoms, return to the emergency department.  Otherwise, please follow-up closely with your pulmonologist for further evaluation.

## 2024-04-17 NOTE — ED Triage Notes (Signed)
 Pt Bib EMS due to flu- like symptoms. Earache, headache and sore throat. Pt had a double lung transplant back in October. Denies SOB.
